# Patient Record
Sex: Female | Born: 1960 | Race: Black or African American | Hispanic: No | Smoking: Never smoker
Health system: Southern US, Community
[De-identification: ages and names within clinical notes are randomized; demographics above are authoritative.]

## PROBLEM LIST (undated history)

## (undated) DIAGNOSIS — G4730 Sleep apnea, unspecified: Secondary | ICD-10-CM

## (undated) DIAGNOSIS — I82409 Acute embolism and thrombosis of unspecified deep veins of unspecified lower extremity: Secondary | ICD-10-CM

## (undated) DIAGNOSIS — E119 Type 2 diabetes mellitus without complications: Secondary | ICD-10-CM

## (undated) DIAGNOSIS — R7689 Other specified abnormal immunological findings in serum: Secondary | ICD-10-CM

## (undated) DIAGNOSIS — D649 Anemia, unspecified: Secondary | ICD-10-CM

## (undated) DIAGNOSIS — I10 Essential (primary) hypertension: Secondary | ICD-10-CM

## (undated) DIAGNOSIS — Z8719 Personal history of other diseases of the digestive system: Secondary | ICD-10-CM

## (undated) DIAGNOSIS — I2699 Other pulmonary embolism without acute cor pulmonale: Secondary | ICD-10-CM

## (undated) DIAGNOSIS — M1990 Unspecified osteoarthritis, unspecified site: Secondary | ICD-10-CM

## (undated) DIAGNOSIS — M47816 Spondylosis without myelopathy or radiculopathy, lumbar region: Secondary | ICD-10-CM

## (undated) DIAGNOSIS — J45909 Unspecified asthma, uncomplicated: Secondary | ICD-10-CM

## (undated) DIAGNOSIS — M545 Low back pain, unspecified: Secondary | ICD-10-CM

## (undated) DIAGNOSIS — T884XXA Failed or difficult intubation, initial encounter: Secondary | ICD-10-CM

## (undated) DIAGNOSIS — K219 Gastro-esophageal reflux disease without esophagitis: Secondary | ICD-10-CM

## (undated) DIAGNOSIS — R768 Other specified abnormal immunological findings in serum: Secondary | ICD-10-CM

## (undated) DIAGNOSIS — T8859XA Other complications of anesthesia, initial encounter: Secondary | ICD-10-CM

## (undated) HISTORY — PX: HEEL SPUR SURGERY: SHX665

## (undated) HISTORY — PX: TUBAL LIGATION: SHX77

## (undated) HISTORY — PX: BREAST BIOPSY: SHX20

## (undated) HISTORY — PX: CHOLECYSTECTOMY: SHX55

## (undated) HISTORY — PX: TONSILLECTOMY: SUR1361

---

## 1998-12-03 HISTORY — PX: REDUCTION MAMMAPLASTY: SUR839

## 1998-12-03 HISTORY — PX: BREAST SURGERY: SHX581

## 2002-08-12 ENCOUNTER — Other Ambulatory Visit: Admission: RE | Admit: 2002-08-12 | Discharge: 2002-08-12 | Payer: Self-pay | Admitting: Internal Medicine

## 2002-10-28 ENCOUNTER — Encounter: Payer: Self-pay | Admitting: Family Medicine

## 2002-10-28 ENCOUNTER — Encounter: Admission: RE | Admit: 2002-10-28 | Discharge: 2002-10-28 | Payer: Self-pay | Admitting: Family Medicine

## 2003-10-18 ENCOUNTER — Other Ambulatory Visit: Admission: RE | Admit: 2003-10-18 | Discharge: 2003-10-18 | Payer: Self-pay | Admitting: Internal Medicine

## 2005-01-04 ENCOUNTER — Ambulatory Visit: Payer: Self-pay | Admitting: Internal Medicine

## 2005-01-04 ENCOUNTER — Other Ambulatory Visit: Admission: RE | Admit: 2005-01-04 | Discharge: 2005-01-04 | Payer: Self-pay | Admitting: Internal Medicine

## 2005-02-26 ENCOUNTER — Ambulatory Visit: Payer: Self-pay | Admitting: Internal Medicine

## 2006-06-14 ENCOUNTER — Emergency Department: Payer: Self-pay | Admitting: Emergency Medicine

## 2007-03-31 ENCOUNTER — Emergency Department: Payer: Self-pay | Admitting: Emergency Medicine

## 2007-10-08 ENCOUNTER — Ambulatory Visit: Payer: Self-pay | Admitting: Family Medicine

## 2007-12-04 HISTORY — PX: ROTATOR CUFF REPAIR: SHX139

## 2008-04-02 ENCOUNTER — Ambulatory Visit: Payer: Self-pay | Admitting: Orthopaedic Surgery

## 2008-04-23 ENCOUNTER — Encounter: Payer: Self-pay | Admitting: Internal Medicine

## 2010-01-31 ENCOUNTER — Emergency Department: Payer: Self-pay | Admitting: Emergency Medicine

## 2011-01-06 ENCOUNTER — Emergency Department: Payer: Self-pay | Admitting: Emergency Medicine

## 2011-07-10 ENCOUNTER — Ambulatory Visit: Payer: Self-pay | Admitting: Family Medicine

## 2012-04-09 ENCOUNTER — Inpatient Hospital Stay: Payer: Self-pay | Admitting: Internal Medicine

## 2012-04-09 LAB — COMPREHENSIVE METABOLIC PANEL
Albumin: 3.4 g/dL (ref 3.4–5.0)
Alkaline Phosphatase: 98 U/L (ref 50–136)
Anion Gap: 10 (ref 7–16)
BUN: 10 mg/dL (ref 7–18)
Bilirubin,Total: 0.5 mg/dL (ref 0.2–1.0)
Calcium, Total: 9 mg/dL (ref 8.5–10.1)
Chloride: 102 mmol/L (ref 98–107)
Co2: 29 mmol/L (ref 21–32)
Creatinine: 0.88 mg/dL (ref 0.60–1.30)
EGFR (African American): >60
EGFR (Non-African Amer.): >60
Glucose: 152 mg/dL — ABNORMAL HIGH (ref 65–99)
Osmolality: 283 (ref 275–301)
Potassium: 3.2 mmol/L — ABNORMAL LOW (ref 3.5–5.1)
SGOT(AST): 40 U/L — ABNORMAL HIGH (ref 15–37)
SGPT (ALT): 38 U/L
Sodium: 141 mmol/L (ref 136–145)
Total Protein: 8.1 g/dL (ref 6.4–8.2)

## 2012-04-09 LAB — CBC WITH DIFFERENTIAL/PLATELET
Basophil #: 0 10*3/uL (ref 0.0–0.1)
Basophil %: 0.2 %
Eosinophil #: 0 10*3/uL (ref 0.0–0.7)
Eosinophil %: 0 %
HCT: 34.9 % — ABNORMAL LOW (ref 35.0–47.0)
HGB: 10.9 g/dL — ABNORMAL LOW (ref 12.0–16.0)
Lymphocyte #: 1.1 10*3/uL (ref 1.0–3.6)
Lymphocyte %: 5.3 %
MCH: 27.4 pg (ref 26.0–34.0)
MCHC: 31.4 g/dL — ABNORMAL LOW (ref 32.0–36.0)
MCV: 87 fL (ref 80–100)
Monocyte #: 1.3 x10 3/mm — ABNORMAL HIGH (ref 0.2–0.9)
Monocyte %: 6.2 %
Neutrophil #: 18.2 10*3/uL — ABNORMAL HIGH (ref 1.4–6.5)
Neutrophil %: 88.3 %
Platelet: 232 10*3/uL (ref 150–440)
RBC: 3.99 10*6/uL (ref 3.80–5.20)
RDW: 16.2 % — ABNORMAL HIGH (ref 11.5–14.5)
WBC: 20.6 10*3/uL — ABNORMAL HIGH (ref 3.6–11.0)

## 2012-04-10 LAB — CBC WITH DIFFERENTIAL/PLATELET
Basophil #: 0.1 10*3/uL (ref 0.0–0.1)
Basophil %: 0.4 %
Eosinophil #: 0 10*3/uL (ref 0.0–0.7)
Eosinophil %: 0 %
HCT: 33.3 % — ABNORMAL LOW (ref 35.0–47.0)
HGB: 10.5 g/dL — ABNORMAL LOW (ref 12.0–16.0)
Lymphocyte #: 1.1 10*3/uL (ref 1.0–3.6)
Lymphocyte %: 4 %
MCH: 27.4 pg (ref 26.0–34.0)
MCHC: 31.5 g/dL — ABNORMAL LOW (ref 32.0–36.0)
MCV: 87 fL (ref 80–100)
Monocyte #: 0.7 x10 3/mm (ref 0.2–0.9)
Monocyte %: 2.4 %
Neutrophil #: 25.7 10*3/uL — ABNORMAL HIGH (ref 1.4–6.5)
Neutrophil %: 93.2 %
Platelet: 249 10*3/uL (ref 150–440)
RBC: 3.83 10*6/uL (ref 3.80–5.20)
RDW: 16.4 % — ABNORMAL HIGH (ref 11.5–14.5)
WBC: 27.5 10*3/uL — ABNORMAL HIGH (ref 3.6–11.0)

## 2012-04-10 LAB — BASIC METABOLIC PANEL
Anion Gap: 9 (ref 7–16)
BUN: 15 mg/dL (ref 7–18)
Calcium, Total: 8.9 mg/dL (ref 8.5–10.1)
Chloride: 105 mmol/L (ref 98–107)
Co2: 26 mmol/L (ref 21–32)
Creatinine: 0.85 mg/dL (ref 0.60–1.30)
EGFR (African American): >60
EGFR (Non-African Amer.): >60
Glucose: 164 mg/dL — ABNORMAL HIGH (ref 65–99)
Osmolality: 284 (ref 275–301)
Potassium: 3.8 mmol/L (ref 3.5–5.1)
Sodium: 140 mmol/L (ref 136–145)

## 2012-04-11 LAB — CBC WITH DIFFERENTIAL/PLATELET
Basophil #: 0 10*3/uL (ref 0.0–0.1)
Basophil %: 0.1 %
Eosinophil #: 0 10*3/uL (ref 0.0–0.7)
Eosinophil %: 0 %
HCT: 33.6 % — ABNORMAL LOW (ref 35.0–47.0)
HGB: 10.4 g/dL — ABNORMAL LOW (ref 12.0–16.0)
Lymphocyte #: 1.6 10*3/uL (ref 1.0–3.6)
Lymphocyte %: 6.7 %
MCH: 26.9 pg (ref 26.0–34.0)
MCHC: 31.1 g/dL — ABNORMAL LOW (ref 32.0–36.0)
MCV: 87 fL (ref 80–100)
Monocyte #: 0.6 x10 3/mm (ref 0.2–0.9)
Monocyte %: 2.7 %
Neutrophil #: 21.3 10*3/uL — ABNORMAL HIGH (ref 1.4–6.5)
Neutrophil %: 90.5 %
Platelet: 261 10*3/uL (ref 150–440)
RBC: 3.88 10*6/uL (ref 3.80–5.20)
RDW: 16.2 % — ABNORMAL HIGH (ref 11.5–14.5)
WBC: 23.5 10*3/uL — ABNORMAL HIGH (ref 3.6–11.0)

## 2012-06-04 ENCOUNTER — Ambulatory Visit: Payer: Self-pay | Admitting: Family Medicine

## 2012-07-03 ENCOUNTER — Ambulatory Visit: Payer: Self-pay | Admitting: Family Medicine

## 2012-07-10 ENCOUNTER — Ambulatory Visit: Payer: Self-pay | Admitting: Family Medicine

## 2012-08-03 ENCOUNTER — Ambulatory Visit: Payer: Self-pay | Admitting: Family Medicine

## 2012-08-21 ENCOUNTER — Emergency Department: Payer: Self-pay | Admitting: Emergency Medicine

## 2012-08-23 LAB — BETA STREP CULTURE(ARMC)

## 2012-09-17 ENCOUNTER — Ambulatory Visit: Payer: Self-pay | Admitting: Family Medicine

## 2012-10-03 ENCOUNTER — Ambulatory Visit: Payer: Self-pay | Admitting: Family Medicine

## 2012-11-19 ENCOUNTER — Ambulatory Visit: Payer: Self-pay | Admitting: Family Medicine

## 2012-12-03 ENCOUNTER — Ambulatory Visit: Payer: Self-pay | Admitting: Family Medicine

## 2013-02-18 ENCOUNTER — Ambulatory Visit: Payer: Self-pay | Admitting: Family Medicine

## 2013-03-03 ENCOUNTER — Ambulatory Visit: Payer: Self-pay | Admitting: Family Medicine

## 2013-04-02 ENCOUNTER — Ambulatory Visit: Payer: Self-pay | Admitting: Family Medicine

## 2013-04-20 ENCOUNTER — Emergency Department: Payer: Self-pay | Admitting: Emergency Medicine

## 2013-04-22 LAB — BETA STREP CULTURE(ARMC)

## 2013-05-04 ENCOUNTER — Ambulatory Visit: Payer: Self-pay | Admitting: Otolaryngology

## 2013-05-04 DIAGNOSIS — Z01810 Encounter for preprocedural cardiovascular examination: Secondary | ICD-10-CM

## 2013-05-04 LAB — BASIC METABOLIC PANEL
Anion Gap: 3 — ABNORMAL LOW (ref 7–16)
BUN: 12 mg/dL (ref 7–18)
Calcium, Total: 8.9 mg/dL (ref 8.5–10.1)
Chloride: 107 mmol/L (ref 98–107)
Co2: 30 mmol/L (ref 21–32)
Creatinine: 0.79 mg/dL (ref 0.60–1.30)
EGFR (African American): >60
EGFR (Non-African Amer.): >60
Glucose: 100 mg/dL — ABNORMAL HIGH (ref 65–99)
Osmolality: 279 (ref 275–301)
Potassium: 3.6 mmol/L (ref 3.5–5.1)
Sodium: 140 mmol/L (ref 136–145)

## 2013-05-04 LAB — HEMOGLOBIN: HGB: 11.8 g/dL — ABNORMAL LOW (ref 12.0–16.0)

## 2013-05-05 LAB — PATHOLOGY REPORT

## 2013-06-10 ENCOUNTER — Ambulatory Visit: Payer: Self-pay | Admitting: Family Medicine

## 2013-07-03 ENCOUNTER — Ambulatory Visit: Payer: Self-pay | Admitting: Family Medicine

## 2013-08-12 ENCOUNTER — Ambulatory Visit: Payer: Self-pay | Admitting: Otolaryngology

## 2013-09-22 ENCOUNTER — Ambulatory Visit: Payer: Self-pay | Admitting: Family Medicine

## 2013-10-19 ENCOUNTER — Ambulatory Visit: Payer: Self-pay | Admitting: Family Medicine

## 2013-11-02 ENCOUNTER — Ambulatory Visit: Payer: Self-pay | Admitting: Family Medicine

## 2013-12-03 ENCOUNTER — Ambulatory Visit: Payer: Self-pay | Admitting: Family Medicine

## 2013-12-07 DIAGNOSIS — G5600 Carpal tunnel syndrome, unspecified upper limb: Secondary | ICD-10-CM | POA: Insufficient documentation

## 2013-12-07 DIAGNOSIS — J45909 Unspecified asthma, uncomplicated: Secondary | ICD-10-CM | POA: Insufficient documentation

## 2014-01-03 ENCOUNTER — Ambulatory Visit: Payer: Self-pay | Admitting: Family Medicine

## 2014-02-12 ENCOUNTER — Ambulatory Visit: Payer: Self-pay | Admitting: Family Medicine

## 2014-03-03 ENCOUNTER — Ambulatory Visit: Payer: Self-pay | Admitting: Family Medicine

## 2014-06-22 ENCOUNTER — Ambulatory Visit: Payer: Self-pay | Admitting: Family Medicine

## 2014-07-03 ENCOUNTER — Ambulatory Visit: Payer: Self-pay | Admitting: Family Medicine

## 2014-08-03 ENCOUNTER — Ambulatory Visit: Payer: Self-pay | Admitting: Family Medicine

## 2014-09-02 ENCOUNTER — Ambulatory Visit: Payer: Self-pay | Admitting: Family Medicine

## 2014-10-07 ENCOUNTER — Ambulatory Visit: Payer: Self-pay | Admitting: Family Medicine

## 2014-11-02 ENCOUNTER — Ambulatory Visit: Payer: Self-pay | Admitting: Family Medicine

## 2014-12-13 ENCOUNTER — Ambulatory Visit: Payer: Self-pay | Admitting: Family Medicine

## 2015-01-03 ENCOUNTER — Ambulatory Visit: Payer: Self-pay | Admitting: Family Medicine

## 2015-01-15 DIAGNOSIS — I82409 Acute embolism and thrombosis of unspecified deep veins of unspecified lower extremity: Secondary | ICD-10-CM

## 2015-01-15 DIAGNOSIS — I2699 Other pulmonary embolism without acute cor pulmonale: Secondary | ICD-10-CM

## 2015-01-15 HISTORY — DX: Acute embolism and thrombosis of unspecified deep veins of unspecified lower extremity: I82.409

## 2015-01-15 HISTORY — DX: Other pulmonary embolism without acute cor pulmonale: I26.99

## 2015-03-25 NOTE — Op Note (Signed)
PATIENT NAME:  Erica Powers, Erica Powers MR#:  [id number] DATE OF BIRTH:  1961-11-08  DATE OF PROCEDURE:  05/04/2013  PREOPERATIVE DIAGNOSIS: History of chronic tonsillitis.   POSTOPERATIVE DIAGNOSIS: History of chronic tonsillitis.  PROCEDURE PERFORMED: Tonsillectomy, greater than age 54.   SURGEON: Bud Facereighton Keighley Deckman, M.D.   ANESTHESIA: General endotracheal anesthesia.   ESTIMATED BLOOD LOSS: 50 mL.   IV FLUIDS: Please see anesthesia record.   COMPLICATIONS: None.   DRAINS/STENT PLACEMENTS: None.   SPECIMENS: Right and left tonsil.   INDICATIONS FOR PROCEDURE: The patient is a 54 year old female with history of chronic tonsillitis and intermittent infections resistant to medical management.   OPERATIVE FINDINGS: Severe fibrosis and erythema of the tonsils bilaterally with moderate amount of fibrotic scarring to the underlying musculature.   DESCRIPTION OF PROCEDURE: After the patient was identified in holding, benefits and risks of the procedure were discussed and consent was reviewed, the patient was taken to the operating room and placed in the supine position. General endotracheal anesthesia was induced. The patient was rotated 45 degrees. A shoulder roll was placed and a McIvor mouth gag was inserted in the patient's oral cavity and suspended from Mayo stand. A red rubber catheter was placed in the patient's right nasal cavity for retraction of uvula and soft palate superiorly. At this time, visualization of the patient's tonsils was made. This demonstrated significantly hypertrophied tonsils with erythema. A curved Allis clamp was attached to the superior pole of the patient's right tonsil and this was retracted medially and inferiorly. There was a significant amount of granulation tissue and inflammation deep to that between the tonsil and the underlying musculature. There is no true subcapsular plane, but a plane was developed between inflammation tissue and the underlying musculature.  Meticulous hemostasis was achieved and the patient's right tonsil was excised and sent off for permanent pathological evaluation.   Attention was directed to the patient's left tonsil. After releasing the McIvor mouth gag for approximately 1 minute,  the patient's left tonsil was retracted medially and inferiorly with a curved Allis clamp. Again, severe inflammation was noted with inflammation tissue between the tonsil and the underlying musculature and Bovie electrocautery was used to meticulously dissect away from the underlying musculature. Bovie suction cautery was used for suctioning and meticulous hemostasis of the bilateral tonsillar beds. At this time, the patient was released from suspension for approximately 1 minute. Visualization of the patient's tonsillar fossa was made. Repeat cauterization was made in small areas of bleeding involving the left lateral tonsil and right inferior. Attention was directed to the patient's adenoid tissue. There was no significant adenoid tissue, but there was a narrow choana. Therefore, Vicryls were placed in between the posterior tonsillar beds and 2 Vicryl stitches were used to open up the patient's airway. At this time, care of the patient was transferred to anesthesia.   ____________________________ Kyung Ruddreighton C. Sesilia Poucher, MD ccv:aw D: 05/04/2013 10:11:25 ET T: 05/04/2013 10:55:55 ET JOB#: [id number]  cc: Kyung Ruddreighton C. Jahfari Ambers, MD, <Dictator> Kyung RuddREIGHTON C Shaquil Aldana MD ELECTRONICALLY SIGNED 05/14/2013 7:48

## 2015-03-25 NOTE — Discharge Summary (Signed)
Dates of Admission and Diagnosis:  Date of Admission 04-May-2013   Date of Discharge 05-May-2013   Admitting Diagnosis s/p tonsillectomy   Final Diagnosis s/p tonsillectomy   Discharge Diagnosis 1 s/p tonsillectomy    Chief Complaint/History of Present Illness 54 y.o. female with history of chronic tonsillitis and severe inflammation of tonsils admitted following tonsillectomy on 05/04/13.   Routine Chem:  02-Jun-14 08:42   Glucose, Serum  100  BUN 12  Creatinine (comp) 0.79  Sodium, Serum 140  Potassium, Serum 3.6  Chloride, Serum 107  CO2, Serum 30  Calcium (Total), Serum 8.9  Anion Gap  3  Osmolality (calc) 279  eGFR (African American) >60  eGFR (Non-African American) >60 (eGFR values <39m/min/1.73 m2 may be an indication of chronic kidney disease (CKD). Calculated eGFR is useful in patients with stable renal function. The eGFR calculation will not be reliable in acutely ill patients when serum creatinine is changing rapidly. It is not useful in  patients on dialysis. The eGFR calculation may not be applicable to patients at the low and high extremes of body sizes, pregnant women, and vegetarians.)  Routine Hem:  02-Jun-14 08:42   Hemoglobin (CBC)  11.8 ([facility]) [facility] following procedure for pain control and fluid hydration.  Tolerated diet well.  Ambulated.  Pain controlled with oral medications.   Condition on Discharge Good   DISCHARGE INSTRUCTIONS HOME MEDS:  Medication Reconciliation: Patient's Home Medications at Discharge:     Medication Instructions  phentermine 30 mg oral capsule  1 cap(s) orally once a day   hydrochlorothiazide 50 mg oral tablet  1 tab(s) orally once a day   oxycodone 5 mg/5 ml oral solution  10 milliliter(s) orally every 4 hours, As needed, pain   promethazine  12.5 milligram(s) PO every 4 hours, As Needed, nausea, vomiting , As needed, nausea,  vomiting   lidocaine topical  10 milliliter(s) orally every 6 hours, As needed, pain.  Swish and spit.    PRESCRIPTIONS: PRINTED AND PLACED ON CHART   Physician's Instructions:  Home Health? No   Treatments None   Home Oxygen? No   Diet Regular   Diet Consistency Mechanical Soft   Activity Limitations No exertional activity  No heavy lifting  for 2 weeks   Return to Work 2 weeks   Time frame for Follow Up Appointment follow up in 3 weeks with [facility]   Electronic Signatures: Odalis Jordan, CShela Leff(MD)  (Signed 03-Jun-14 07:31)  Authored: ADMISSION DATE AND DIAGNOSIS, CHIEF COMPLAINT/HPI, PERTINENT LLaurieMEDS, PATIENT INSTRUCTIONS   Last Updated: 03-Jun-14 07:31 by VPascal Lux(MD)

## 2015-03-27 NOTE — Consult Note (Signed)
Brief Consult Note: Diagnosis: Tonsillitis.   Patient was seen by consultant.   Consult note dictated.   Recommend further assessment or treatment.   Comments: 54 y.o. female presented to ED with 3 day history of sore throat progressing with some difficulty breathing.  Admitted from ED after given Rocephin and Solumedrol/Decadron.  Reports significant improvement with no problems when supine. CT- tonsillar hypertrophy, no fluid collection, airway patent PE: Gen- NAD, airway patent Ears- canals patent Nose- clear anteriorly, boggy mucosa, clear rhinorrhea OC/OP- tonsillar hypertrophy and erythema with exudate bilaterally, airway patent Neck- +LAD  Impression:  Tonsillitis Plan:  Agree with Solumedrol and Rocephin.  Switch to Augmentin and Sterapred DS 6 day on discharge.  Rec follow up with me as outpatient in 10-14 days for evaluation of tonsils to ensure resolution.  Patient may need tonsillectomy if this occurs again.  Electronic Signatures: Kyrus Hyde, Rayfield Citizenreighton Charles (MD)  (Signed [phone]-May-13 17:25)  Authored: Brief Consult Note   Last Updated: 08-May-13 17:25 by Flossie DibbleVaught, Daana Petrasek Charles (MD)

## 2015-03-27 NOTE — Consult Note (Signed)
PATIENT NAME:  Erica Powers, Erica Powers MR#:  [id number] DATE OF BIRTH:  [birth year]  DATE OF CONSULTATION:  04/09/2012  REFERRING PHYSICIAN:  Dr. Rudene Rearwish  CONSULTING PHYSICIAN:  Kyung Ruddreighton C. Sim Choquette, MD   REASON FOR CONSULTATION: Tonsillitis.   HISTORY OF PRESENT ILLNESS: The patient is a 54 year old female who presented to the Emergency Room on 04/09/2012 with a three day history of progressive odynophagia as well as increasing shortness of breath with laying supine. She was evaluated and noted to have extremely erythematous and enlarged tonsils. She underwent a CT scan which did not reveal any signs of abscess. She was admitted to the floor and underwent treatment with Rocephin as well as Solu-Medrol and reports that she has had significant improvement. She no longer has any respiratory problems laying supine. She is able to tolerate p.o. liquids well and her pain has significantly improved.   PAST MEDICAL HISTORY:  1. Hypertension.  2. Iron deficiency anemia.   PAST SURGICAL HISTORY:  1. Cesarean section x2. 2. Foot surgery.  3. Breast reduction.   SOCIAL HISTORY: The patient denies any significant tobacco or alcohol use.   FAMILY HISTORY: Congestive heart failure.   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   ALLERGIES: Codeine.   CURRENT MEDICATIONS: Reviewed and documented in the electronic medical record.     PHYSICAL EXAMINATION:   GENERAL: Well nourished, well developed female laying supine in no acute distress. There is no stridor.   EARS: EACs are clear.   NOSE: Clear anteriorly.   ORAL CAVITY AND OROPHARYNX: 3+ hypertrophied tonsils with mild erythema and exudate. Airway is widely patent.   NECK: Some mild lymphadenopathy bilaterally but no fluid collection or abscess.   RADIOLOGICAL DATA: CT scan is reviewed which revealed enlarged edematous tonsils but no fluid collection.   IMPRESSION: Tonsillitis and odynophagia with resolved respiratory  problems.   PLAN: I discussed my findings with the patient. I've recommended continuing the Rocephin as well as Solu-Medrol until tomorrow and then when she is discharged switch to Augmentin and oral Sterapred six day taper. I would like to see her in my office in 10 to 14 days for repeat evaluation.   ____________________________ Kyung Ruddreighton C. Philopateer Strine, MD ccv:drc D: 04/09/2012 18:23:42 ET T: 04/10/2012 09:54:17 ET JOB#: [id number]  cc: Kyung Ruddreighton C. Emerson Schreifels, MD, <Dictator> Kyung RuddREIGHTON C Rilley Stash MD ELECTRONICALLY SIGNED 04/11/2012 13:43

## 2015-03-27 NOTE — Discharge Summary (Signed)
PATIENT NAME:  Erica Powers, Erica Powers MR#:  [id number] DATE OF BIRTH:  1961/04/03  DATE OF ADMISSION:  04/09/2012 DATE OF DISCHARGE:  04/11/2012  PRIMARY CARE PHYSICIAN: [name], MD  FINAL DIAGNOSES:  1. Acute tonsillitis with dysphagia, difficulty speaking, and shortness of breath.  2. Hypertension.  3. Asthma.  4. Obesity.  5. Hypokalemia.   DISCHARGE MEDICATIONS: 1. Ferrous sulfate daily.  2. Advair Diskus 110/50 one inhalation twice a day.  3. ProAir 2 puffs every 4 to 6 hours.  4. Flonase two sprays daily.  5. Loratadine 10 mg daily.  6. Hydrochlorothiazide 12.5 mg daily.  7. Augmentin 875 mg twice a day until completed.  8. Prednisone taper as written on prescription until completed.  9. Ambien 10 mg p.o. nightly as needed for sleep.   DIET: Low sodium diet.   ACTIVITY:  As tolerated.   DISCHARGE FOLLOWUP: Followup with Dr. [name], ENT, in 1 to 2 weeks. Followup with Dr. [name] in 1 to 2 weeks.   REASON FOR ADMISSION: The patient was admitted 04/09/2012 and discharged 04/11/2012. She came in with severe throat pain, getting worse and associated with shortness of breath.  HISTORY OF PRESENT ILLNESS: This is a Erica Powers who had scratchy throat and fever, took some ibuprofen, increasing swelling in her neck. She was given a penicillin shot at her primary care physician's then had difficulty breathing. Hospitalist services were contacted for admission for severe tonsillitis, dysphagia, difficulty speaking, and shortness of breath. She also had leukocytosis and hypokalemia. She was given IV Rocephin and IV Solu-Medrol. ENT consultation was done by Dr. [name] who recommend a follow-up appointment in his office in 7 to 10 days.   LABORATORY/RADIOLOGIC DATA: Glucose 152, BUN 10, creatinine 0.88, sodium 141, potassium 3.2, chloride 102, CO2 29, and calcium 9.0. Liver function tests normal. White blood cell count 20.6, hemoglobin and hematocrit 10.9 and 34.9, and  platelet count 232.  CT scan of the neck showed edematous hyper-enhancing tonsillar pillars, may be infectious or inflammatory.   White blood cell count upon discharge 23.5.   HOSPITAL COURSE PER PROBLEM LIST:  1. For the acute tonsillitis with dysphagia and difficulty speaking and shortness of breath, this had improved. She was able to swallow. Her breathing got better with steroid treatment and Rocephin. The patient was discharged home on prednisone taper and Augmentin and followup with ENT as an outpatient.  2. For her hypotension, I lowered the dose on the hydrochlorothiazide. Blood pressure upon discharge was 136/70. 3. For asthma she is on her normal Advair and Pro Air. Steroids will help out with breathing anyway, but there was no wheezing during the hospital stay.  4. For her obesity, BMI is 43.3. Weight loss recommended.  5. For hypokalemia, this was replaced and can be worsened with the hydrochlorothiazide.             DISPOSITION: The patient was discharged home in stable condition. The patient did still have swelling there and a whitish exudate on the tonsil, but able to breathe well and swallow okay. The patient is discharged home.   TIME SPENT ON DISCHARGE: 35 minutes.  ____________________________ Herschell Powers J. Erica GlossWieting, MD rjw:slb D: 04/11/2012 16:33:55 ET T: 04/14/2012 12:45:42 ET JOB#: [id number]  cc: Herschell Powers J. Erica GlossWieting, MD, <Dictator> Erica "Sallie" Allena KatzPatel, MD Erica Ruddreighton C. Vaught, MD Erica ScarletICHARD J Erica Gallo MD ELECTRONICALLY SIGNED 04/20/2012 14:41

## 2015-03-27 NOTE — H&P (Signed)
PATIENT NAME:  Erica Powers, Erica Powers MR#:  [id number] DATE OF BIRTH:  05-Feb-1961  DATE OF ADMISSION:  04/09/2012  PRIMARY CARE PHYSICIAN: Tillman Abideichard Letvak, MD  CHIEF COMPLAINT: Severe sore throat that is getting worse and associated with shortness of breath.   HISTORY OF PRESENT ILLNESS: Erica Powers is a 54 year old pleasant African American female who was in her usual state of health until yesterday when she developed scratchy throat associated with fever. She took ibuprofen and then when she woke up later she felt what she described as real bad in terms of increasing pain and swelling in her neck. She decided to go to her primary care physician and she reported she was given a penicillin shot and she went home. However, at 10 o'clock at night, her situation worsened, and at this point she developed difficulty to breathe in that she had to keep her head backwards in order to breathe. That is to keep the head in extension. She reported chills and hot feeling. The patient came to the emergency department and was admitted for further treatment.   REVIEW OF SYSTEMS: CONSTITUTIONAL: Reports fever and chills. No fatigue. EYES: No blurring of vision. No double vision. ENT: No hearing impairment, but she has severe sore throat, dysphagia, and odynophagia. CARDIOVASCULAR: No chest pain, but she developed shortness of breath after the severe sore throat. No syncope. RESPIRATORY: No cough. No sputum production. No chest pain. No hemoptysis. GASTROINTESTINAL: No abdominal pain. No nausea, no vomiting, and no diarrhea. GENITOURINARY: No dysuria. No frequency of urination. MUSCULOSKELETAL: No joint pain or swelling. No muscular pain or swelling. INTEGUMENTARY: No skin rash. No ulcers. NEUROLOGY: No focal weakness. No seizure activity. No headache. PSYCHIATRY: No anxiety or depression. ENDOCRINE: No polyuria or polydipsia. No heat or cold intolerance.   PAST MEDICAL HISTORY:  1. Systemic hypertension. 2. Iron deficiency  anemia.   PAST SURGICAL HISTORY:  1. Cesarean section. 2. Foot spur surgery.   SOCIAL HABITS: Nonsmoker. No history of alcohol or drug abuse.   FAMILY HISTORY: Her mother died from complications of congestive heart failure. Her father died at an old age with no specific medical problems.   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   ADMISSION MEDICATIONS:  1. Hydrochlorothiazide 1 tablet a day.  2. Ibuprofen p.r.n.   ALLERGIES: Codeine causes itching.   PHYSICAL EXAMINATION:   VITAL SIGNS: Blood pressure 140/70, respiratory rate 18, pulse 74, temperature 98.5, and oxygen saturation 98%.   GENERAL APPEARANCE: Middle-aged female lying in bed in no acute distress. She started to feel better after initial treatment in the emergency department.  HEAD/NECK: No pallor. No icterus. No cyanosis.  EARS, NOSE, AND THROAT:  Hearing was normal. Nasal mucosa, lips, and tongue were normal. The posterior pharyngeal area shows soft tissue swelling and extensive exudate in the posterior pharyngeal area. The uvula is at midline.   NECK: Supple. Trachea at midline. No thyromegaly. No cervical lymphadenopathy. No masses.   HEART: Normal S1 and S2. No S3 or S4. No murmur. No gallop. No carotid bruits.   RESPIRATORY: Normal breathing pattern without use of accessory muscles. No rales. No wheezing.   ABDOMEN: Soft without tenderness. No hepatosplenomegaly. No masses. No hernias.   SKIN: No ulcers. No subcutaneous nodules.   MUSCULOSKELETAL: No joint swelling. No clubbing.   NEUROLOGIC: Cranial nerves II through XII are intact. No focal motor deficit.   PSYCHIATRY: The patient is alert and oriented x3. Mood and affect were normal.  LABS/STUDIES: CAT scan of the neck revealed enlarged and edematous palatine tonsils consistent with tonsillitis. No drainable abscess formation.   CBC showed a white count of 20,000, hemoglobin 10, hematocrit 34, and platelet count 232. Liver  function tests were normal, except for slight elevation of AST. Serum glucose 152, BUN 10, creatinine 0.8, sodium 141, and potassium 3.2.   ASSESSMENT AND PLAN:  1. Severe tonsillitis. 2. Dysphagia and odynophagia secondary to soft tissue neck swelling. 3. Increased shortness of breath in association with acute tonsillitis and soft tissue swelling narrowing airway passages.  4. Leukocytosis secondary to the infection.  5. Hypokalemia.  6. Noted elevated blood sugar. The patient may have underlying diabetes. This needs to be monitored since steroid was initiated.  7. Systemic hypertension.   PLAN: The patient will be admitted. Check vital signs more frequently. IV Solu-Medrol to reduce the soft tissue inflammatory response. IV antibiotic; she received Zosyn in the emergency department. I will change that to Rocephin 1 gram daily. ENT consult was obtained. Tylenol p.r.n. Monitor blood sugar while the patient is on steroids. Potassium replacement.   TIME SPENT IN EVALUATING THIS PATIENT: More than 45 minutes.  ____________________________ Carney Corners. Rudene Re, MD amd:slb D: 04/09/2012 06:50:23 ET     T: 04/09/2012 09:37:27 ET        JOB#: [id number] cc: Carney Corners. Rudene Re, MD, <Dictator> Karie Schwalbe, MD Karolee Ohs Dala Dock MD ELECTRONICALLY SIGNED 04/09/2012 22:20

## 2015-03-30 ENCOUNTER — Encounter: Payer: Self-pay | Admitting: *Deleted

## 2015-04-07 NOTE — Discharge Instructions (Signed)

## 2015-04-08 ENCOUNTER — Ambulatory Visit
Admission: RE | Admit: 2015-04-08 | Discharge: 2015-04-08 | Disposition: A | Discharge status: Home / Self Care | Payer: BLUE CROSS/BLUE SHIELD | Source: Ambulatory Visit | Attending: Gastroenterology | Admitting: Gastroenterology

## 2015-04-08 ENCOUNTER — Ambulatory Visit: Payer: BLUE CROSS/BLUE SHIELD | Admitting: Anesthesiology

## 2015-04-08 ENCOUNTER — Other Ambulatory Visit: Payer: Self-pay | Admitting: Gastroenterology

## 2015-04-08 ENCOUNTER — Encounter
Admission: RE | Disposition: A | Discharge status: Home / Self Care | Payer: Self-pay | Source: Ambulatory Visit | Attending: Gastroenterology

## 2015-04-08 DIAGNOSIS — Z9889 Other specified postprocedural states: Secondary | ICD-10-CM | POA: Insufficient documentation

## 2015-04-08 DIAGNOSIS — Z79899 Other long term (current) drug therapy: Secondary | ICD-10-CM | POA: Diagnosis not present

## 2015-04-08 DIAGNOSIS — J45909 Unspecified asthma, uncomplicated: Secondary | ICD-10-CM | POA: Insufficient documentation

## 2015-04-08 DIAGNOSIS — K5730 Diverticulosis of large intestine without perforation or abscess without bleeding: Secondary | ICD-10-CM | POA: Insufficient documentation

## 2015-04-08 DIAGNOSIS — K640 First degree hemorrhoids: Secondary | ICD-10-CM | POA: Insufficient documentation

## 2015-04-08 DIAGNOSIS — K635 Polyp of colon: Secondary | ICD-10-CM | POA: Insufficient documentation

## 2015-04-08 DIAGNOSIS — Z1211 Encounter for screening for malignant neoplasm of colon: Secondary | ICD-10-CM | POA: Diagnosis not present

## 2015-04-08 HISTORY — PX: COLONOSCOPY: SHX5424

## 2015-04-08 HISTORY — DX: Unspecified asthma, uncomplicated: J45.909

## 2015-04-08 SURGERY — COLONOSCOPY
Anesthesia: Monitor Anesthesia Care | Wound class: Contaminated

## 2015-04-08 MED ORDER — ACETAMINOPHEN 160 MG/5ML PO SOLN: 325.0000 mg | ORAL | Status: DC | PRN

## 2015-04-08 MED ORDER — LACTATED RINGERS IV SOLN
INTRAVENOUS | Status: DC
  Administered 2015-04-08: 08:00:00 via INTRAVENOUS

## 2015-04-08 MED ORDER — LACTATED RINGERS IV SOLN: INTRAVENOUS | Status: DC

## 2015-04-08 MED ORDER — LIDOCAINE HCL (CARDIAC) 20 MG/ML IV SOLN
INTRAVENOUS | Status: DC | PRN
  Administered 2015-04-08: 50 mg via INTRAVENOUS

## 2015-04-08 MED ORDER — STERILE WATER FOR IRRIGATION IR SOLN
Status: DC | PRN
  Administered 2015-04-08: 09:00:00

## 2015-04-08 MED ORDER — PROPOFOL 10 MG/ML IV BOLUS
INTRAVENOUS | Status: DC | PRN
  Administered 2015-04-08: 50 mg via INTRAVENOUS
  Administered 2015-04-08: 20 mg via INTRAVENOUS
  Administered 2015-04-08: 50 mg via INTRAVENOUS
  Administered 2015-04-08: 30 mg via INTRAVENOUS
  Administered 2015-04-08: 20 mg via INTRAVENOUS
  Administered 2015-04-08: 50 mg via INTRAVENOUS

## 2015-04-08 MED ORDER — SODIUM CHLORIDE 0.9 % IV SOLN: INTRAVENOUS | Status: DC

## 2015-04-08 MED ORDER — ACETAMINOPHEN 325 MG PO TABS: 325.0000 mg | ORAL_TABLET | ORAL | Status: DC | PRN

## 2015-04-08 SURGICAL SUPPLY — 27 items
CANISTER SUCT 1200ML W/VALVE (MISCELLANEOUS) ×3 IMPLANT
FCP ESCP3.2XJMB 240X2.8X (MISCELLANEOUS) ×1
FORCEPS BIOP RAD 4 LRG CAP 4 (CUTTING FORCEPS) IMPLANT
FORCEPS BIOP RJ4 240 W/NDL (MISCELLANEOUS) ×3
FORCEPS ESCP3.2XJMB 240X2.8X (MISCELLANEOUS) IMPLANT
GOWN CVR UNV OPN BCK APRN NK (MISCELLANEOUS) ×2 IMPLANT
GOWN ISOL THUMB LOOP REG UNIV (MISCELLANEOUS) ×6
HEMOCLIP INSTINCT (CLIP) IMPLANT
INJECTOR VARIJECT VIN23 (MISCELLANEOUS) IMPLANT
KIT CO2 TUBING (TUBING) ×3 IMPLANT
KIT DEFENDO VALVE AND CONN (KITS) IMPLANT
KIT ENDO PROCEDURE OLY (KITS) ×3 IMPLANT
LIGATOR MULTIBAND 6SHOOTER MBL (MISCELLANEOUS) IMPLANT
MARKER SPOT ENDO TATTOO 5ML (MISCELLANEOUS) IMPLANT
PAD GROUND ADULT SPLIT (MISCELLANEOUS) IMPLANT
SNARE SHORT THROW 13M SML OVAL (MISCELLANEOUS) IMPLANT
SNARE SHORT THROW 30M LRG OVAL (MISCELLANEOUS) IMPLANT
SPOT EX ENDOSCOPIC TATTOO (MISCELLANEOUS)
SUCTION POLY TRAP 4CHAMBER (MISCELLANEOUS) IMPLANT
TRAP SUCTION POLY (MISCELLANEOUS) IMPLANT
TUBING CONN 6MMX3.1M (TUBING)
TUBING SUCTION CONN 0.25 STRL (TUBING) IMPLANT
UNDERPAD 30X60 958B10 (PK) (MISCELLANEOUS) IMPLANT
VALVE BIOPSY ENDO (VALVE) IMPLANT
VARIJECT INJECTOR VIN23 (MISCELLANEOUS)
WATER AUXILLARY (MISCELLANEOUS) IMPLANT
WATER STERILE IRR 500ML POUR (IV SOLUTION) ×3 IMPLANT

## 2015-04-08 NOTE — Anesthesia Preprocedure Evaluation (Signed)
Anesthesia Evaluation  Patient identified by MRN, date of birth, ID band Patient awake    Reviewed: Allergy & Precautions, H&P , NPO status , Patient's Chart, lab work & pertinent test results, reviewed documented beta blocker date and time   Airway Mallampati: II  TM Distance: >3 FB Neck ROM: full    Dental no notable dental hx.    Pulmonary asthma ,  breath sounds clear to auscultation  Pulmonary exam normal       Cardiovascular Exercise Tolerance: Good negative cardio ROS  Rhythm:regular Rate:Normal     Neuro/Psych negative neurological ROS  negative psych ROS   GI/Hepatic negative GI ROS, Neg liver ROS,   Endo/Other  Morbid obesity  Renal/GU negative Renal ROS  negative genitourinary   Musculoskeletal   Abdominal   Peds  Hematology negative hematology ROS (+)   Anesthesia Other Findings   Reproductive/Obstetrics negative OB ROS                             Anesthesia Physical Anesthesia Plan  ASA: II  Anesthesia Plan: MAC   Post-op Pain Management:    Induction:   Airway Management Planned:   Additional Equipment:   Intra-op Plan:   Post-operative Plan:   Informed Consent: I have reviewed the patients History and Physical, chart, labs and discussed the procedure including the risks, benefits and alternatives for the proposed anesthesia with the patient or authorized representative who has indicated his/her understanding and acceptance.   Dental Advisory Given  Plan Discussed with: CRNA  Anesthesia Plan Comments:         Anesthesia Quick Evaluation

## 2015-04-08 NOTE — Transfer of Care (Signed)
Immediate Anesthesia Transfer of Care Note  Patient: Erica Powers  Procedure(s) Performed: Procedure(s): COLONOSCOPY (N/A)  Patient Location: PACU  Anesthesia Type: MAC  Level of Consciousness: awake, alert  and patient cooperative  Airway and Oxygen Therapy: Patient Spontanous Breathing and Patient connected to supplemental oxygen  Post-op Assessment: Post-op Vital signs reviewed, Patient's Cardiovascular Status Stable, Respiratory Function Stable, Patent Airway and No signs of Nausea or vomiting  Post-op Vital Signs: Reviewed and stable  Complications: No apparent anesthesia complications

## 2015-04-08 NOTE — Anesthesia Procedure Notes (Signed)
Procedure Name: MAC Performed by: Zykiria Bruening Pre-anesthesia Checklist: Patient identified, Emergency Drugs available, Suction available, Timeout performed and Patient being monitored Patient Re-evaluated:Patient Re-evaluated prior to inductionOxygen Delivery Method: Nasal cannula Placement Confirmation: positive ETCO2     

## 2015-04-08 NOTE — Anesthesia Postprocedure Evaluation (Signed)
  Anesthesia Post-op Note  Patient: Erica Powers  Procedure(s) Performed: Procedure(s): COLONOSCOPY (N/A)  Anesthesia type:MAC  Patient location: PACU  Post pain: Pain level controlled  Post assessment: Post-op Vital signs reviewed, Patient's Cardiovascular Status Stable, Respiratory Function Stable, Patent Airway and No signs of Nausea or vomiting  Post vital signs: Reviewed and stable  Last Vitals:  Filed Vitals:   04/08/15 0901  BP: 108/67  Pulse:   Temp:   Resp: 18    Level of consciousness: awake, alert  and patient cooperative  Complications: No apparent anesthesia complications

## 2015-04-08 NOTE — H&P (Signed)
@  [email or web address]  Primary Care Physician:  Auburn BilberryPATEL, SHREYANG, MD Primary Gastroenterologist:  Dr. Servando SnareWohl  Pre-Procedure History & Physical: HPI:  Erica LeeVeronica L Powers is a 54 y.o. female is here for an colonoscopy.   Past Medical History  Diagnosis Date  . Asthma     no issues over 1 yr    Past Surgical History  Procedure Laterality Date  . Heel spur surgery Bilateral   . Breast surgery  2000    Reduction  . Rotator cuff repair  2009  . Tonsillectomy    . Tubal ligation      Prior to Admission medications   Medication Sig Start Date End Date Taking? Authorizing Provider  hydrochlorothiazide (HYDRODIURIL) 50 MG tablet Take 50 mg by mouth daily. AM   Yes Historical Provider, MD  Multiple Vitamins-Minerals (MULTIVITAMIN PO) Take by mouth daily. AM   Yes Historical Provider, MD  naproxen (NAPROSYN) 500 MG tablet Take 500 mg by mouth daily. AM   Yes Historical Provider, MD    Allergies as of 03/24/2015  . (Not on File)    History reviewed. No pertinent family history.  History   Social History  . Marital Status: Divorced    Spouse Name: N/A  . Number of Children: N/A  . Years of Education: N/A   Occupational History  . Not on file.   Social History Main Topics  . Smoking status: Never Smoker   . Smokeless tobacco: Not on file  . Alcohol Use: 0.6 oz/week    1 Glasses of wine per week  . Drug Use: Not on file  . Sexual Activity: Not on file   Other Topics Concern  . Not on file   Social History Narrative  . No narrative on file    Review of Systems: See HPI, otherwise negative ROS  Physical Exam: BP 113/78 mmHg  Pulse 64  Temp(Src) 97 F (36.1 C) (Temporal)  Resp 16  Ht 5\' 5"  (1.651 m)  Wt 255 lb (115.667 kg)  BMI 42.43 kg/m2  SpO2 100% General:   Alert,  pleasant and cooperative in NAD Head:  Normocephalic and atraumatic. Neck:  Supple; no masses or thyromegaly. Lungs:  Clear throughout to auscultation.    Heart:  Regular rate and rhythm. Abdomen:  Soft,  nontender and nondistended. Normal bowel sounds, without guarding, and without rebound.   Neurologic:  Alert and  oriented x4;  grossly normal neurologically.  Impression/Plan: Erica Powers is here for an colonoscopy to be performed for Screening  Risks, benefits, limitations, and alternatives regarding  colonoscopy have been reviewed with the patient.  Questions have been answered.  All parties agreeable.   [facility]OHL,Kamoni Gentles, MD  04/08/2015, 8:23 AM

## 2015-04-08 NOTE — Op Note (Signed)
[facility] Gastroenterology Patient Name: Erica ArmsVeronica Powers Procedure Date: 04/08/2015 8:10 AM MRN: [id number] Account #: [id number] Date of Birth: 03/27/1961 Admit Type: Outpatient Age: 54 [facility] Gender: Female Note Status: Finalized Procedure:         Colonoscopy Indications:       Screening for colorectal malignant neoplasm Providers:         Midge Miniumarren Konnor Vondrasek, MD Medicines:         Propofol per Anesthesia Complications:     No immediate complications. Procedure:         Pre-Anesthesia Assessment:                    - Prior to the procedure, a History and Physical was                     performed, and patient medications and allergies were                     reviewed. The patient's tolerance of previous anesthesia                     was also reviewed. The risks and benefits of the procedure                     and the sedation options and risks were discussed with the                     patient. All questions were answered, and informed consent                     was obtained. Prior Anticoagulants: The patient has taken                     no previous anticoagulant or antiplatelet agents. ASA                     Grade Assessment: II - A patient with mild systemic                     disease. After reviewing the risks and benefits, the                     patient was deemed in satisfactory condition to undergo                     the procedure.                    After obtaining informed consent, the colonoscope was                     passed under direct vision. Throughout the procedure, the                     patient's blood pressure, pulse, and oxygen saturations                     were monitored continuously. The Olympus CF H180AL                     colonoscope (S#: [id number]) was introduced through the anus                     and advanced to the the cecum, identified by  appendiceal                     orifice and ileocecal valve. The  colonoscopy was performed                     without difficulty. The patient tolerated the procedure                     well. The quality of the bowel preparation was excellent. Findings:      The perianal and digital rectal examinations were normal.      Seven sessile polyps were found in the sigmoid colon. The polyps were 3       to 5 mm in size. These polyps were removed with a cold biopsy forceps.       Resection and retrieval were complete.      A few small-mouthed diverticula were found in the sigmoid colon.      Non-bleeding internal hemorrhoids were found during retroflexion. The       hemorrhoids were Grade I (internal hemorrhoids that do not prolapse). Impression:        - Seven 3 to 5 mm polyps in the sigmoid colon. Resected                     and retrieved.                    - Diverticulosis in the sigmoid colon.                    - Non-bleeding internal hemorrhoids. Recommendation:    - Repeat colonoscopy in 5 years if polyp adenoma and 10                     years if hyperplastic Procedure Code(s): --- Professional ---                    [phone], Colonoscopy, flexible; with biopsy, single or                     multiple Diagnosis Code(s): --- Professional ---                    Z12.11, Encounter for screening for malignant neoplasm of                     colon                    D12.5, Benign neoplasm of sigmoid colon CPT copyright 2014 American Medical Association. All rights reserved. The codes documented in this report are preliminary and upon coder review may  be revised to meet current compliance requirements. Midge Miniumarren Kimoni Pagliarulo, MD 04/08/2015 8:42:59 AM This report has been signed electronically. Number of Addenda: 0 Note Initiated On: 04/08/2015 8:10 AM Scope Withdrawal Time: 0 hours 6 minutes 51 seconds  Total Procedure Duration: 0 hours 9 minutes 13 seconds       [facility]

## 2015-04-12 ENCOUNTER — Encounter: Payer: Self-pay | Admitting: Gastroenterology

## 2015-06-15 DIAGNOSIS — I10 Essential (primary) hypertension: Secondary | ICD-10-CM | POA: Insufficient documentation

## 2015-06-21 ENCOUNTER — Other Ambulatory Visit: Payer: Self-pay | Admitting: Bariatrics

## 2015-06-21 DIAGNOSIS — I10 Essential (primary) hypertension: Secondary | ICD-10-CM

## 2015-06-22 ENCOUNTER — Telehealth: Payer: Self-pay | Admitting: Gastroenterology

## 2015-06-22 NOTE — Telephone Encounter (Signed)
LVM for patient to call back and schedule an appointment for consultation for EGD

## 2015-06-24 ENCOUNTER — Other Ambulatory Visit: Payer: Self-pay

## 2015-06-27 ENCOUNTER — Ambulatory Visit
Admission: RE | Admit: 2015-06-27 | Discharge: 2015-06-27 | Disposition: A | Discharge status: Home / Self Care | Payer: BLUE CROSS/BLUE SHIELD | Source: Ambulatory Visit | Attending: Bariatrics | Admitting: Bariatrics

## 2015-06-27 DIAGNOSIS — I10 Essential (primary) hypertension: Secondary | ICD-10-CM | POA: Insufficient documentation

## 2015-06-27 DIAGNOSIS — Z9049 Acquired absence of other specified parts of digestive tract: Secondary | ICD-10-CM | POA: Diagnosis not present

## 2015-06-27 DIAGNOSIS — K449 Diaphragmatic hernia without obstruction or gangrene: Secondary | ICD-10-CM | POA: Insufficient documentation

## 2015-06-30 ENCOUNTER — Encounter: Payer: Self-pay | Admitting: *Deleted

## 2015-07-06 NOTE — Discharge Instructions (Signed)

## 2015-07-07 ENCOUNTER — Ambulatory Visit: Payer: BLUE CROSS/BLUE SHIELD | Admitting: Anesthesiology

## 2015-07-07 ENCOUNTER — Ambulatory Visit
Admission: RE | Admit: 2015-07-07 | Discharge: 2015-07-07 | Disposition: A | Discharge status: Home / Self Care | Payer: BLUE CROSS/BLUE SHIELD | Source: Ambulatory Visit | Attending: Gastroenterology | Admitting: Gastroenterology

## 2015-07-07 ENCOUNTER — Encounter
Admission: RE | Disposition: A | Discharge status: Home / Self Care | Payer: Self-pay | Source: Ambulatory Visit | Attending: Gastroenterology

## 2015-07-07 ENCOUNTER — Encounter: Payer: Self-pay | Admitting: *Deleted

## 2015-07-07 DIAGNOSIS — I10 Essential (primary) hypertension: Secondary | ICD-10-CM | POA: Diagnosis not present

## 2015-07-07 DIAGNOSIS — Z791 Long term (current) use of non-steroidal anti-inflammatories (NSAID): Secondary | ICD-10-CM | POA: Insufficient documentation

## 2015-07-07 DIAGNOSIS — J45909 Unspecified asthma, uncomplicated: Secondary | ICD-10-CM | POA: Diagnosis not present

## 2015-07-07 DIAGNOSIS — Z9889 Other specified postprocedural states: Secondary | ICD-10-CM | POA: Diagnosis not present

## 2015-07-07 DIAGNOSIS — K316 Fistula of stomach and duodenum: Secondary | ICD-10-CM | POA: Diagnosis not present

## 2015-07-07 DIAGNOSIS — Z6841 Body Mass Index (BMI) 40.0 and over, adult: Secondary | ICD-10-CM | POA: Insufficient documentation

## 2015-07-07 DIAGNOSIS — Z79899 Other long term (current) drug therapy: Secondary | ICD-10-CM | POA: Diagnosis not present

## 2015-07-07 DIAGNOSIS — Z01818 Encounter for other preprocedural examination: Secondary | ICD-10-CM | POA: Diagnosis not present

## 2015-07-07 DIAGNOSIS — Z885 Allergy status to narcotic agent status: Secondary | ICD-10-CM | POA: Diagnosis not present

## 2015-07-07 HISTORY — PX: ESOPHAGOGASTRODUODENOSCOPY (EGD) WITH PROPOFOL: SHX5813

## 2015-07-07 HISTORY — DX: Essential (primary) hypertension: I10

## 2015-07-07 SURGERY — ESOPHAGOGASTRODUODENOSCOPY (EGD) WITH PROPOFOL
Anesthesia: Monitor Anesthesia Care | Wound class: Clean Contaminated

## 2015-07-07 MED ORDER — OXYCODONE HCL 5 MG/5ML PO SOLN: 5.0000 mg | Freq: Once | ORAL | Status: DC | PRN

## 2015-07-07 MED ORDER — OXYCODONE HCL 5 MG PO TABS: 5.0000 mg | ORAL_TABLET | Freq: Once | ORAL | Status: DC | PRN

## 2015-07-07 MED ORDER — GLYCOPYRROLATE 0.2 MG/ML IJ SOLN
INTRAMUSCULAR | Status: DC | PRN
  Administered 2015-07-07: 0.2 mg via INTRAVENOUS

## 2015-07-07 MED ORDER — SODIUM CHLORIDE 0.9 % IV SOLN: INTRAVENOUS | Status: DC

## 2015-07-07 MED ORDER — LACTATED RINGERS IV SOLN
INTRAVENOUS | Status: DC
  Administered 2015-07-07: 09:00:00 via INTRAVENOUS

## 2015-07-07 MED ORDER — PROPOFOL 10 MG/ML IV BOLUS
INTRAVENOUS | Status: DC | PRN
  Administered 2015-07-07: 100 mg via INTRAVENOUS
  Administered 2015-07-07 (×2): 50 mg via INTRAVENOUS

## 2015-07-07 MED ORDER — LIDOCAINE HCL (CARDIAC) 20 MG/ML IV SOLN
INTRAVENOUS | Status: DC | PRN
  Administered 2015-07-07: 40 mg via INTRAVENOUS

## 2015-07-07 SURGICAL SUPPLY — 39 items
BALLN DILATOR 10-12 8 (BALLOONS)
BALLN DILATOR 12-15 8 (BALLOONS)
BALLN DILATOR 15-18 8 (BALLOONS)
BALLN DILATOR CRE 0-12 8 (BALLOONS)
BALLN DILATOR ESOPH 8 10 CRE (MISCELLANEOUS) IMPLANT
BALLOON DILATOR 12-15 8 (BALLOONS) IMPLANT
BALLOON DILATOR 15-18 8 (BALLOONS) IMPLANT
BALLOON DILATOR CRE 0-12 8 (BALLOONS) IMPLANT
BLOCK BITE 60FR ADLT L/F GRN (MISCELLANEOUS) ×3 IMPLANT
CANISTER SUCT 1200ML W/VALVE (MISCELLANEOUS) ×3 IMPLANT
FCP ESCP3.2XJMB 240X2.8X (MISCELLANEOUS)
FORCEPS BIOP RAD 4 LRG CAP 4 (CUTTING FORCEPS) IMPLANT
FORCEPS BIOP RJ4 240 W/NDL (MISCELLANEOUS)
FORCEPS ESCP3.2XJMB 240X2.8X (MISCELLANEOUS) IMPLANT
GOWN CVR UNV OPN BCK APRN NK (MISCELLANEOUS) ×2 IMPLANT
GOWN ISOL THUMB LOOP REG UNIV (MISCELLANEOUS) ×6
HEMOCLIP INSTINCT (CLIP) IMPLANT
INJECTOR VARIJECT VIN23 (MISCELLANEOUS) IMPLANT
KIT CO2 TUBING (TUBING) IMPLANT
KIT DEFENDO VALVE AND CONN (KITS) IMPLANT
KIT ENDO PROCEDURE OLY (KITS) ×3 IMPLANT
LIGATOR MULTIBAND 6SHOOTER MBL (MISCELLANEOUS) IMPLANT
MARKER SPOT ENDO TATTOO 5ML (MISCELLANEOUS) IMPLANT
PAD GROUND ADULT SPLIT (MISCELLANEOUS) IMPLANT
SNARE SHORT THROW 13M SML OVAL (MISCELLANEOUS) IMPLANT
SNARE SHORT THROW 30M LRG OVAL (MISCELLANEOUS) IMPLANT
SPOT EX ENDOSCOPIC TATTOO (MISCELLANEOUS)
SUCTION POLY TRAP 4CHAMBER (MISCELLANEOUS) IMPLANT
SYR INFLATION 60ML (SYRINGE) IMPLANT
TRAP SUCTION POLY (MISCELLANEOUS) IMPLANT
TUBING CONN 6MMX3.1M (TUBING)
TUBING SUCTION CONN 0.25 STRL (TUBING) IMPLANT
UNDERPAD 30X60 958B10 (PK) (MISCELLANEOUS) IMPLANT
VALVE BIOPSY ENDO (VALVE) IMPLANT
VARIJECT INJECTOR VIN23 (MISCELLANEOUS)
WATER AUXILLARY (MISCELLANEOUS) IMPLANT
WATER STERILE IRR 250ML POUR (IV SOLUTION) ×3 IMPLANT
WATER STERILE IRR 500ML POUR (IV SOLUTION) IMPLANT
WIRE CRE 18-20MM 8CM F G (MISCELLANEOUS) IMPLANT

## 2015-07-07 NOTE — Op Note (Signed)
[facility] Gastroenterology Patient Name: Erica Powers Procedure Date: 07/07/2015 8:41 AM MRN: [id number] Account #: [id number] Date of Birth: 05-Mar-1961 Admit Type: Outpatient Age: 54 Room: [facility] OR ROOM 01 Gender: Female Note Status: Finalized Procedure:         Upper GI endoscopy Indications:       Preoperative assessment for bariatric surgery to treat                     morbid obesity Providers:         Midge Minium, MD Referring MD:      Lacie Scotts. Allena Katz (Referring MD) Medicines:         Propofol per Anesthesia Complications:     No immediate complications. Procedure:         Pre-Anesthesia Assessment:                    - Prior to the procedure, a History and Physical was                     performed, and patient medications and allergies were                     reviewed. The patient's tolerance of previous anesthesia                     was also reviewed. The risks and benefits of the procedure                     and the sedation options and risks were discussed with the                     patient. All questions were answered, and informed consent                     was obtained. Prior Anticoagulants: The patient has taken                     no previous anticoagulant or antiplatelet agents. ASA                     Grade Assessment: II - A patient with mild systemic                     disease. After reviewing the risks and benefits, the                     patient was deemed in satisfactory condition to undergo                     the procedure.                    After obtaining informed consent, the endoscope was passed                     under direct vision. Throughout the procedure, the                     patient's blood pressure, pulse, and oxygen saturations                     were monitored continuously. The Olympus GIF H180J  colonscope (Z#:[id number]) was introduced through the mouth,                     and advanced to  the second part of duodenum. The upper GI                     endoscopy was accomplished without difficulty. The patient                     tolerated the procedure well. Findings:      The examined esophagus was normal.      Gastro-gastric fistula      The examined duodenum was normal. Impression:        - Normal esophagus.                    - Gastro-gastric fistula                    - Normal examined duodenum.                    - No specimens collected. Recommendation:    - The findings and recommendations were discussed with the                     surgeon. Procedure Code(s): --- Professional ---                    [phone], Esophagogastroduodenoscopy, flexible, transoral;                     diagnostic, including collection of specimen(s) by                     brushing or washing, when performed (separate procedure) Diagnosis Code(s): --- Professional ---                    J19.147, Encounter for other preprocedural examination                    E66.01, Morbid (severe) obesity due to excess calories CPT copyright 2014 American Medical Association. All rights reserved. The codes documented in this report are preliminary and upon coder review may  be revised to meet current compliance requirements. Midge Minium, MD 07/07/2015 8:59:47 AM This report has been signed electronically. Number of Addenda: 0 Note Initiated On: 07/07/2015 8:41 AM Total Procedure Duration: 0 hours 4 minutes 31 seconds       [facility]

## 2015-07-07 NOTE — H&P (Signed)
  [facility]  [address], [address] [place], [place] [address] Phone: [phone] Fax : [phone]  Primary Care Physician:  Pcp Not In System Primary Gastroenterologist:  Dr. Servando Snare  Pre-Procedure History & Physical: HPI:  Erica Powers is a 54 y.o. female is here for an endoscopy.   Past Medical History  Diagnosis Date  . Asthma     no issues over 1 yr  . Hypertension     Past Surgical History  Procedure Laterality Date  . Heel spur surgery Bilateral   . Breast surgery  2000    Reduction  . Rotator cuff repair  2009  . Tonsillectomy    . Tubal ligation    . Colonoscopy N/A 04/08/2015    Procedure: COLONOSCOPY;  Surgeon: Midge Minium, MD;  Location: [facility];  Service: Gastroenterology;  Laterality: N/A;    Prior to Admission medications   Medication Sig Start Date End Date Taking? Authorizing Provider  hydrochlorothiazide (HYDRODIURIL) 50 MG tablet Take 50 mg by mouth daily. AM    Historical Provider, MD  Multiple Vitamins-Minerals (MULTIVITAMIN PO) Take by mouth daily. AM    Historical Provider, MD  naproxen (NAPROSYN) 500 MG tablet Take 500 mg by mouth daily. AM    Historical Provider, MD    Allergies as of 06/24/2015 - Review Complete 04/08/2015  Allergen Reaction Noted  . Codeine Other (See Comments) 06/24/2015  . Hydrocodone Itching 03/30/2015    History reviewed. No pertinent family history.  History   Social History  . Marital Status: Divorced    Spouse Name: N/A  . Number of Children: N/A  . Years of Education: N/A   Occupational History  . Not on file.   Social History Main Topics  . Smoking status: Never Smoker   . Smokeless tobacco: Not on file  . Alcohol Use: 0.6 oz/week    1 Glasses of wine per week  . Drug Use: Not on file  . Sexual Activity: Not on file   Other Topics Concern  . Not on file   Social History Narrative    Review of Systems: See HPI, otherwise negative ROS  Physical Exam: Ht  (1.651  m)  Wt 255 lb (115.667 kg)  BMI 42.43 kg/m2 General:   Alert,  pleasant and cooperative in NAD Head:  Normocephalic and atraumatic. Neck:  Supple; no masses or thyromegaly. Lungs:  Clear throughout to auscultation.    Heart:  Regular rate and rhythm. Abdomen:  Soft, nontender and nondistended. Normal bowel sounds, without guarding, and without rebound.   Neurologic:  Alert and  oriented x4;  grossly normal neurologically.  Impression/Plan: Erica Powers is here for an endoscopy to be performed for preop for bariatric suergery  Risks, benefits, limitations, and alternatives regarding  endoscopy have been reviewed with the patient.  Questions have been answered.  All parties agreeable.   Darlina Rumpf, MD  07/07/2015, 8:14 AM

## 2015-07-07 NOTE — Anesthesia Postprocedure Evaluation (Signed)
  Anesthesia Post-op Note  Patient: Erica Powers  Procedure(s) Performed: Procedure(s): ESOPHAGOGASTRODUODENOSCOPY (EGD) WITH PROPOFOL (N/A)  Anesthesia type:MAC  Patient location: PACU  Post pain: Pain level controlled  Post assessment: Post-op Vital signs reviewed, Patient's Cardiovascular Status Stable, Respiratory Function Stable, Patent Airway and No signs of Nausea or vomiting  Post vital signs: Reviewed and stable  Last Vitals:  Filed Vitals:   07/07/15 0910  BP: 118/82  Pulse: 67  Temp:   Resp: 15    Level of consciousness: awake, alert  and patient cooperative  Complications: No apparent anesthesia complications

## 2015-07-07 NOTE — Anesthesia Preprocedure Evaluation (Signed)
Anesthesia Evaluation  Patient identified by MRN, date of birth, ID band  Reviewed: NPO status   History of Anesthesia Complications Negative for: history of anesthetic complications  Airway Mallampati: II  TM Distance: >3 FB Neck ROM: full    Dental no notable dental hx.  Gap in front top teeth:   Pulmonary asthma ,    Pulmonary exam normal       Cardiovascular Exercise Tolerance: Good hypertension, Normal cardiovascular exam    Neuro/Psych negative neurological ROS  negative psych ROS   GI/Hepatic negative GI ROS, Neg liver ROS,   Endo/Other  Morbid obesity  Renal/GU negative Renal ROS  negative genitourinary   Musculoskeletal   Abdominal   Peds  Hematology negative hematology ROS (+)   Anesthesia Other Findings   Reproductive/Obstetrics negative OB ROS                             Anesthesia Physical Anesthesia Plan  ASA: II  Anesthesia Plan: MAC   Post-op Pain Management:    Induction:   Airway Management Planned:   Additional Equipment:   Intra-op Plan:   Post-operative Plan:   Informed Consent: I have reviewed the patients History and Physical, chart, labs and discussed the procedure including the risks, benefits and alternatives for the proposed anesthesia with the patient or authorized representative who has indicated his/her understanding and acceptance.     Plan Discussed with: CRNA  Anesthesia Plan Comments:         Anesthesia Quick Evaluation

## 2015-07-07 NOTE — Anesthesia Procedure Notes (Signed)
Procedure Name: MAC Performed by: Jermari Tamargo Pre-anesthesia Checklist: Patient identified, Emergency Drugs available, Suction available, Timeout performed and Patient being monitored Patient Re-evaluated:Patient Re-evaluated prior to inductionOxygen Delivery Method: Nasal cannula Placement Confirmation: positive ETCO2     

## 2015-07-07 NOTE — Transfer of Care (Signed)
Immediate Anesthesia Transfer of Care Note  Patient: Erica Powers  Procedure(s) Performed: Procedure(s): ESOPHAGOGASTRODUODENOSCOPY (EGD) WITH PROPOFOL (N/A)  Patient Location: PACU  Anesthesia Type: MAC  Level of Consciousness: awake, alert  and patient cooperative  Airway and Oxygen Therapy: Patient Spontanous Breathing and Patient connected to supplemental oxygen  Post-op Assessment: Post-op Vital signs reviewed, Patient's Cardiovascular Status Stable, Respiratory Function Stable, Patent Airway and No signs of Nausea or vomiting  Post-op Vital Signs: Reviewed and stable  Complications: No apparent anesthesia complications

## 2015-07-08 ENCOUNTER — Encounter: Payer: Self-pay | Admitting: Gastroenterology

## 2015-07-13 ENCOUNTER — Ambulatory Visit: Payer: BLUE CROSS/BLUE SHIELD | Admitting: Gastroenterology

## 2015-07-13 DIAGNOSIS — M1990 Unspecified osteoarthritis, unspecified site: Secondary | ICD-10-CM | POA: Insufficient documentation

## 2015-07-13 DIAGNOSIS — E119 Type 2 diabetes mellitus without complications: Secondary | ICD-10-CM | POA: Insufficient documentation

## 2015-09-07 ENCOUNTER — Other Ambulatory Visit: Payer: Self-pay | Admitting: Orthopedic Surgery

## 2015-09-07 DIAGNOSIS — M2392 Unspecified internal derangement of left knee: Secondary | ICD-10-CM

## 2015-09-07 DIAGNOSIS — M1712 Unilateral primary osteoarthritis, left knee: Secondary | ICD-10-CM

## 2015-09-16 ENCOUNTER — Ambulatory Visit
Admission: RE | Admit: 2015-09-16 | Discharge: 2015-09-16 | Disposition: A | Discharge status: Home / Self Care | Payer: BLUE CROSS/BLUE SHIELD | Source: Ambulatory Visit | Attending: Orthopedic Surgery | Admitting: Orthopedic Surgery

## 2015-09-16 DIAGNOSIS — M1712 Unilateral primary osteoarthritis, left knee: Secondary | ICD-10-CM | POA: Insufficient documentation

## 2015-09-16 DIAGNOSIS — M2392 Unspecified internal derangement of left knee: Secondary | ICD-10-CM

## 2015-09-27 DIAGNOSIS — M1712 Unilateral primary osteoarthritis, left knee: Secondary | ICD-10-CM | POA: Insufficient documentation

## 2015-09-27 DIAGNOSIS — M1711 Unilateral primary osteoarthritis, right knee: Secondary | ICD-10-CM | POA: Insufficient documentation

## 2015-10-21 ENCOUNTER — Other Ambulatory Visit: Payer: BLUE CROSS/BLUE SHIELD

## 2015-10-24 ENCOUNTER — Other Ambulatory Visit: Payer: BLUE CROSS/BLUE SHIELD

## 2015-10-25 ENCOUNTER — Encounter
Admission: RE | Admit: 2015-10-25 | Discharge: 2015-10-25 | Disposition: A | Discharge status: Home / Self Care | Payer: BLUE CROSS/BLUE SHIELD | Source: Ambulatory Visit | Attending: Bariatrics | Admitting: Bariatrics

## 2015-10-25 DIAGNOSIS — Z01812 Encounter for preprocedural laboratory examination: Secondary | ICD-10-CM | POA: Diagnosis not present

## 2015-10-25 HISTORY — DX: Personal history of other diseases of the digestive system: Z87.19

## 2015-10-25 HISTORY — DX: Gastro-esophageal reflux disease without esophagitis: K21.9

## 2015-10-25 LAB — BASIC METABOLIC PANEL
ANION GAP: 8 (ref 5–15)
BUN: 29 mg/dL — ABNORMAL HIGH (ref 6–20)
CO2: 32 mmol/L (ref 22–32)
Calcium: 9.7 mg/dL (ref 8.9–10.3)
Chloride: 98 mmol/L — ABNORMAL LOW (ref 101–111)
Creatinine, Ser: 0.87 mg/dL (ref 0.44–1.00)
GFR calc Af Amer: >60 mL/min (ref >60)
GFR calc non Af Amer: >60 mL/min (ref >60)
Glucose, Bld: 89 mg/dL (ref 65–99)
POTASSIUM: 3.1 mmol/L — AB (ref 3.5–5.1)
Sodium: 138 mmol/L (ref 135–145)

## 2015-10-25 LAB — CBC
HEMATOCRIT: 38.4 % (ref 35.0–47.0)
Hemoglobin: 12.4 g/dL (ref 12.0–16.0)
MCH: 28.4 pg (ref 26.0–34.0)
MCHC: 32.2 g/dL (ref 32.0–36.0)
MCV: 88.2 fL (ref 80.0–100.0)
Platelets: 257 10*3/uL (ref 150–440)
RBC: 4.36 MIL/uL (ref 3.80–5.20)
RDW: 14.9 % — ABNORMAL HIGH (ref 11.5–14.5)
WBC: 11.2 10*3/uL — ABNORMAL HIGH (ref 3.6–11.0)

## 2015-10-25 LAB — ABO/RH: ABO/RH(D): A POS

## 2015-10-25 LAB — TYPE AND SCREEN
ABO/RH(D): A POS
Antibody Screen: NEGATIVE

## 2015-10-25 NOTE — Patient Instructions (Signed)
  Your procedure is scheduled on: Tuesday 11/01/2015 Report to Day Surgery. 2ND FLOOR MEDICAL MALL ENTRANCE To find out your arrival time please call [phone] between 1PM - 3PM on Monday 10/31/2015.  Remember: Instructions that are not followed completely may result in serious medical risk, up to and including death, or upon the discretion of your surgeon and anesthesiologist your surgery may need to be rescheduled.    __X__ 1. Do not eat food or drink liquids after midnight. No gum chewing or hard candies.     __X__ 2. No Alcohol for 24 hours before or after surgery.   ____ 3. Bring all medications with you on the day of surgery if instructed.    __X__ 4. Notify your doctor if there is any change in your medical condition     (cold, fever, infections).     Do not wear jewelry, make-up, hairpins, clips or nail polish.  Do not wear lotions, powders, or perfumes.   Do not shave 48 hours prior to surgery. Men may shave face and neck.  Do not bring valuables to the hospital.    [facility] is not responsible for any belongings or valuables.               Contacts, dentures or bridgework may not be worn into surgery.  Leave your suitcase in the car. After surgery it may be brought to your room.  For patients admitted to the hospital, discharge time is determined by your                treatment team.   Patients discharged the day of surgery will not be allowed to drive home.   Please read over the following fact sheets that you were given:   MRSA Information and Surgical Site Infection Prevention   ____ Take these medicines the morning of surgery with A SIP OF WATER:    1.   2.   3.   4.  5.  6.  ____ Fleet Enema (as directed)   __X__ Use CHG Soap as directed  ____ Use inhalers on the day of surgery  ____ Stop metformin 2 days prior to surgery    ____ Take 1/2 of usual insulin dose the night before surgery and none on the morning of surgery.   ____ Stop  Coumadin/Plavix/aspirin on   ____ Stop Anti-inflammatories on    ____ Stop supplements until after surgery.    __X__ Bring C-Pap to the hospital.

## 2015-10-26 NOTE — OR Nursing (Signed)
Kt 3.1 called to Dr Alva Garnetyner and re[peat am surgery ordered

## 2015-10-31 MED ORDER — SODIUM CHLORIDE 0.9 % IJ SOLN
INTRAMUSCULAR | Status: AC
  Filled 2015-10-31: qty 3

## 2015-11-01 ENCOUNTER — Encounter: Payer: Self-pay | Admitting: *Deleted

## 2015-11-01 ENCOUNTER — Ambulatory Visit: Payer: BLUE CROSS/BLUE SHIELD | Admitting: Anesthesiology

## 2015-11-01 ENCOUNTER — Encounter
Admission: RE | Disposition: A | Discharge status: Home / Self Care | Payer: Self-pay | Source: Ambulatory Visit | Attending: Bariatrics

## 2015-11-01 ENCOUNTER — Inpatient Hospital Stay
Admission: RE | Admit: 2015-11-01 | Discharge: 2015-11-04 | DRG: 621 | Disposition: A | Discharge status: Home / Self Care | Payer: BLUE CROSS/BLUE SHIELD | Source: Ambulatory Visit | Attending: Bariatrics | Admitting: Bariatrics

## 2015-11-01 DIAGNOSIS — Z6841 Body Mass Index (BMI) 40.0 and over, adult: Secondary | ICD-10-CM

## 2015-11-01 DIAGNOSIS — J45909 Unspecified asthma, uncomplicated: Secondary | ICD-10-CM | POA: Diagnosis present

## 2015-11-01 DIAGNOSIS — K449 Diaphragmatic hernia without obstruction or gangrene: Secondary | ICD-10-CM | POA: Diagnosis present

## 2015-11-01 DIAGNOSIS — Z79899 Other long term (current) drug therapy: Secondary | ICD-10-CM

## 2015-11-01 DIAGNOSIS — K660 Peritoneal adhesions (postprocedural) (postinfection): Secondary | ICD-10-CM | POA: Diagnosis present

## 2015-11-01 DIAGNOSIS — Z885 Allergy status to narcotic agent status: Secondary | ICD-10-CM | POA: Diagnosis not present

## 2015-11-01 DIAGNOSIS — Z9884 Bariatric surgery status: Secondary | ICD-10-CM

## 2015-11-01 DIAGNOSIS — R509 Fever, unspecified: Secondary | ICD-10-CM

## 2015-11-01 DIAGNOSIS — D72829 Elevated white blood cell count, unspecified: Secondary | ICD-10-CM | POA: Diagnosis not present

## 2015-11-01 DIAGNOSIS — R0902 Hypoxemia: Secondary | ICD-10-CM | POA: Diagnosis present

## 2015-11-01 DIAGNOSIS — R5082 Postprocedural fever: Secondary | ICD-10-CM

## 2015-11-01 DIAGNOSIS — J189 Pneumonia, unspecified organism: Secondary | ICD-10-CM

## 2015-11-01 DIAGNOSIS — K289 Gastrojejunal ulcer, unspecified as acute or chronic, without hemorrhage or perforation: Secondary | ICD-10-CM

## 2015-11-01 DIAGNOSIS — K219 Gastro-esophageal reflux disease without esophagitis: Secondary | ICD-10-CM | POA: Diagnosis present

## 2015-11-01 HISTORY — PX: GASTRIC ROUX-EN-Y: SHX5262

## 2015-11-01 HISTORY — DX: Failed or difficult intubation, initial encounter: T88.4XXA

## 2015-11-01 LAB — HEMOGLOBIN AND HEMATOCRIT, BLOOD
HCT: 37.9 % (ref 35.0–47.0)
Hemoglobin: 11.9 g/dL — ABNORMAL LOW (ref 12.0–16.0)

## 2015-11-01 LAB — POCT I-STAT 4, (NA,K, GLUC, HGB,HCT)
GLUCOSE: 91 mg/dL (ref 65–99)
HEMATOCRIT: 42 % (ref 36.0–46.0)
Hemoglobin: 14.3 g/dL (ref 12.0–15.0)
POTASSIUM: 4.1 mmol/L (ref 3.5–5.1)
Sodium: 138 mmol/L (ref 135–145)

## 2015-11-01 SURGERY — LAPAROSCOPIC ROUX-EN-Y GASTRIC
Anesthesia: General | Wound class: Clean Contaminated

## 2015-11-01 MED ORDER — UNJURY CHICKEN SOUP POWDER
2.0000 [oz_av] | Freq: Four times a day (QID) | ORAL | Status: DC
  Administered 2015-11-01 – 2015-11-04 (×9): 2 [oz_av] via ORAL

## 2015-11-01 MED ORDER — ENOXAPARIN SODIUM 30 MG/0.3ML ~~LOC~~ SOLN
30.0000 mg | Freq: Two times a day (BID) | SUBCUTANEOUS | Status: DC
  Administered 2015-11-02 – 2015-11-04 (×4): 30 mg via SUBCUTANEOUS
  Filled 2015-11-01 (×4): qty 0.3

## 2015-11-01 MED ORDER — IPRATROPIUM-ALBUTEROL 0.5-2.5 (3) MG/3ML IN SOLN
3.0000 mL | Freq: Once | RESPIRATORY_TRACT | Status: AC
  Administered 2015-11-01: 3 mL via RESPIRATORY_TRACT

## 2015-11-01 MED ORDER — ACETAMINOPHEN 10 MG/ML IV SOLN
INTRAVENOUS | Status: DC | PRN
  Administered 2015-11-01: 1000 mg via INTRAVENOUS

## 2015-11-01 MED ORDER — NEOSTIGMINE METHYLSULFATE 10 MG/10ML IV SOLN
INTRAVENOUS | Status: DC | PRN
  Administered 2015-11-01: 4 mg via INTRAVENOUS

## 2015-11-01 MED ORDER — HYDROMORPHONE HCL 1 MG/ML IJ SOLN
1.0000 mg | INTRAMUSCULAR | Status: DC | PRN
  Administered 2015-11-01: 1 mg via INTRAVENOUS
  Filled 2015-11-01: qty 1

## 2015-11-01 MED ORDER — CETYLPYRIDINIUM CHLORIDE 0.05 % MT LIQD
7.0000 mL | Freq: Two times a day (BID) | OROMUCOSAL | Status: DC
Start: 2015-11-01 — End: 2015-11-04
  Administered 2015-11-01 – 2015-11-04 (×6): 7 mL via OROMUCOSAL

## 2015-11-01 MED ORDER — OXYCODONE HCL 5 MG/5ML PO SOLN
5.0000 mg | ORAL | Status: DC | PRN
Start: 2015-11-01 — End: 2015-11-04
  Administered 2015-11-01 – 2015-11-04 (×9): 10 mg via ORAL
  Filled 2015-11-01 (×10): qty 10

## 2015-11-01 MED ORDER — MIDAZOLAM HCL 2 MG/2ML IJ SOLN
INTRAMUSCULAR | Status: DC | PRN
Start: 2015-11-01 — End: 2015-11-01
  Administered 2015-11-01: 2 mg via INTRAVENOUS

## 2015-11-01 MED ORDER — PANTOPRAZOLE SODIUM 40 MG IV SOLR
40.0000 mg | Freq: Every day | INTRAVENOUS | Status: DC
  Administered 2015-11-01 – 2015-11-03 (×3): 40 mg via INTRAVENOUS
  Filled 2015-11-01 (×3): qty 40

## 2015-11-01 MED ORDER — ACETAMINOPHEN 160 MG/5ML PO SOLN: 650.0000 mg | ORAL | Status: DC | PRN

## 2015-11-01 MED ORDER — LIDOCAINE HCL (CARDIAC) 20 MG/ML IV SOLN
INTRAVENOUS | Status: DC | PRN
  Administered 2015-11-01: 40 mg via INTRAVENOUS

## 2015-11-01 MED ORDER — HYDROMORPHONE HCL 1 MG/ML IJ SOLN: 0.2500 mg | INTRAMUSCULAR | Status: DC | PRN

## 2015-11-01 MED ORDER — FENTANYL CITRATE (PF) 100 MCG/2ML IJ SOLN
INTRAMUSCULAR | Status: DC | PRN
  Administered 2015-11-01 (×3): 50 ug via INTRAVENOUS
  Administered 2015-11-01: 100 ug via INTRAVENOUS
  Administered 2015-11-01 (×3): 50 ug via INTRAVENOUS

## 2015-11-01 MED ORDER — CEFAZOLIN SODIUM 1-5 GM-% IV SOLN
INTRAVENOUS | Status: AC
  Filled 2015-11-01: qty 50

## 2015-11-01 MED ORDER — ENALAPRILAT 1.25 MG/ML IV SOLN
1.2500 mg | Freq: Four times a day (QID) | INTRAVENOUS | Status: DC | PRN
  Filled 2015-11-01: qty 1

## 2015-11-01 MED ORDER — CEFAZOLIN SODIUM 1-5 GM-% IV SOLN: 1.0000 g | Freq: Once | INTRAVENOUS | Status: DC

## 2015-11-01 MED ORDER — CEFAZOLIN (ANCEF) 1 G IV SOLR: 3.0000 g | INTRAVENOUS | Status: DC

## 2015-11-01 MED ORDER — DIPHENHYDRAMINE HCL 50 MG/ML IJ SOLN
25.0000 mg | Freq: Four times a day (QID) | INTRAMUSCULAR | Status: DC | PRN
  Administered 2015-11-02: 25 mg via INTRAVENOUS
  Filled 2015-11-01: qty 1

## 2015-11-01 MED ORDER — DEXAMETHASONE SODIUM PHOSPHATE 4 MG/ML IJ SOLN
INTRAMUSCULAR | Status: DC | PRN
  Administered 2015-11-01: 5 mg via INTRAVENOUS

## 2015-11-01 MED ORDER — ONDANSETRON HCL 4 MG/2ML IJ SOLN
INTRAMUSCULAR | Status: DC | PRN
  Administered 2015-11-01: 4 mg via INTRAVENOUS

## 2015-11-01 MED ORDER — ONDANSETRON HCL 4 MG/2ML IJ SOLN
4.0000 mg | Freq: Once | INTRAMUSCULAR | Status: AC
  Administered 2015-11-01: 4 mg via INTRAVENOUS

## 2015-11-01 MED ORDER — BUPIVACAINE-EPINEPHRINE (PF) 0.25% -1:200000 IJ SOLN
INTRAMUSCULAR | Status: AC
Start: 2015-11-01 — End: 2015-11-01
  Filled 2015-11-01: qty 60

## 2015-11-01 MED ORDER — SODIUM CHLORIDE 0.9 % IJ SOLN
INTRAMUSCULAR | Status: AC
  Filled 2015-11-01: qty 3

## 2015-11-01 MED ORDER — PREMIER PROTEIN SHAKE
2.0000 [oz_av] | Freq: Four times a day (QID) | ORAL | Status: DC
Start: 2015-11-03 — End: 2015-11-01

## 2015-11-01 MED ORDER — BUPIVACAINE-EPINEPHRINE (PF) 0.25% -1:200000 IJ SOLN
INTRAMUSCULAR | Status: DC | PRN
  Administered 2015-11-01: 60 mL

## 2015-11-01 MED ORDER — DEXTROSE 5 % IV SOLN
3.0000 g | INTRAVENOUS | Status: AC
  Administered 2015-11-01: 3 g via INTRAVENOUS
  Filled 2015-11-01: qty 3000

## 2015-11-01 MED ORDER — ROCURONIUM BROMIDE 100 MG/10ML IV SOLN
INTRAVENOUS | Status: DC | PRN
  Administered 2015-11-01 (×2): 20 mg via INTRAVENOUS
  Administered 2015-11-01: 10 mg via INTRAVENOUS
  Administered 2015-11-01: 50 mg via INTRAVENOUS
  Administered 2015-11-01: 10 mg via INTRAVENOUS

## 2015-11-01 MED ORDER — LACTATED RINGERS IV SOLN
INTRAVENOUS | Status: DC
  Administered 2015-11-01 (×2): via INTRAVENOUS

## 2015-11-01 MED ORDER — IPRATROPIUM-ALBUTEROL 0.5-2.5 (3) MG/3ML IN SOLN
RESPIRATORY_TRACT | Status: AC
  Filled 2015-11-01: qty 3

## 2015-11-01 MED ORDER — PROPOFOL 10 MG/ML IV BOLUS
INTRAVENOUS | Status: DC | PRN
Start: 2015-11-01 — End: 2015-11-01
  Administered 2015-11-01: 160 mg via INTRAVENOUS

## 2015-11-01 MED ORDER — ONDANSETRON HCL 4 MG/2ML IJ SOLN
INTRAMUSCULAR | Status: AC
  Filled 2015-11-01: qty 2

## 2015-11-01 MED ORDER — INFLUENZA VAC SPLIT QUAD 0.5 ML IM SUSY
0.5000 mL | PREFILLED_SYRINGE | INTRAMUSCULAR | Status: AC
  Administered 2015-11-02: 0.5 mL via INTRAMUSCULAR
  Filled 2015-11-01 (×2): qty 0.5

## 2015-11-01 MED ORDER — FENTANYL CITRATE (PF) 100 MCG/2ML IJ SOLN: 25.0000 ug | INTRAMUSCULAR | Status: DC | PRN

## 2015-11-01 MED ORDER — KCL IN DEXTROSE-NACL 20-5-0.45 MEQ/L-%-% IV SOLN
INTRAVENOUS | Status: DC
  Administered 2015-11-01 – 2015-11-03 (×4): via INTRAVENOUS
  Administered 2015-11-03: 1 mL via INTRAVENOUS
  Administered 2015-11-04: 06:00:00 via INTRAVENOUS
  Filled 2015-11-01 (×12): qty 1000

## 2015-11-01 MED ORDER — SCOPOLAMINE 1 MG/3DAYS TD PT72
MEDICATED_PATCH | TRANSDERMAL | Status: AC
  Filled 2015-11-01: qty 1

## 2015-11-01 MED ORDER — ACETAMINOPHEN 10 MG/ML IV SOLN
INTRAVENOUS | Status: AC
  Filled 2015-11-01: qty 100

## 2015-11-01 MED ORDER — GLYCOPYRROLATE 0.2 MG/ML IJ SOLN
INTRAMUSCULAR | Status: DC | PRN
  Administered 2015-11-01: .8 mg via INTRAVENOUS

## 2015-11-01 MED ORDER — ONDANSETRON HCL 4 MG/2ML IJ SOLN
4.0000 mg | INTRAMUSCULAR | Status: DC | PRN
  Administered 2015-11-01 – 2015-11-03 (×5): 4 mg via INTRAVENOUS
  Filled 2015-11-01 (×6): qty 2

## 2015-11-01 MED ORDER — SCOPOLAMINE 1 MG/3DAYS TD PT72
1.0000 | MEDICATED_PATCH | TRANSDERMAL | Status: DC
  Administered 2015-11-01: 1.5 mg via TRANSDERMAL

## 2015-11-01 SURGICAL SUPPLY — 63 items
APPLIER CLIP 5 13 M/L LIGAMAX5 (MISCELLANEOUS)
APPLIER CLIP ROT 13.4 12 LRG (CLIP)
APR CLP LRG 13.4X12 ROT 20 MLT (CLIP)
APR CLP MED LRG 5 ANG JAW (MISCELLANEOUS)
BANDAGE ELASTIC 6 LF NS (GAUZE/BANDAGES/DRESSINGS) ×6 IMPLANT
BLADE SURG SZ11 CARB STEEL (BLADE) ×3 IMPLANT
BNDG CMPR MED 5X6 ELC HKLP NS (GAUZE/BANDAGES/DRESSINGS) ×2
BULB RESERV EVAC DRAIN JP 100C (MISCELLANEOUS) ×2 IMPLANT
CANISTER SUCT 1200ML W/VALVE (MISCELLANEOUS) ×3 IMPLANT
CATH FOL LEG HOLDER (MISCELLANEOUS) ×2 IMPLANT
CATH TRAY 16F METER LATEX (MISCELLANEOUS) ×3 IMPLANT
CHLORAPREP W/TINT 26ML (MISCELLANEOUS) ×6 IMPLANT
CLIP APPLIE 5 13 M/L LIGAMAX5 (MISCELLANEOUS) ×1 IMPLANT
CLIP APPLIE ROT 13.4 12 LRG (CLIP) ×1 IMPLANT
DEFOGGER SCOPE WARMER CLEARIFY (MISCELLANEOUS) ×3 IMPLANT
DRAIN CHANNEL JP 19F (MISCELLANEOUS) ×2 IMPLANT
DRAPE UTILITY 15X26 TOWEL STRL (DRAPES) ×6 IMPLANT
FILTER LAP SMOKE EVAC STRL (MISCELLANEOUS) ×3 IMPLANT
GAUZE SPONGE 4X4 12PLY STRL (GAUZE/BANDAGES/DRESSINGS) ×2 IMPLANT
GLOVE BIO SURGEON STRL SZ7 (GLOVE) ×6 IMPLANT
GLOVE BIOGEL PI IND STRL 8.5 (GLOVE) ×1 IMPLANT
GLOVE BIOGEL PI INDICATOR 8.5 (GLOVE) ×2
GLOVE SURG SYN 8.0 (GLOVE) ×3 IMPLANT
GLOVE SURG SYN 8.0 PF PI (GLOVE) ×1 IMPLANT
GOWN STRL REUS W/ TWL LRG LVL3 (GOWN DISPOSABLE) ×3 IMPLANT
GOWN STRL REUS W/ TWL XL LVL3 (GOWN DISPOSABLE) ×2 IMPLANT
GOWN STRL REUS W/TWL LRG LVL3 (GOWN DISPOSABLE) ×12
GOWN STRL REUS W/TWL XL LVL3 (GOWN DISPOSABLE) ×6
GRADUATE 1200CC STRL 31836 (MISCELLANEOUS) ×2 IMPLANT
GRASPER SUT TROCAR 14GX15 (MISCELLANEOUS) ×3 IMPLANT
IRRIGATION STRYKERFLOW (MISCELLANEOUS) ×1 IMPLANT
IRRIGATOR STRYKERFLOW (MISCELLANEOUS) ×3
IV NS 1000ML (IV SOLUTION) ×3
IV NS 1000ML BAXH (IV SOLUTION) ×1 IMPLANT
KIT RM TURNOVER STRD PROC AR (KITS) ×3 IMPLANT
LABEL OR SOLS (LABEL) ×3 IMPLANT
LIQUID BAND (GAUZE/BANDAGES/DRESSINGS) ×3 IMPLANT
NDL SAFETY 22GX1.5 (NEEDLE) ×3 IMPLANT
NS IRRIG 500ML POUR BTL (IV SOLUTION) ×1 IMPLANT
PACK LAP CHOLECYSTECTOMY (MISCELLANEOUS) ×3 IMPLANT
RELOAD BLUE (STAPLE) ×3 IMPLANT
RELOAD STAPLE 60 2.6 WHT THN (STAPLE) ×3 IMPLANT
RELOAD STAPLE 60 3.8 GOLD REG (STAPLE) ×3 IMPLANT
RELOAD STAPLER GOLD 60MM (STAPLE) ×3 IMPLANT
RELOAD STAPLER WHITE 60MM (STAPLE) ×3 IMPLANT
SHEARS HARMONIC ACE PLUS 45CM (MISCELLANEOUS) ×3 IMPLANT
SLEEVE ENDOPATH XCEL 5M (ENDOMECHANICALS) ×9 IMPLANT
SLEEVE GASTRECTOMY 36FR VISIGI (MISCELLANEOUS) ×3 IMPLANT
STAPLER ECHELON LONG 60 440 (INSTRUMENTS) ×3 IMPLANT
STAPLER RELOAD GOLD 60MM (STAPLE) ×9
STAPLER RELOAD WHITE 60MM (STAPLE) ×9
SUT DEVICE BRAIDED 0X39 (SUTURE) ×2 IMPLANT
SUT DEVICE BRAIDED 2.0X39 (SUTURE) ×15 IMPLANT
SUT DVC VICRYL PGA 2.0X39 (SUTURE) ×8 IMPLANT
SUT ETHILON 3-0 FS-10 30 BLK (SUTURE) ×3
SUT MNCRL AB 4-0 PS2 18 (SUTURE) ×6 IMPLANT
SUT VIC AB 0 SH 27 (SUTURE) ×3 IMPLANT
SUTURE EHLN 3-0 FS-10 30 BLK (SUTURE) IMPLANT
SYR 20CC LL (SYRINGE) ×1 IMPLANT
TROCAR XCEL 12X100 BLDLESS (ENDOMECHANICALS) ×3 IMPLANT
TROCAR XCEL NON-BLD 5MMX100MML (ENDOMECHANICALS) ×3 IMPLANT
TUBING INSUFFLATOR HEATED (MISCELLANEOUS) ×3 IMPLANT
WATER STERILE IRR 1000ML POUR (IV SOLUTION) ×3 IMPLANT

## 2015-11-01 NOTE — Progress Notes (Signed)
Pt came to floor at approximately 1615. Pt very groggy. VSS. Pt woke up requesting pain and nausea medication. Nausea and pain medication were given to pt. Pt started snoring and was hard to arouse. Pt would barely open eyes to look at nurse. BP was taken and in normal limits. Respiratory paged to come set up pts CPAP machine that she normally wears at night time.

## 2015-11-01 NOTE — Progress Notes (Signed)
zofran given for nausea    Sleepy but arousable

## 2015-11-01 NOTE — Progress Notes (Signed)
Sleepy but arousable   States pain 7 out of 10  But sleeps when not stimulated   States nausea better

## 2015-11-01 NOTE — Brief Op Note (Signed)
11/01/2015  2:38 PM  PATIENT:  Erica LeeVeronica L Powers  54 y.o. female  PRE-OPERATIVE DIAGNOSIS:  MORBID OBESITY  POST-OPERATIVE DIAGNOSIS:  MORBID OBESITY with DIFFUSE INTRA-ABDOMINAL ADHESIONS, HIATAL HERNIA  PROCEDURE:  Procedure(s): LAPAROSCOPIC ROUX-EN-Y GASTRIC, HIATAL HERNIA REPAIR, EXTENSIVE LYSIS OF ADHESIONS (N/A)  SURGEON:  Surgeon(s) and Role:    * Everette RankMichael A Tyner, MD - Primary  PHYSICIAN ASSISTANT: Kallan Merrick, PA-C  ANESTHESIA:   general  EBL:  Total I/O In: 1300 [I.V.:1300] Out: 175 [Urine:175]  BLOOD ADMINISTERED:none  DRAINS: (19Fr) Blake drain(s) in the Left Flank   LOCAL MEDICATIONS USED:  MARCAINE     SPECIMEN:  No Specimen  DISPOSITION OF SPECIMEN:  N/A  COUNTS:  YES  TOURNIQUET:  * No tourniquets in log *  DICTATION: .Note written in EPIC  PLAN OF CARE: Admit to inpatient   PATIENT DISPOSITION:  PACU - hemodynamically stable.   Delay start of Pharmacological VTE agent (>24hrs) due to surgical blood loss or risk of bleeding: no

## 2015-11-01 NOTE — Anesthesia Preprocedure Evaluation (Signed)
Anesthesia Evaluation  Patient identified by MRN, date of birth, ID band Patient awake    Reviewed: Allergy & Precautions, H&P , NPO status , Patient's Chart, lab work & pertinent test results  History of Anesthesia Complications Negative for: history of anesthetic complications  Airway Mallampati: II  TM Distance: >3 FB Neck ROM: full    Dental  (+) Poor Dentition, Chipped   Pulmonary neg shortness of breath, asthma ,    Pulmonary exam normal breath sounds clear to auscultation       Cardiovascular Exercise Tolerance: Good hypertension, (-) angina(-) Past MI and (-) DOE Normal cardiovascular exam Rhythm:regular Rate:Normal     Neuro/Psych negative neurological ROS  negative psych ROS   GI/Hepatic Neg liver ROS, hiatal hernia, GERD  Controlled,  Endo/Other  diabetes, Type obesity  Renal/GU negative Renal ROS  negative genitourinary   Musculoskeletal  (+) Arthritis ,   Abdominal   Peds  Hematology negative hematology ROS (+)   Anesthesia Other Findings Past Medical History:   Asthma                                                         Comment:no issues over 1 yr   Hypertension                                                 GERD (gastroesophageal reflux disease)                       History of hiatal hernia                                    Past Surgical History:   HEEL SPUR SURGERY                               Bilateral              BREAST SURGERY                                   2000           Comment:Reduction   ROTATOR CUFF REPAIR                              2009         TONSILLECTOMY                                                 TUBAL LIGATION                                                COLONOSCOPY  N/A 04/08/2015       Comment:Procedure: COLONOSCOPY;  Surgeon: Midge Miniumarren Wohl,               MD;  [facility];  Service:   Gastroenterology;  Laterality: N/A;   ESOPHAGOGASTRODUODENOSCOPY (EGD) WITH PROPOFOL  N/A 07/07/2015       Comment:Procedure: ESOPHAGOGASTRODUODENOSCOPY (EGD)               WITH PROPOFOL;  Surgeon: Midge Miniumarren Wohl, MD;                [facility];  Service:               Endoscopy;  Laterality: N/A;  BMI    Body Mass Index   42.60 kg/m 2      Reproductive/Obstetrics negative OB ROS                             Anesthesia Physical Anesthesia Plan  ASA: III  Anesthesia Plan: General ETT   Post-op Pain Management:    Induction:   Airway Management Planned:   Additional Equipment:   Intra-op Plan:   Post-operative Plan:   Informed Consent: I have reviewed the patients History and Physical, chart, labs and discussed the procedure including the risks, benefits and alternatives for the proposed anesthesia with the patient or authorized representative who has indicated his/her understanding and acceptance.   Dental Advisory Given  Plan Discussed with: Anesthesiologist, CRNA and Surgeon  Anesthesia Plan Comments:         Anesthesia Quick Evaluation

## 2015-11-01 NOTE — H&P (Signed)
History and physical on paper chart. No changes noted. 

## 2015-11-01 NOTE — Progress Notes (Signed)
Applied knee high ted hose

## 2015-11-01 NOTE — Anesthesia Procedure Notes (Signed)
Procedure Name: Intubation Date/Time: 11/01/2015 11:15 AM Performed by: Henrietta HooverPOPE, Ulrick Methot Pre-anesthesia Checklist: Patient identified, Emergency Drugs available, Suction available, Patient being monitored and Timeout performed Patient Re-evaluated:Patient Re-evaluated prior to inductionOxygen Delivery Method: Circle system utilized Preoxygenation: Pre-oxygenation with 100% oxygen Intubation Type: IV induction Ventilation: Mask ventilation without difficulty Laryngoscope Size: Mac, 3 and Glidescope Grade View: Grade I Tube type: Oral Number of attempts: 3 Airway Equipment and Method: Bougie stylet and Stylet Placement Confirmation: ETT inserted through vocal cords under direct vision,  positive ETCO2 and breath sounds checked- equal and bilateral Secured at: 21 cm Tube secured with: Tape Dental Injury: Teeth and Oropharynx as per pre-operative assessment

## 2015-11-01 NOTE — Transfer of Care (Signed)
Immediate Anesthesia Transfer of Care Note  Patient: Erica Powers  Procedure(s) Performed: Procedure(s): LAPAROSCOPIC ROUX-EN-Y GASTRIC, HIATAL HERNIA REPAIR, EXTENSIVE LYSIS OF ADHESIONS (N/A)  Patient Location: PACU  Anesthesia Type:General  Level of Consciousness: awake, alert  and oriented  Airway & Oxygen Therapy: Patient Spontanous Breathing and Patient connected to face mask oxygen  Post-op Assessment: Report given to RN and Post -op Vital signs reviewed and stable  Post vital signs: stable  Last Vitals:  Filed Vitals:   11/01/15 0938 11/01/15 1448  BP: 138/99 160/97  Pulse: 71 102  Temp: 36.7 C 36.7 C  Resp: 18 24    Complications: No apparent anesthesia complications

## 2015-11-01 NOTE — Interval H&P Note (Signed)
History and Physical Interval Note:  11/01/2015 10:39 AM  Erica LeeVeronica L Powers  has presented today for surgery, with the diagnosis of MORBID OBESITY  The various methods of treatment have been discussed with the patient and family. After consideration of risks, benefits and other options for treatment, the patient has consented to  Procedure(s): LAPAROSCOPIC ROUX-EN-Y GASTRIC (N/A) as a surgical intervention .  The patient's history has been reviewed, patient examined, no change in status, stable for surgery.  I have reviewed the patient's chart and labs.  Questions were answered to the patient's satisfaction.     Everette Rankyner, Yacoub Diltz A

## 2015-11-01 NOTE — Op Note (Signed)
PATIENT: Erica Powers November 30, 1961  PROCEDURE PERFORMED: Procedure(s): LAPAROSCOPIC ROUX-EN-Y GASTRIC, HIATAL HERNIA REPAIR, EXTENSIVE LYSIS OF ADHESIONS (N/A) PRE-OP DIAGNOSIS: MORBID OBESITY,hiatal hernia, history of vertical banded gastroplasty with attendant adhesions POST-OP DIAGNOSIS: MORBID OBESITY with hiatal hernia, extensive adhesions in area of upper stomach and upper abdomen. ESTIMATED BLOOD LOSS: 200 mL SURGEON: Effie Shy A  ASSISTANT: Tilden Fossa   PROCEDURE IN DETAIL:The patient was brought to the operating room,  placed in a supine position, general anesthesia obtained with orotracheal intubation. Foley catheter inserted sterilely. TED hose and Thrombo-Gards applied. A foot board applied at the end of the operative bed. The chest and abdomen were sterilely prepped and draped. A 5 mm Optiview trocar  introduced in the left upper quadrant of the abdomen under direct visualization. Pneumoperitoneum obtained with carbon dioxide.Extensive omental adhesions across central abdomen encountered. Blunt dissection was used to expose an area free of adhesions in RUQ where a 5 mm trocar introduced under direct visualization. An additional trocar introduced in the RLQ through which omental adhesions freed by harmonic scalpel from the pelvic brim to the subxyphoid area. Four additional trocars were introduced across the upper abdomen. The omentum was bisected in the region of the mid transverse colon. The ligament of Treitz was identified and the bowel followed distally 50 cm at which point it was secured to the inferior margin of the stomach.Dense adhesions then freed from the entire undersurface of the left lobe of the liver. A Nathanson liver retractor then introduced through a subxiphoid defect and used to elevate the left lobe of the liver revealing a moderate indentation of the hiatus with thinning of the peritoneum consistent with a sliding hiatal hernia as noted preoperative upper GI  series. Given her long-standing history of reflux disease, it was decided to proceed with repair of this. The gastrohepatic ligament was incised followed by division of the peritoneum across the anterior hiatus. Blunt dissection was then used to mobilize the herniated peritoneum and both upper stomach and lower esophagus away from the overlying pericardium. The peritoneum was incised just lateral to the right crus, and blunt dissection was then used to reduce herniated lesser sac fatty tissue. The patient had division of phrenoesophageal ligament associated with the left crus and also attachments of the upper stomach to the undersurface of the left hemidiaphragm. Further circumferential dissection of the esophagus was then performed within the lower mediastinum, sweeping the esophagus and anterior and posterior vagal nerves away from the pericardium and pleural surfaces and also mobilizing away from the aorta. The circumferential dissection was extended into the lower mediastinum over a distance of approximately 7 cm, ultimately resulted in delivery of 2 cm of esophagus lying comfortably in the abdominal cavity. Posterior crural repair was then performed with 2 interrupted 0 Ethibond sutures. Adherent omentum across the upper stomach freed by harmonic scalpel. Scar bands across the upper stomach then freed to expose site of prior stomach stapling. The area of maximal scarring associated with prior band was identified.  The patient then had division of the vascular pedicles immediately above this adjacent to the lesser curvature of the stomach beginning approximately 6 cm inferior to the GE junction. This was extended superiorly over a distance of approximately 1 cm revealing the lesser sac. A series of gold load GIA staplers were then used to create a proximal gastric pouch, first firing placed in a transverse direction followed by a vertical line of staples brought out just lateral to the angle of His. The staples  placed along line of prior pouch. This was done with a 7236 JamaicaFrench ViSiGi device deployed in the upper stomach to be used as an aid in sizing of the gastric pouch. Next, the mobilized jejunum was brought up and secured to the posterior aspect of the gastric pouch.  An enterotomy was then made in the distal posterior aspect of the gastric pouch and opposing portion of the mobilized jejunum. A blue load stapler was used to create a common lumen fired at the 2.5 cm mark. The resulting enterotomy closed with a running 2-0 Polysorb suture. The suture and staple line then reinforced with an additional running Polysorb suture. The latter  performed with a 3436 JamaicaFrench ViSiGi directed through the area of the anastomosis and used as an aid in sizing of the anastomotic lumen. The bowel was divided immediately proximal to this, creating in effect a biliary limb.  There was a partial division of the mesentery. A Roux limb was marked at 125 cm at which point a side-to-side biliary limb to common channel limb anastomosis was created. This was accomplished with enterotomies on the antimesenteric border of these 2 portions of bowel and a white load stapler  used to create a common lumen with a device fired at 3 cm mark. The resulting enterotomy closed with a repeat firing of white load GI stapler. Anti torsion sutures placed distally, the mesenteric window closed with a running 2-0 Surgidac suture. Petersen defect closed in a similar fashion. The bowel was occluded distal to the gastrojejunal anastomosis and insufflation  performed, and a saline bath  performed. No air leak identified in this anastomotic region. The divided limbs of the omentum were secured over the area of the gastrojejunal anastomosis. A 18 Fr Blake drain placed under the LL of the liver.The pneumoperitoneum  relieved, the trocars removed, the wounds  injected with 0.25% Marcaine and closed with 4-0 Monocryl in the dermis followed by Dermabond. Drain sealed by nylon  suture and covered by adherent dressing.Patient  allowed to recover at this point having tolerated the procedure well.

## 2015-11-01 NOTE — Anesthesia Postprocedure Evaluation (Signed)
Anesthesia Post Note  Patient: Erica Powers  Procedure(s) Performed: Procedure(s) (LRB): LAPAROSCOPIC ROUX-EN-Y GASTRIC, HIATAL HERNIA REPAIR, EXTENSIVE LYSIS OF ADHESIONS (N/A)  Patient location during evaluation: PACU Anesthesia Type: General Level of consciousness: awake and alert Pain management: pain level controlled Vital Signs Assessment: post-procedure vital signs reviewed and stable Respiratory status: spontaneous breathing, nonlabored ventilation, respiratory function stable and patient connected to nasal cannula oxygen Cardiovascular status: blood pressure returned to baseline and stable Postop Assessment: no signs of nausea or vomiting Anesthetic complications: no    Last Vitals:  Filed Vitals:   11/01/15 1747 11/01/15 2056  BP: 146/76 122/65  Pulse: 96 88  Temp:  37.2 C  Resp:      Last Pain:  Filed Vitals:   11/01/15 2056  PainSc: 7                  Jomarie LongsJoseph K Nohelani Benning

## 2015-11-01 NOTE — Progress Notes (Signed)
Pt. Requested only one iv while she was awake.

## 2015-11-01 NOTE — Progress Notes (Signed)
INTERVENTION:  RD consulted for nutrition education regarding inpatient bariatric surgery.   RD provided "The Liquid Diet" handout from the Bariatric Surgery Guide from the [facility]. Family at bedside. This handout previously provided to patient prior to surgery is a duplicate copy. Discussed what foods/liquids are consistent with a Clear Liquid Diet and reinforced Key Concepts such as no carbonation, no caffeine, or sugar containing beverages. Provided methods to prevent dehydration and promote protein intake, using clock and sample fluid schedule. RD encouraged follow-up with outpatient dietitian after discharge.  Teach back method used.  Expect good compliance.  NUTRITION DIAGNOSIS:  Food and nutrition knowledge related deficit related to recent bariatric surgery as evidenced by dietitian consult for nutrition education   GOAL:  Patient will be able to sip and tolerate CL within 24-48 hours  MONITOR:  Energy intake Digestive system  ASSESSMENT:  Pt s/p lap roux-en-y gastric, hiatal hernia repair, extensive lysis of ashesions. Pt groggy on visit but arousable; complains of nausea, RN aware. Family at bedside   Body mass index is 42.6 kg/(m^2).  Current diet order is Bariatric Clear with unjury supplement TID, pt has not received meal tray or any liquids yet.   Labs and medications reviewed.   LOW Care Level  Romelle Starcherate Gaje Tennyson MS, [place]RD, LDN [phone] Pager

## 2015-11-01 NOTE — Progress Notes (Signed)
Pt's foley was removed per MD order.

## 2015-11-02 ENCOUNTER — Encounter: Payer: Self-pay | Admitting: Bariatrics

## 2015-11-02 LAB — COMPREHENSIVE METABOLIC PANEL
ALT: 766 U/L — ABNORMAL HIGH (ref 14–54)
AST: 882 U/L — AB (ref 15–41)
Albumin: 3.6 g/dL (ref 3.5–5.0)
Alkaline Phosphatase: 71 U/L (ref 38–126)
Anion gap: 5 (ref 5–15)
BILIRUBIN TOTAL: 0.4 mg/dL (ref 0.3–1.2)
BUN: 15 mg/dL (ref 6–20)
CALCIUM: 9.1 mg/dL (ref 8.9–10.3)
CO2: 28 mmol/L (ref 22–32)
CREATININE: 0.81 mg/dL (ref 0.44–1.00)
Chloride: 104 mmol/L (ref 101–111)
GFR calc Af Amer: >60 mL/min (ref >60)
Glucose, Bld: 184 mg/dL — ABNORMAL HIGH (ref 65–99)
POTASSIUM: 4.7 mmol/L (ref 3.5–5.1)
Sodium: 137 mmol/L (ref 135–145)
TOTAL PROTEIN: 7.1 g/dL (ref 6.5–8.1)

## 2015-11-02 LAB — CBC WITH DIFFERENTIAL/PLATELET
BASOS ABS: 0 10*3/uL (ref 0–0.1)
BASOS PCT: 0 %
EOS ABS: 0 10*3/uL (ref 0–0.7)
Eosinophils Relative: 0 %
HEMATOCRIT: 37.4 % (ref 35.0–47.0)
Hemoglobin: 11.9 g/dL — ABNORMAL LOW (ref 12.0–16.0)
Lymphocytes Relative: 5 %
Lymphs Abs: 0.8 10*3/uL — ABNORMAL LOW (ref 1.0–3.6)
MCH: 28.2 pg (ref 26.0–34.0)
MCHC: 31.8 g/dL — ABNORMAL LOW (ref 32.0–36.0)
MCV: 88.6 fL (ref 80.0–100.0)
MONOS PCT: 6 %
Monocytes Absolute: 0.8 10*3/uL (ref 0.2–0.9)
NEUTROS PCT: 89 %
Neutro Abs: 13 10*3/uL — ABNORMAL HIGH (ref 1.4–6.5)
Platelets: 222 10*3/uL (ref 150–440)
RBC: 4.21 MIL/uL (ref 3.80–5.20)
RDW: 15.6 % — AB (ref 11.5–14.5)
WBC: 14.6 10*3/uL — ABNORMAL HIGH (ref 3.6–11.0)

## 2015-11-02 MED ORDER — ENOXAPARIN SODIUM 30 MG/0.3ML ~~LOC~~ SOLN: 30.0000 mg | SUBCUTANEOUS | Status: DC

## 2015-11-02 MED ORDER — DIPHENHYDRAMINE HCL 50 MG/ML IJ SOLN
25.0000 mg | INTRAMUSCULAR | Status: DC | PRN
  Administered 2015-11-02: 25 mg via INTRAVENOUS

## 2015-11-02 MED ORDER — DIPHENHYDRAMINE HCL 25 MG PO CAPS
50.0000 mg | ORAL_CAPSULE | ORAL | Status: DC | PRN
  Administered 2015-11-02 – 2015-11-04 (×3): 50 mg via ORAL
  Filled 2015-11-02 (×3): qty 2

## 2015-11-02 NOTE — Progress Notes (Signed)
Doing better with ambulation and po intake. Some itching with narcotics but no rash. Will plan home in am.

## 2015-11-02 NOTE — Progress Notes (Signed)
Subjective: Interval History: has complaints controlled incisional pain.  Objective: Vital signs in last 24 hours: Temp:  [97.9 F (36.6 C)-99 F (37.2 C)] 98.1 F (36.7 C) (11/30 0448) Pulse Rate:  [71-102] 80 (11/30 0448) Resp:  [15-37] 17 (11/29 1645) BP: (109-162)/(46-99) 153/76 mmHg (11/30 0448) SpO2:  [97 %-100 %] 97 % (11/30 0448) Weight:  [116.121 kg (256 lb)-121.564 kg (268 lb)] 121.564 kg (268 lb) (11/29 1736)  Intake/Output from previous day: 11/29 0701 - 11/30 0700 In: 2999 [P.O.:45; I.V.:2954] Out: 1140 [Urine:1025; Drains:115] Intake/Output this shift:    BP 153/76 mmHg  Pulse 80  Temp(Src) 98.1 F (36.7 C) (Oral)  Resp 17  Ht  (1.651 m)  Wt 121.564 kg (268 lb)  BMI 44.60 kg/m2  SpO2 97% Lungs: clear to auscultation bilaterally Abdomen: soft, non-tender; bowel sounds normal; no masses,  no organomegaly  Results for orders placed or performed during the hospital encounter of 11/01/15 (from the past 24 hour(s))  I-STAT 4, (NA,K, GLUC, HGB,HCT)     Status: None   Collection Time: 11/01/15 10:01 AM  Result Value Ref Range   Sodium 138 135 - 145 mmol/L   Potassium 4.1 3.5 - 5.1 mmol/L   Glucose, Bld 91 65 - 99 mg/dL   HCT 16.1 09.6 - 04.5 %   Hemoglobin 14.3 12.0 - 15.0 g/dL  Hemoglobin and hematocrit, blood     Status: Abnormal   Collection Time: 11/01/15  4:49 PM  Result Value Ref Range   Hemoglobin 11.9 (L) 12.0 - 16.0 g/dL   HCT 40.9 81.1 - 91.4 %  CBC WITH DIFFERENTIAL     Status: Abnormal   Collection Time: 11/02/15  4:47 AM  Result Value Ref Range   WBC 14.6 (H) 3.6 - 11.0 K/uL   RBC 4.21 3.80 - 5.20 MIL/uL   Hemoglobin 11.9 (L) 12.0 - 16.0 g/dL   HCT 78.2 95.6 - 21.3 %   MCV 88.6 80.0 - 100.0 fL   MCH 28.2 26.0 - 34.0 pg   MCHC 31.8 (L) 32.0 - 36.0 g/dL   RDW 08.6 (H) 57.8 - 46.9 %   Platelets 222 150 - 440 K/uL   Neutrophils Relative % 89 %   Neutro Abs 13.0 (H) 1.4 - 6.5 K/uL   Lymphocytes Relative 5 %   Lymphs Abs 0.8 (L) 1.0 -  3.6 K/uL   Monocytes Relative 6 %   Monocytes Absolute 0.8 0.2 - 0.9 K/uL   Eosinophils Relative 0 %   Eosinophils Absolute 0.0 0 - 0.7 K/uL   Basophils Relative 0 %   Basophils Absolute 0.0 0 - 0.1 K/uL  Comprehensive metabolic panel     Status: Abnormal   Collection Time: 11/02/15  4:47 AM  Result Value Ref Range   Sodium 137 135 - 145 mmol/L   Potassium 4.7 3.5 - 5.1 mmol/L   Chloride 104 101 - 111 mmol/L   CO2 28 22 - 32 mmol/L   Glucose, Bld 184 (H) 65 - 99 mg/dL   BUN 15 6 - 20 mg/dL   Creatinine, Ser 6.29 0.44 - 1.00 mg/dL   Calcium 9.1 8.9 - 52.8 mg/dL   Total Protein 7.1 6.5 - 8.1 g/dL   Albumin 3.6 3.5 - 5.0 g/dL   AST 413 (H) 15 - 41 U/L   ALT 766 (H) 14 - 54 U/L   Alkaline Phosphatase 71 38 - 126 U/L   Total Bilirubin 0.4 0.3 - 1.2 mg/dL   GFR calc non Af  Amer >60 >60 mL/min   GFR calc Af Amer >60 >60 mL/min   Anion gap 5 5 - 15    Studies/Results: No results found.  Scheduled Meds: . antiseptic oral rinse  7 mL Mouth Rinse BID  . enoxaparin (LOVENOX) injection  30 mg Subcutaneous Q12H  . Influenza vac split quadrivalent PF  0.5 mL Intramuscular Tomorrow-1000  . pantoprazole (PROTONIX) IV  40 mg Intravenous QHS  . protein supplement  2 oz Oral QID   Continuous Infusions: . dextrose 5 % and 0.45 % NaCl with KCl 20 mEq/L 125 mL/hr at 11/02/15 0144   PRN Meds:acetaminophen (TYLENOL) oral liquid 160 mg/5 mL, diphenhydrAMINE, enalaprilat, HYDROmorphone (DILAUDID) injection, ondansetron (ZOFRAN) IV, oxyCODONE  Assessment/Plan: Doing well post operatively. Will encourage ambulation and use of incentive spirometry. Home later today or in am.   LOS: 1 day   Everette Rankyner, Shandon Matson A

## 2015-11-02 NOTE — Progress Notes (Signed)
Notified Dr Alva Garnetyner of pt itching;  Dr ordered to change frequency from q6 to q4 hrs prn

## 2015-11-03 ENCOUNTER — Encounter
Admission: RE | Disposition: A | Discharge status: Home / Self Care | Payer: Self-pay | Source: Ambulatory Visit | Attending: Bariatrics

## 2015-11-03 ENCOUNTER — Inpatient Hospital Stay: Payer: BLUE CROSS/BLUE SHIELD

## 2015-11-03 ENCOUNTER — Encounter: Payer: Self-pay | Admitting: Radiology

## 2015-11-03 ENCOUNTER — Encounter: Payer: Self-pay | Admitting: Anesthesiology

## 2015-11-03 DIAGNOSIS — R768 Other specified abnormal immunological findings in serum: Secondary | ICD-10-CM | POA: Insufficient documentation

## 2015-11-03 LAB — CBC WITH DIFFERENTIAL/PLATELET
BASOS ABS: 0 10*3/uL (ref 0–0.1)
BASOS ABS: 0.1 10*3/uL (ref 0–0.1)
BASOS PCT: 1 %
Basophils Absolute: 0 10*3/uL (ref 0–0.1)
Basophils Relative: 0 %
Basophils Relative: 0 %
EOS ABS: 0 10*3/uL (ref 0–0.7)
EOS PCT: 0 %
EOS PCT: 0 %
Eosinophils Absolute: 0 10*3/uL (ref 0–0.7)
Eosinophils Absolute: 0 10*3/uL (ref 0–0.7)
Eosinophils Relative: 0 %
HCT: 34.3 % — ABNORMAL LOW (ref 35.0–47.0)
HCT: 34.5 % — ABNORMAL LOW (ref 35.0–47.0)
HEMATOCRIT: 34 % — AB (ref 35.0–47.0)
HEMOGLOBIN: 10.6 g/dL — AB (ref 12.0–16.0)
Hemoglobin: 11.1 g/dL — ABNORMAL LOW (ref 12.0–16.0)
Hemoglobin: 10.9 g/dL — ABNORMAL LOW (ref 12.0–16.0)
LYMPHS ABS: 1.2 10*3/uL (ref 1.0–3.6)
LYMPHS PCT: 8 %
LYMPHS PCT: 10 %
Lymphocytes Relative: 10 %
Lymphs Abs: 1.1 10*3/uL (ref 1.0–3.6)
Lymphs Abs: 1.3 10*3/uL (ref 1.0–3.6)
MCH: 29 pg (ref 26.0–34.0)
MCH: 28.1 pg (ref 26.0–34.0)
MCH: 28 pg (ref 26.0–34.0)
MCHC: 32.4 g/dL (ref 32.0–36.0)
MCHC: 31.6 g/dL — ABNORMAL LOW (ref 32.0–36.0)
MCHC: 31.2 g/dL — AB (ref 32.0–36.0)
MCV: 89.6 fL (ref 80.0–100.0)
MCV: 88.8 fL (ref 80.0–100.0)
MCV: 89.7 fL (ref 80.0–100.0)
MONO ABS: 1.1 10*3/uL — AB (ref 0.2–0.9)
MONO ABS: 1 10*3/uL — AB (ref 0.2–0.9)
MONOS PCT: 9 %
MONOS PCT: 8 %
Monocytes Absolute: 1.1 10*3/uL — ABNORMAL HIGH (ref 0.2–0.9)
Monocytes Relative: 8 %
NEUTROS ABS: 11.1 10*3/uL — AB (ref 1.4–6.5)
NEUTROS ABS: 10.9 10*3/uL — AB (ref 1.4–6.5)
Neutro Abs: 10.2 10*3/uL — ABNORMAL HIGH (ref 1.4–6.5)
Neutrophils Relative %: 81 %
Neutrophils Relative %: 83 %
Neutrophils Relative %: 82 %
PLATELETS: 174 10*3/uL (ref 150–440)
PLATELETS: 175 10*3/uL (ref 150–440)
Platelets: 164 10*3/uL (ref 150–440)
RBC: 3.83 MIL/uL (ref 3.80–5.20)
RBC: 3.89 MIL/uL (ref 3.80–5.20)
RBC: 3.79 MIL/uL — ABNORMAL LOW (ref 3.80–5.20)
RDW: 15.9 % — AB (ref 11.5–14.5)
RDW: 15.4 % — AB (ref 11.5–14.5)
RDW: 15.6 % — AB (ref 11.5–14.5)
WBC: 12.6 10*3/uL — ABNORMAL HIGH (ref 3.6–11.0)
WBC: 13.3 10*3/uL — AB (ref 3.6–11.0)
WBC: 13.2 10*3/uL — ABNORMAL HIGH (ref 3.6–11.0)

## 2015-11-03 SURGERY — LAPAROSCOPY, DIAGNOSTIC
Anesthesia: Choice

## 2015-11-03 MED ORDER — IOHEXOL 300 MG/ML  SOLN
100.0000 mL | Freq: Once | INTRAMUSCULAR | Status: AC | PRN
  Administered 2015-11-03: 100 mL via INTRAVENOUS

## 2015-11-03 MED ORDER — SODIUM CHLORIDE 0.9 % IV BOLUS (SEPSIS)
1000.0000 mL | Freq: Once | INTRAVENOUS | Status: AC
  Administered 2015-11-03: 1000 mL via INTRAVENOUS

## 2015-11-03 MED ORDER — PIPERACILLIN-TAZOBACTAM 3.375 G IVPB 30 MIN
3.3750 g | Freq: Once | INTRAVENOUS | Status: AC
  Administered 2015-11-03: 3.375 g via INTRAVENOUS
  Filled 2015-11-03: qty 50

## 2015-11-03 SURGICAL SUPPLY — 45 items
APPLIER CLIP ROT 10 11.4 M/L (STAPLE)
APR CLP MED LRG 11.4X10 (STAPLE)
BANDAGE ELASTIC 6 LF NS (GAUZE/BANDAGES/DRESSINGS) ×2 IMPLANT
BLADE SURG SZ11 CARB STEEL (BLADE) ×1 IMPLANT
BNDG CMPR MED 5X6 ELC HKLP NS (GAUZE/BANDAGES/DRESSINGS)
CANISTER SUCT 1200ML W/VALVE (MISCELLANEOUS) ×1 IMPLANT
CHLORAPREP W/TINT 26ML (MISCELLANEOUS) ×2 IMPLANT
CLIP APPLIE ROT 10 11.4 M/L (STAPLE) IMPLANT
DEFOGGER SCOPE WARMER CLEARIFY (MISCELLANEOUS) ×1 IMPLANT
DRAPE UTILITY 15X26 TOWEL STRL (DRAPES) ×2 IMPLANT
ENDOLOOP SUT PDS II  0 18 (SUTURE)
ENDOLOOP SUT PDS II 0 18 (SUTURE) IMPLANT
FILTER LAP SMOKE EVAC STRL (MISCELLANEOUS) ×1 IMPLANT
GLOVE BIO SURGEON STRL SZ7 (GLOVE) ×2 IMPLANT
GLOVE BIOGEL PI IND STRL 8.5 (GLOVE) ×1 IMPLANT
GLOVE BIOGEL PI INDICATOR 8.5 (GLOVE)
GLOVE SURG SYN 8.0 (GLOVE) IMPLANT
GLOVE SURG SYN 8.0 PF PI (GLOVE) ×1 IMPLANT
GOWN STRL REUS W/ TWL LRG LVL3 (GOWN DISPOSABLE) ×5 IMPLANT
GOWN STRL REUS W/TWL LRG LVL3 (GOWN DISPOSABLE)
GRASPER SUT TROCAR 14GX15 (MISCELLANEOUS) ×1 IMPLANT
IRRIGATION STRYKERFLOW (MISCELLANEOUS) ×1 IMPLANT
IRRIGATOR STRYKERFLOW (MISCELLANEOUS)
IV NS 1000ML (IV SOLUTION)
IV NS 1000ML BAXH (IV SOLUTION) ×1 IMPLANT
KIT RM TURNOVER STRD PROC AR (KITS) ×1 IMPLANT
LABEL OR SOLS (LABEL) ×1 IMPLANT
LIQUID BAND (GAUZE/BANDAGES/DRESSINGS) ×2 IMPLANT
NDL SAFETY 22GX1.5 (NEEDLE) ×1 IMPLANT
NS IRRIG 500ML POUR BTL (IV SOLUTION) ×1 IMPLANT
PACK LAP CHOLECYSTECTOMY (MISCELLANEOUS) ×1 IMPLANT
RELOAD BLUE (STAPLE) ×3 IMPLANT
RELOAD GOLD (STAPLE) ×4 IMPLANT
RELOAD GREEN (STAPLE) ×1 IMPLANT
SHEARS HARMONIC ACE PLUS 45CM (MISCELLANEOUS) ×1 IMPLANT
SLEEVE ENDOPATH XCEL 5M (ENDOMECHANICALS) ×3 IMPLANT
SLEEVE GASTRECTOMY 36FR VISIGI (MISCELLANEOUS) ×1 IMPLANT
STAPLER ECHELON LONG 60 440 (INSTRUMENTS) ×1 IMPLANT
SUT DEVICE BRAIDED 0X39 (SUTURE) ×1 IMPLANT
SUT MNCRL AB 4-0 PS2 18 (SUTURE) ×1 IMPLANT
SUT VIC AB 0 CT2 27 (SUTURE) ×1 IMPLANT
SYR 20CC LL (SYRINGE) ×1 IMPLANT
TROCAR BLADELESS 15MM (ENDOMECHANICALS) ×1 IMPLANT
TROCAR XCEL NON-BLD 5MMX100MML (ENDOMECHANICALS) ×1 IMPLANT
TUBING INSUFFLATOR HEATED (MISCELLANEOUS) ×1 IMPLANT

## 2015-11-03 NOTE — Progress Notes (Signed)
Ct scan negative for leak or ischemia. + atelectasis bilateral. ? To early for infiltrate. Will empirically add antibiotics and ask hospitalist to monitor.

## 2015-11-03 NOTE — Progress Notes (Signed)
Subjective: Interval History: has complaints modest incisional pain and occas cough.  Objective: Vital signs in last 24 hours: Temp:  [98.1 F (36.7 C)-100.8 F (38.2 C)] 99.8 F (37.7 C) (12/01 1214) Pulse Rate:  [90-107] 107 (12/01 1214) Resp:  [17-28] 17 (12/01 1214) BP: (96-122)/(54-83) 96/54 mmHg (12/01 1214) SpO2:  [83 %-94 %] 83 % (12/01 1214)  Intake/Output from previous day: 11/30 0701 - 12/01 0700 In: 2895 [I.V.:2815] Out: 330 [Urine:300; Drains:30] Intake/Output this shift: Total I/O In: 52.3 [I.V.:52.3] Out: 500 [Urine:500]  BP 96/54 mmHg  Pulse 107  Temp(Src) 99.8 F (37.7 C) (Oral)  Resp 17  Ht  (1.651 m)  Wt 121.564 kg (268 lb)  BMI 44.60 kg/m2  SpO2 83% Lungs: diminished breath sounds base - bilaterally, no sputum Abdomen: non distended, tender primarily around drain sites. jp drain with serous fluid,  Results for orders placed or performed during the hospital encounter of 11/01/15 (from the past 24 hour(s))  CBC with Differential     Status: Abnormal   Collection Time: 11/03/15  4:04 AM  Result Value Ref Range   WBC 12.6 (H) 3.6 - 11.0 K/uL   RBC 3.83 3.80 - 5.20 MIL/uL   Hemoglobin 11.1 (L) 12.0 - 16.0 g/dL   HCT 40.9 (L) 81.1 - 91.4 %   MCV 89.6 80.0 - 100.0 fL   MCH 29.0 26.0 - 34.0 pg   MCHC 32.4 32.0 - 36.0 g/dL   RDW 78.2 (H) 95.6 - 21.3 %   Platelets 174 150 - 440 K/uL   Neutrophils Relative % 81 %   Neutro Abs 10.2 (H) 1.4 - 6.5 K/uL   Lymphocytes Relative 10 %   Lymphs Abs 1.2 1.0 - 3.6 K/uL   Monocytes Relative 9 %   Monocytes Absolute 1.1 (H) 0.2 - 0.9 K/uL   Eosinophils Relative 0 %   Eosinophils Absolute 0.0 0 - 0.7 K/uL   Basophils Relative 0 %   Basophils Absolute 0.0 0 - 0.1 K/uL  CBC with Differential/Platelet     Status: Abnormal   Collection Time: 11/03/15  9:38 AM  Result Value Ref Range   WBC 13.3 (H) 3.6 - 11.0 K/uL   RBC 3.89 3.80 - 5.20 MIL/uL   Hemoglobin 10.9 (L) 12.0 - 16.0 g/dL   HCT 08.6 (L) 57.8 - 46.9  %   MCV 88.8 80.0 - 100.0 fL   MCH 28.1 26.0 - 34.0 pg   MCHC 31.6 (L) 32.0 - 36.0 g/dL   RDW 62.9 (H) 52.8 - 41.3 %   Platelets 175 150 - 440 K/uL   Neutrophils Relative % 83 %   Neutro Abs 11.1 (H) 1.4 - 6.5 K/uL   Lymphocytes Relative 8 %   Lymphs Abs 1.1 1.0 - 3.6 K/uL   Monocytes Relative 8 %   Monocytes Absolute 1.1 (H) 0.2 - 0.9 K/uL   Eosinophils Relative 0 %   Eosinophils Absolute 0.0 0 - 0.7 K/uL   Basophils Relative 1 %   Basophils Absolute 0.1 0 - 0.1 K/uL    Studies/Results: Dg Chest Port 1 View  11/03/2015  CLINICAL DATA:  Cough, asthma. EXAM: PORTABLE CHEST 1 VIEW COMPARISON:  06/27/2015 P FINDINGS: Examination limited by body habitus. Extremely low lung volumes with vascular crowding and atelectasis. Marked eventration of the right hemidiaphragm. Heart size is accentuated but grossly normal. No obvious pleural effusions. IMPRESSION: Very low lung volumes with vascular crowding and bibasilar atelectasis. Electronically Signed   By: Orlene Plum.D.  On: 11/03/2015 14:27   Dg Kayleen MemosUgi  W/kub  11/03/2015  CLINICAL DATA:  Gastric bypass 2 days ago. EXAM: WATER SOLUBLE UPPER GI SERIES TECHNIQUE: Single-column upper GI series was performed using water soluble contrast. CONTRAST:  Gastrografin. COMPARISON:  None. FLUOROSCOPY TIME:  Radiation Exposure Index (as provided by the fluoroscopic device): 54.9 mGy FINDINGS: This was a limited exam as patient could not tolerate the Gastrografin. Esophagus gastric remnant and proximal small bowel appear normal. No definite leakage identified, however again this is a limited exam as the patient could only drink a small amount of Gastrografin. IMPRESSION: Limited exam as the patient could not tolerate the Gastrografin well. No definite evidence of obstruction or leakage following gastric surgery . Electronically Signed   By: Maisie Fushomas  Register   On: 11/03/2015 12:29    Scheduled Meds: . antiseptic oral rinse  7 mL Mouth Rinse BID  . enoxaparin  (LOVENOX) injection  30 mg Subcutaneous Q12H  . pantoprazole (PROTONIX) IV  40 mg Intravenous QHS  . protein supplement  2 oz Oral QID   Continuous Infusions: . dextrose 5 % and 0.45 % NaCl with KCl 20 mEq/L 125 mL/hr at 11/03/15 0348   PRN Meds:acetaminophen (TYLENOL) oral liquid 160 mg/5 mL, diphenhydrAMINE, enalaprilat, HYDROmorphone (DILAUDID) injection, ondansetron (ZOFRAN) IV, oxyCODONE  Assessment/Plan:low grade elevation of temperature, low grade tachycardia and mild hypoxemia. Was tolerating po intake and several efforts at ambulation earlier, jp unremarkable and contrast study with rapid clearing of contrast through pouch and proximal Roux limb. No obvious free air. No leak. Portable CXR with hypoventilation, on UGI there is ? of  bilat atelectasis  Note leukocytosis but not convinced of intraabdominal event. ? Pulmonary source. Will proceed with ct scan of chest and abdomen and delay idea of laparoscopy.   LOS: 2 days   Everette Rankyner, Michael A

## 2015-11-04 ENCOUNTER — Inpatient Hospital Stay: Payer: BLUE CROSS/BLUE SHIELD

## 2015-11-04 LAB — CBC WITH DIFFERENTIAL/PLATELET
BASOS ABS: 0 10*3/uL (ref 0–0.1)
BASOS PCT: 0 %
EOS ABS: 0 10*3/uL (ref 0–0.7)
EOS PCT: 0 %
HEMATOCRIT: 32.4 % — AB (ref 35.0–47.0)
HEMOGLOBIN: 10.1 g/dL — AB (ref 12.0–16.0)
Lymphocytes Relative: 11 %
Lymphs Abs: 1.2 10*3/uL (ref 1.0–3.6)
MCH: 28 pg (ref 26.0–34.0)
MCHC: 31.2 g/dL — AB (ref 32.0–36.0)
MCV: 89.6 fL (ref 80.0–100.0)
MONO ABS: 1 10*3/uL — AB (ref 0.2–0.9)
MONOS PCT: 8 %
NEUTROS ABS: 9.2 10*3/uL — AB (ref 1.4–6.5)
Neutrophils Relative %: 81 %
Platelets: 165 10*3/uL (ref 150–440)
RBC: 3.61 MIL/uL — ABNORMAL LOW (ref 3.80–5.20)
RDW: 15.5 % — ABNORMAL HIGH (ref 11.5–14.5)
WBC: 11.4 10*3/uL — ABNORMAL HIGH (ref 3.6–11.0)

## 2015-11-04 MED ORDER — ENOXAPARIN SODIUM 30 MG/0.3ML ~~LOC~~ SOLN: 30.0000 mg | SUBCUTANEOUS | Status: DC

## 2015-11-04 MED ORDER — ONDANSETRON 4 MG PO TBDP: 4.0000 mg | ORAL_TABLET | Freq: Three times a day (TID) | ORAL | Status: DC | PRN

## 2015-11-04 MED ORDER — HYDROCODONE-ACETAMINOPHEN 7.5-325 MG/15ML PO SOLN
15.0000 mL | Freq: Four times a day (QID) | ORAL | Status: DC | PRN
Start: 2015-11-04 — End: 2016-08-02

## 2015-11-04 MED ORDER — ENOXAPARIN SODIUM 30 MG/0.3ML ~~LOC~~ SOLN: 30.0000 mg | Freq: Two times a day (BID) | SUBCUTANEOUS | Status: DC

## 2015-11-04 NOTE — Discharge Summary (Signed)
Physician Discharge Summary  Patient ID: Erica Powers MRN: [id number] DOB/AGE: 54/04/1961 54 y.o.  Admit date: 11/01/2015 Discharge date: 11/04/2015  Admission Diagnoses:morbid obesity  Discharge Diagnoses:  Active Problems:   Bariatric surgery status    Procedure(s): LAPAROSCOPIC ROUX-EN-Y GASTRIC, HIATAL HERNIA REPAIR, EXTENSIVE LYSIS OF ADHESIONS (N/A)  Discharged Condition: good  Hospital Course: Tolerated operative procedure well with conversion of vertical banded gastroplasty to gastric bypass. Her initial course remarkable for moderate pain and poor volume with use of IS. On the second POD noted to develop a low grade temp elevation and mild leukocytosis with hypoxia without O2 supplement. An UGI showed no leak and a f/u CT scan confirmed the same and the presence of atelectasis. She improved her use of IS with subsequent resolution of hypoxygenation and temperature issues. No treatment for pneumonia felt to be necessary after evaluation by the hospitalist service. She has minimal pain and is taking po well at time of discharge.  Consults: hospitalist  Significant Diagnostic Studies: radiology: CT scan: no evidence of leak, + atelectasis  Treatments: IV hydration  Discharge Exam: Blood pressure 125/79, pulse 88, temperature 97.6 F (36.4 C), temperature source Oral, resp. rate 17, height 5\' 5"  (1.651 m), weight 121.564 kg (268 lb), SpO2 99 %. General appearance: alert and cooperative Resp: clear to auscultation bilaterally GI: soft, non-tender; bowel sounds normal; no masses,  no organomegaly  Disposition: 01-Home or Self Care  Discharge Instructions    Ambulate hourly while awake    Complete by:  As directed      Ambulate hourly while awake    Complete by:  As directed      Call MD for:  difficulty breathing, headache or visual disturbances    Complete by:  As directed      Call MD for:  difficulty breathing, headache or visual disturbances    Complete by:  As  directed      Call MD for:  persistant dizziness or light-headedness    Complete by:  As directed      Call MD for:  persistant dizziness or light-headedness    Complete by:  As directed      Call MD for:  persistant nausea and vomiting    Complete by:  As directed      Call MD for:  persistant nausea and vomiting    Complete by:  As directed      Call MD for:  redness, tenderness, or signs of infection (pain, swelling, redness, odor or green/yellow discharge around incision site)    Complete by:  As directed      Call MD for:  redness, tenderness, or signs of infection (pain, swelling, redness, odor or green/yellow discharge around incision site)    Complete by:  As directed      Call MD for:  severe uncontrolled pain    Complete by:  As directed      Call MD for:  severe uncontrolled pain    Complete by:  As directed      Call MD for:  temperature >101 F    Complete by:  As directed      Call MD for:  temperature >101 F    Complete by:  As directed      Diet bariatric full liquid    Complete by:  As directed      Diet bariatric full liquid    Complete by:  As directed      Discharge instructions  Complete by:  As directed   D/c to home in am if takes po well F/u in my office as scheduled     Discharge instructions    Complete by:  As directed   F/u in my office as scheduled.     Incentive spirometry    Complete by:  As directed   Perform hourly while awake     Incentive spirometry    Complete by:  As directed   Perform hourly while awake  Continue use through weekend and prn thereafter            Medication List    STOP taking these medications        hydrochlorothiazide 50 MG tablet  Commonly known as:  HYDRODIURIL      TAKE these medications        enoxaparin 30 MG/0.3ML injection  Commonly known as:  LOVENOX  Inject 0.3 mLs (30 mg total) into the skin daily.     enoxaparin 30 MG/0.3ML injection  Commonly known as:  LOVENOX  Inject 0.3 mLs (30 mg  total) into the skin daily.     HYDROcodone-acetaminophen 7.5-325 mg/15 ml solution  Commonly known as:  HYCET  Take 15 mLs by mouth 4 (four) times daily as needed for moderate pain.     ondansetron 4 MG disintegrating tablet  Commonly known as:  ZOFRAN ODT  Take 1 tablet (4 mg total) by mouth every 8 (eight) hours as needed for nausea or vomiting.         Signed: Everette Rank 11/04/2015, 4:52 PM

## 2015-11-04 NOTE — Consult Note (Addendum)
Medical Consultation  Martie LeeVeronica L Kostka RUE:[id number] DOB: 05/29/1961 DOA: 11/01/2015 PCP: Maryruth HancockSALLIE PATEL, MD   Requesting physician: Dr Alva Garnetyner Date of consultation:  11/04/2015 Reason for consultation:  Hypoxia  Impression/Recommendations  54 year old very pleasant female status post bariatric surgery. Hospitalist was consulted for hypoxia.  1. Bilateral atelectasis: I repeated x-ray this morning which is consistent with bilateral atelectasis. This is likely the reason for her hypoxia. Her hypoxia actually has resolved. She is 99% on room air as documented. I would continue incentive spirometer. I discussed with the patient that she is at risk of pneumonia due to her atelectasis if she does not use her incentive spirometer. Patient voices that she understands. At this point there is no evidence of pneumonia and I do not think she needs antibiotics. Her low-grade fever was likely secondary to atelectasis.  2. Essential hypertension: Continue HCTZ    Chief Complaint: Cough  HPI:   This is a very pleasant 54 year old female status post bariatric surgery. Hospitalist service was consulted for hypoxia and cough. She received 1 dose of Zosyn for possible pneumonia. Repeat chest x-ray today shows bilateral atelectasis. Patient has no other complaints such as shortness of breath or chest pain.  Review of Systems  Constitutional: Negative for fever, chills weight loss HENT: Negative for ear pain, nosebleeds, congestion, facial swelling, rhinorrhea, neck pain, neck stiffness and ear discharge.   Respiratory: Positive for cough, no shortness of breath, no wheezing  Cardiovascular: Negative for chest pain, palpitations and leg swelling.  Gastrointestinal: Negative for heartburn, positive post operative abdominal pain, no vomiting, diarrhea or consitpation Genitourinary: Negative for dysuria, urgency, frequency, hematuria Musculoskeletal: Negative for back pain or joint pain Neurological: Negative for  dizziness, seizures, syncope, focal weakness,  numbness and headaches.  Hematological: Does not bruise/bleed easily.  Psychiatric/Behavioral: Negative for hallucinations, confusion, dysphoric mood   Past Medical History  Diagnosis Date  . Asthma     no issues over 1 yr  . Hypertension   . GERD (gastroesophageal reflux disease)   . History of hiatal hernia   . Difficult intubation    Past Surgical History  Procedure Laterality Date  . Heel spur surgery Bilateral   . Breast surgery  2000    Reduction  . Rotator cuff repair  2009  . Tonsillectomy    . Tubal ligation    . Colonoscopy N/A 04/08/2015    Procedure: COLONOSCOPY;  Surgeon: Midge Miniumarren Wohl, MD;  [facility];  Service: Gastroenterology;  Laterality: N/A;  . Esophagogastroduodenoscopy (egd) with propofol N/A 07/07/2015    Procedure: ESOPHAGOGASTRODUODENOSCOPY (EGD) WITH PROPOFOL;  Surgeon: Midge Miniumarren Wohl, MD;  [facility];  Service: Endoscopy;  Laterality: N/A;  . 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 She has never used smokeless tobacco. She reports that she drinks about 0.6 oz of alcohol per week. She reports that she does not use illicit drugs.  Allergies  Allergen Reactions  . Codeine Itching  . Hydrocodone Itching   History reviewed. No pertinent family history.  Prior to Admission medications   Medication Sig Start Date End Date Taking? Authorizing Provider  hydrochlorothiazide (HYDRODIURIL) 50 MG tablet Take 50 mg by mouth daily. AM   Yes Historical Provider, MD  enoxaparin (LOVENOX) 30 MG/0.3ML injection Inject 0.3 mLs (30 mg  total) into the skin daily. 11/02/15   Everette Rank, MD    Physical Exam: Blood pressure 122/82, pulse 85, temperature 97.7 F  (36.5 C), temperature source Oral, resp. rate 18, height  (1.651 m), weight 121.564 kg (268 lb), SpO2 99 %. @ Filed Weights   11/01/15 0938 11/01/15 1736  Weight: 116.121 kg (256 lb) 121.564 kg (268 lb)    Intake/Output Summary (Last 24 hours) at 11/04/15 1248 Last data filed at 11/04/15 1100  Gross per 24 hour  Intake   2779 ml  Output   1300 ml  Net   1479 ml     Constitutional: Appears well-developed and well-nourished. No distress. HENT: Normocephalic. Marland Kitchen Oropharynx is clear and moist.  Eyes: Conjunctivae and EOM are normal. PERRLA, no scleral icterus.  Neck: Normal ROM. Neck supple. No JVD. No tracheal deviation. CVS: RRR, S1/S2 +, no murmurs, no gallops, no carotid bruit.  Pulmonary: Effort and breath sounds normal, no stridor, rhonchi, wheezes, rales. Lungs are clear to auscultation bilaterally with faint crackles at the bases Abdominal: Generalized tenderness with good bowel sounds no rebound or guarding  Musculoskeletal: Normal range of motion. No edema and no tenderness.  Neuro: Alert. CN 2-12 grossly intact. No focal deficits. Skin: Skin is warm and dry. No rash noted. Psychiatric: Normal mood and affect.    Labs  Basic Metabolic Panel:  Recent Labs Lab 11/02/15 0447  NA 137  K 4.7  CL 104  CO2 28  GLUCOSE 184*  BUN 15  CREATININE 0.81  CALCIUM 9.1   Liver Function Tests:  Recent Labs Lab 11/02/15 0447  AST 882*  ALT 766*  ALKPHOS 71  BILITOT 0.4  PROT 7.1  ALBUMIN 3.6   No results for input(s): LIPASE, AMYLASE in the last 168 hours.  CBC:  Recent Labs Lab 11/03/15 1730  WBC 13.2*  NEUTROABS 10.9*  HGB 10.6*  HCT 34.0*  MCV 89.7  PLT 164   Cardiac Enzymes: No results for input(s): CKTOTAL, CKMB, CKMBINDEX, TROPONINI in the last 168 hours. BNP: Invalid input(s): POCBNP CBG: No results for input(s): GLUCAP in the last 168 hours.  Radiological Exams: Dg Chest 1 View  11/04/2015  CLINICAL DATA:  Pneumonia EXAM: CHEST  1 VIEW COMPARISON:  Yesterday FINDINGS: Unchanged hypoventilation with bibasilar hazy density correlating with atelectasis and small effusions on CT from yesterday. Normal heart size and mediastinal contours. No pneumothorax. IMPRESSION: Unchanged hypoventilation with bibasilar atelectasis. Electronically Signed   By: Marnee Spring M.D.   On: 11/04/2015 09:51   Ct Chest W Contrast  11/03/2015  CLINICAL DATA:  Fever. EXAM: CT CHEST, ABDOMEN WITH CONTRAST TECHNIQUE: Multidetector CT imaging of the chest, abdomen and pelvis was performed following the standard protocol during bolus administration of intravenous contrast. CONTRAST:  OMNIPAQUE IOHEXOL 300 MG/ML  SOLN COMPARISON:  None. FINDINGS: CT CHEST FINDINGS Neck base and axilla:  No mass or adenopathy.  Normal thyroid. Mediastinum/Nodes: Heart normal in size and configuration. Great vessels normal in caliber. Minimal plaque along the aortic arch. No mediastinal or hilar masses or pathologically enlarged lymph nodes. Lungs and pleura: Lung volumes are relatively low. There is atelectasis in the lower lobes, involving at least 50% of the left lower lobe and less of the right lower lobe. Mild atelectasis is noted at the base of the left upper lobe lingula and right middle lobe and in the right upper lobe adjacent to the oblique fissure. There is no evidence of pneumonia. No pulmonary edema. No lung  mass or suspicious nodule. There are minimal pleural effusions, left greater than right. No pneumothorax. CT ABDOMEN FINDINGS Gastrointestinal: Patient status post gastric surgery with formation of a gastrojejunostomy. There is stranding in the mesentery adjacent to the stomach and upper small bowel and transverse colon consistent with postoperative edema. A surgical drain extends along the lesser sac of the stomach and along the inferior and posterior margin of the lateral segment of the left liver lobe. There is no bowel dilation to suggest obstruction. There  is small amount of postsurgical air tracks along the abdominal wall. There is no fluid collection to suggest an abscess. Hepatobiliary: There is some decreased attenuation in the lateral segment of the left liver lobe, which may be due to edema from surgery. There is no convincing laceration. No liver mass. Liver normal in size and morphology. Gallbladder surgically absent. No bile duct dilation. Pancreas: Unremarkable Spleen: Normal. Adrenals/Urinary Tract: No renal mass or stone. No hydronephrosis. Symmetric renal enhancement and excretion. No adrenal masses. Vascular/Lymphatic: No pathologically enlarged lymph nodes. No ascites. MUSCULOSKELETAL FINDINGS Degenerative changes noted throughout the visualized spine. No osteoblastic or osteolytic lesions. IMPRESSION: 1. Status post gastric surgery with formation of a gastrojejunostomy. There are postsurgical changes with some edema noted in the lateral segment of the left liver lobe as well as within the upper abdomen mesentery. Postoperative air is noted along the abdominal wall. 2. There is no evidence of an operative complication. No evidence of an abscess. No evidence of bowel obstruction or ischemia. 3. There is significant atelectasis mostly of the lower lobes, left greater than right, with trace, left greater than right, pleural effusions. Electronically Signed   By: Amie Portland M.D.   On: 11/03/2015 16:50   Ct Abdomen W Contrast  11/03/2015  CLINICAL DATA:  Fever. EXAM: CT CHEST, ABDOMEN WITH CONTRAST TECHNIQUE: Multidetector CT imaging of the chest, abdomen and pelvis was performed following the standard protocol during bolus administration of intravenous contrast. CONTRAST:  OMNIPAQUE IOHEXOL 300 MG/ML  SOLN COMPARISON:  None. FINDINGS: CT CHEST FINDINGS Neck base and axilla:  No mass or adenopathy.  Normal thyroid. Mediastinum/Nodes: Heart normal in size and configuration. Great vessels normal in caliber. Minimal plaque along the aortic arch. No  mediastinal or hilar masses or pathologically enlarged lymph nodes. Lungs and pleura: Lung volumes are relatively low. There is atelectasis in the lower lobes, involving at least 50% of the left lower lobe and less of the right lower lobe. Mild atelectasis is noted at the base of the left upper lobe lingula and right middle lobe and in the right upper lobe adjacent to the oblique fissure. There is no evidence of pneumonia. No pulmonary edema. No lung mass or suspicious nodule. There are minimal pleural effusions, left greater than right. No pneumothorax. CT ABDOMEN FINDINGS Gastrointestinal: Patient status post gastric surgery with formation of a gastrojejunostomy. There is stranding in the mesentery adjacent to the stomach and upper small bowel and transverse colon consistent with postoperative edema. A surgical drain extends along the lesser sac of the stomach and along the inferior and posterior margin of the lateral segment of the left liver lobe. There is no bowel dilation to suggest obstruction. There is small amount of postsurgical air tracks along the abdominal wall. There is no fluid collection to suggest an abscess. Hepatobiliary: There is some decreased attenuation in the lateral segment of the left liver lobe, which may be due to edema from surgery. There is no convincing laceration. No liver mass.  Liver normal in size and morphology. Gallbladder surgically absent. No bile duct dilation. Pancreas: Unremarkable Spleen: Normal. Adrenals/Urinary Tract: No renal mass or stone. No hydronephrosis. Symmetric renal enhancement and excretion. No adrenal masses. Vascular/Lymphatic: No pathologically enlarged lymph nodes. No ascites. MUSCULOSKELETAL FINDINGS Degenerative changes noted throughout the visualized spine. No osteoblastic or osteolytic lesions. IMPRESSION: 1. Status post gastric surgery with formation of a gastrojejunostomy. There are postsurgical changes with some edema noted in the lateral segment of  the left liver lobe as well as within the upper abdomen mesentery. Postoperative air is noted along the abdominal wall. 2. There is no evidence of an operative complication. No evidence of an abscess. No evidence of bowel obstruction or ischemia. 3. There is significant atelectasis mostly of the lower lobes, left greater than right, with trace, left greater than right, pleural effusions. Electronically Signed   By: Amie Portland M.D.   On: 11/03/2015 16:50   Dg Chest Port 1 View  11/03/2015  CLINICAL DATA:  Cough, asthma. EXAM: PORTABLE CHEST 1 VIEW COMPARISON:  06/27/2015 P FINDINGS: Examination limited by body habitus. Extremely low lung volumes with vascular crowding and atelectasis. Marked eventration of the right hemidiaphragm. Heart size is accentuated but grossly normal. No obvious pleural effusions. IMPRESSION: Very low lung volumes with vascular crowding and bibasilar atelectasis. Electronically Signed   By: Rudie Meyer M.D.   On: 11/03/2015 14:27   Dg Kayleen Memos  W/kub  11/03/2015  CLINICAL DATA:  Gastric bypass 2 days ago. EXAM: WATER SOLUBLE UPPER GI SERIES TECHNIQUE: Single-column upper GI series was performed using water soluble contrast. CONTRAST:  Gastrografin. COMPARISON:  None. FLUOROSCOPY TIME:  Radiation Exposure Index (as provided by the fluoroscopic device): 54.9 mGy FINDINGS: This was a limited exam as patient could not tolerate the Gastrografin. Esophagus gastric remnant and proximal small bowel appear normal. No definite leakage identified, however again this is a limited exam as the patient could only drink a small amount of Gastrografin. IMPRESSION: Limited exam as the patient could not tolerate the Gastrografin well. No definite evidence of obstruction or leakage following gastric surgery . Electronically Signed   By: Maisie Fus  Register   On: 11/03/2015 12:29       Patient is okay to go home today.   Note: This dictation was prepared with Dragon dictation along with smaller phrase  technology. Any transcriptional errors that result from this process are unintentional.  Time spent: 45  Jorge Amparo, MD

## 2015-11-04 NOTE — Progress Notes (Addendum)
Pt is alert and oriented x 4, c/o abdominal pain improved with oxycodone, benadryl given with oxycodone for itching, on room air, ambulating in room, IV fluids infusing throughout shift, repeat chest xray showed atelectasis, tolerating clear liquid diet, denies n/v, no bm throughout shift but passing gas, pt is to f/u with Dr. Earley Abideymer as scheduled in 2 weeks, patient already has pain meds, zofran, and lovenox at home. Afebrile throughout shift, vss. Reviewed d/c instructions with patient, patient reports understanding of discharge instructions and has no further questions at this time.

## 2015-11-04 NOTE — Progress Notes (Signed)
Pt ambulated twice around the nursing station two times. Did very well. Pt is also using incentive spirometer 10 times per hour. Will continue to encourage pt.   Karsten RoLauren E Hobbs

## 2015-11-08 ENCOUNTER — Emergency Department
Admission: EM | Admit: 2015-11-08 | Discharge: 2015-11-08 | Disposition: A | Discharge status: Home / Self Care | Payer: BLUE CROSS/BLUE SHIELD | Attending: Emergency Medicine | Admitting: Emergency Medicine

## 2015-11-08 ENCOUNTER — Encounter: Payer: Self-pay | Admitting: Medical Oncology

## 2015-11-08 ENCOUNTER — Emergency Department: Payer: BLUE CROSS/BLUE SHIELD

## 2015-11-08 DIAGNOSIS — M62831 Muscle spasm of calf: Secondary | ICD-10-CM | POA: Diagnosis not present

## 2015-11-08 DIAGNOSIS — M79661 Pain in right lower leg: Secondary | ICD-10-CM | POA: Diagnosis present

## 2015-11-08 DIAGNOSIS — Z7901 Long term (current) use of anticoagulants: Secondary | ICD-10-CM | POA: Insufficient documentation

## 2015-11-08 DIAGNOSIS — I10 Essential (primary) hypertension: Secondary | ICD-10-CM | POA: Diagnosis not present

## 2015-11-08 DIAGNOSIS — M79604 Pain in right leg: Secondary | ICD-10-CM

## 2015-11-08 DIAGNOSIS — R600 Localized edema: Secondary | ICD-10-CM | POA: Insufficient documentation

## 2015-11-08 DIAGNOSIS — M7989 Other specified soft tissue disorders: Secondary | ICD-10-CM

## 2015-11-08 DIAGNOSIS — E119 Type 2 diabetes mellitus without complications: Secondary | ICD-10-CM | POA: Insufficient documentation

## 2015-11-08 LAB — BASIC METABOLIC PANEL
Anion gap: 7 (ref 5–15)
BUN: 6 mg/dL (ref 6–20)
CHLORIDE: 105 mmol/L (ref 101–111)
CO2: 28 mmol/L (ref 22–32)
CREATININE: 0.74 mg/dL (ref 0.44–1.00)
Calcium: 9.1 mg/dL (ref 8.9–10.3)
GFR calc non Af Amer: >60 mL/min (ref >60)
GLUCOSE: 95 mg/dL (ref 65–99)
Potassium: 3.4 mmol/L — ABNORMAL LOW (ref 3.5–5.1)
Sodium: 140 mmol/L (ref 135–145)

## 2015-11-08 LAB — CBC
HEMATOCRIT: 34.3 % — AB (ref 35.0–47.0)
HEMOGLOBIN: 11 g/dL — AB (ref 12.0–16.0)
MCH: 28.4 pg (ref 26.0–34.0)
MCHC: 32 g/dL (ref 32.0–36.0)
MCV: 88.7 fL (ref 80.0–100.0)
Platelets: 268 10*3/uL (ref 150–440)
RBC: 3.87 MIL/uL (ref 3.80–5.20)
RDW: 15.6 % — ABNORMAL HIGH (ref 11.5–14.5)
WBC: 9 10*3/uL (ref 3.6–11.0)

## 2015-11-08 NOTE — Discharge Instructions (Signed)
You were evaluated for right lower leg pain, and your exam and evaluation are reassuring. I suspect her symptoms are coming from muscle spasm of the muscles on the right side of your calf and shin. We discussed use heating pad, Epsom salt bath soaks, and massage to help release the muscle spasm.   Return to the emergency department for any new or worsening condition including any skin rash, redness, rash, worsening pain, weakness, numbness, or any other symptoms concerning to you.   Muscle Cramps and Spasms Muscle cramps and spasms are when muscles tighten by themselves. They usually get better within minutes. Muscle cramps are painful. They are usually stronger and last longer than muscle spasms. Muscle spasms may or may not be painful. They can last a few seconds or much longer. HOME CARE  Drink enough fluid to keep your pee (urine) clear or pale yellow.  Massage, stretch, and relax the muscle.  Use a warm towel, heating pad, or warm shower water on tight muscles.  Place ice on the muscle if it is tender or in pain.  Put ice in a plastic bag.  Place a towel between your skin and the bag.  Leave the ice on for 15-20 minutes, 03-04 times a day.  Only take medicine as told by your doctor. GET HELP RIGHT AWAY IF:  Your cramps or spasms get worse, happen more often, or do not get better with time. MAKE SURE YOU:  Understand these instructions.  Will watch your condition.  Will get help right away if you are not doing well or get worse.   This information is not intended to replace advice given to you by your health care provider. Make sure you discuss any questions you have with your health care provider.   Document Released: 11/01/2008 Document Revised: 03/16/2013 Document Reviewed: 11/05/2012 Elsevier Interactive Patient Education [employer]2016 [employer]

## 2015-11-08 NOTE — ED Notes (Signed)
Pt reports that she was discharged from the hospital Friday from gastric bypass surgery, since then pt has been having pain to back of rt calf. Pt reports that she is doing lovenox injections at home. Reports minimal sob.

## 2015-11-08 NOTE — ED Notes (Signed)
Pt resting in bed with only c/o pain in the right calf area. Site without warmth to touch. Friend at bedside.

## 2015-11-08 NOTE — ED Provider Notes (Signed)
[facility] Emergency Department Provider Note   ____________________________________________  Time seen:  I have reviewed the triage vital signs and the triage nursing note.  HISTORY  Chief Complaint Leg Pain   Historian Patient  HPI Erica Powers is a 54 y.o. female is here for evaluation of right lower extremity pain at the right calf and right lower shin. This is been there approximately one week since she had gastric bypass surgery. No new injury or overuse. No weakness or numbness. No redness or skin rash. There is some mild lower extremity swelling of both legs but right leg worse. No fevers. She is on Lovenox shots. Symptoms are mild to moderate. No trouble breathing or shortness of breath or chest pain. No aggravating or alleviating factors.   Past Medical History  Diagnosis Date  . Asthma     no issues over 1 yr  . Hypertension   . GERD (gastroesophageal reflux disease)   . History of hiatal hernia   . Difficult intubation     Patient Active Problem List   Diagnosis Date Noted  . Bariatric surgery status 11/01/2015  . Diabetes mellitus, type 2 (HCC) 07/13/2015  . Arthritis, degenerative 07/13/2015  . Preop examination   . Morbid obesity [facility])     Past Surgical History  Procedure Laterality Date  . Heel spur surgery Bilateral   . Breast surgery  2000    Reduction  . Rotator cuff repair  2009  . Tonsillectomy    . Tubal ligation    . Colonoscopy N/A 04/08/2015    Procedure: COLONOSCOPY;  Surgeon: Midge Minium, MD;  Location: [facility];  Service: Gastroenterology;  Laterality: N/A;  . Esophagogastroduodenoscopy (egd) with propofol N/A 07/07/2015    Procedure: ESOPHAGOGASTRODUODENOSCOPY (EGD) WITH PROPOFOL;  Surgeon: Midge Minium, MD;  Location: [facility];  Service: Endoscopy;  Laterality: N/A;  . Gastric roux-en-y N/A 11/01/2015    Procedure: LAPAROSCOPIC ROUX-EN-Y GASTRIC, HIATAL HERNIA REPAIR, EXTENSIVE LYSIS OF  ADHESIONS;  Surgeon: Everette Rank, MD;  Location: [facility] ORS;  Service: General;  Laterality: N/A;    Current Outpatient Rx  Name  Route  Sig  Dispense  Refill  . enoxaparin (LOVENOX) 30 MG/0.3ML injection   Subcutaneous   Inject 0.3 mLs (30 mg total) into the skin daily.   10 Syringe   0   . enoxaparin (LOVENOX) 30 MG/0.3ML injection   Subcutaneous   Inject 0.3 mLs (30 mg total) into the skin daily.   0 Syringe      . HYDROcodone-acetaminophen (HYCET) 7.5-325 mg/15 ml solution   Oral   Take 15 mLs by mouth 4 (four) times daily as needed for moderate pain.   120 mL   0   . ondansetron (ZOFRAN ODT) 4 MG disintegrating tablet   Oral   Take 1 tablet (4 mg total) by mouth every 8 (eight) hours as needed for nausea or vomiting.   20 tablet   0     Allergies Codeine and Hydrocodone  No family history on file.  Social History Social History  Substance Use Topics  . Smoking status: Never Smoker   . Smokeless tobacco: Never Used  . Alcohol Use: 0.6 oz/week    1 Glasses of wine per week    Review of Systems  Constitutional: Negative for fever. Eyes: Negative for visual changes. ENT: Negative for sore throat. Cardiovascular: Negative for chest pain. Respiratory: Negative for shortness of breath. Gastrointestinal: Negative for abdominal pain, vomiting and diarrhea. Genitourinary:  Negative for dysuria. Musculoskeletal: Negative for back pain. Skin: Negative for rash. Neurological: Negative for headache. 10 point Review of Systems otherwise negative ____________________________________________   PHYSICAL EXAM:  VITAL SIGNS: ED Triage Vitals  Enc Vitals Group     BP 11/08/15 1406 123/77 mmHg     Pulse Rate 11/08/15 1406 73     Resp 11/08/15 1406 20     Temp 11/08/15 1406 97.9 F (36.6 C)     Temp Source 11/08/15 1406 Oral     SpO2 11/08/15 1406 99 %     Weight 11/08/15 1406 260 lb (117.935 kg)     Height 11/08/15 1406 5\' 5"  (1.651 m)     Head Cir --       Peak Flow --      Pain Score 11/08/15 1412 7     Pain Loc --      Pain Edu? --      Excl. in GC? --      Constitutional: Alert and oriented. Well appearing and in no distress. Eyes: Conjunctivae are normal. PERRL. Normal extraocular movements. ENT   Head: Normocephalic and atraumatic.   Nose: No congestion/rhinnorhea.   Mouth/Throat: Mucous membranes are moist.   Neck: No stridor. Cardiovascular/Chest: Normal rate, regular rhythm.  No murmurs, rubs, or gallops. Respiratory: Normal respiratory effort without tachypnea nor retractions. Breath sounds are clear and equal bilaterally. No wheezes/rales/rhonchi. Gastrointestinal: Soft. No distention, no guarding, no rebound. Nontender  Genitourinary/rectal:Deferred Musculoskeletal: Nontender with normal range of motion in all extremities. No joint effusions.  Trace edema bilateral lower extremities. Muscular spasm/tightness and tenderness to palpation on the right lateral shin and calf. Neurologic:  Normal speech and language. No gross or focal neurologic deficits are appreciated. Skin:  Skin is warm, dry and intact. No rash noted. Psychiatric: Mood and affect are normal. Speech and behavior are normal. Patient exhibits appropriate insight and judgment.  ____________________________________________   EKG I, Governor Rooksebecca Shaneece Stockburger, MD, the attending physician have personally viewed and interpreted all ECGs.  None ____________________________________________  LABS (pertinent positives/negatives)  Hemoglobin 11, otherwise CBC without significant abnormality Basic metabolic panel without significant abnormality  ____________________________________________  RADIOLOGY All Xrays were viewed by me. Imaging interpreted by Radiologist.  Ultrasound right lower extremity:IMPRESSION: No evidence of right lower extremity deep venous thrombosis. Left common femoral vein also  patent. __________________________________________  PROCEDURES  Procedure(s) performed: None  Critical Care performed: None  ____________________________________________   ED COURSE / ASSESSMENT AND PLAN  CONSULTATIONS: None  Pertinent labs & imaging results that were available during my care of the patient were reviewed by me and considered in my medical decision making (see chart for details).   Patient's symptoms somewhat concerning for the possibility of lower extremity DVT. Ultrasound ruled out DVT.  Clinically she does have symptoms consistent with muscle spasm. We've discussed conservative treatment for this. I recommended follow-up with her primary care physician.  Patient / Family / Caregiver informed of clinical course, medical decision-making process, and agree with plan.   I discussed return precautions, follow-up instructions, and discharged instructions with patient and/or family.  ___________________________________________   FINAL CLINICAL IMPRESSION(S) / ED DIAGNOSES   Final diagnoses:  Muscle spasm of calf       Governor Rooksebecca Rafaelita Foister, MD 11/08/15 1649

## 2015-11-15 ENCOUNTER — Emergency Department: Payer: BLUE CROSS/BLUE SHIELD

## 2015-11-15 ENCOUNTER — Encounter: Payer: Self-pay | Admitting: Emergency Medicine

## 2015-11-15 ENCOUNTER — Inpatient Hospital Stay
Admission: EM | Admit: 2015-11-15 | Discharge: 2015-11-17 | DRG: 175 | Disposition: A | Discharge status: Home / Self Care | Payer: BLUE CROSS/BLUE SHIELD | Attending: Internal Medicine | Admitting: Internal Medicine

## 2015-11-15 DIAGNOSIS — J45909 Unspecified asthma, uncomplicated: Secondary | ICD-10-CM | POA: Diagnosis present

## 2015-11-15 DIAGNOSIS — K219 Gastro-esophageal reflux disease without esophagitis: Secondary | ICD-10-CM | POA: Diagnosis present

## 2015-11-15 DIAGNOSIS — Z9884 Bariatric surgery status: Secondary | ICD-10-CM

## 2015-11-15 DIAGNOSIS — Z79899 Other long term (current) drug therapy: Secondary | ICD-10-CM | POA: Diagnosis not present

## 2015-11-15 DIAGNOSIS — Z9889 Other specified postprocedural states: Secondary | ICD-10-CM | POA: Diagnosis not present

## 2015-11-15 DIAGNOSIS — I10 Essential (primary) hypertension: Secondary | ICD-10-CM | POA: Diagnosis present

## 2015-11-15 DIAGNOSIS — E119 Type 2 diabetes mellitus without complications: Secondary | ICD-10-CM | POA: Diagnosis present

## 2015-11-15 DIAGNOSIS — Z886 Allergy status to analgesic agent status: Secondary | ICD-10-CM | POA: Diagnosis not present

## 2015-11-15 DIAGNOSIS — I82402 Acute embolism and thrombosis of unspecified deep veins of left lower extremity: Secondary | ICD-10-CM | POA: Diagnosis present

## 2015-11-15 DIAGNOSIS — Z885 Allergy status to narcotic agent status: Secondary | ICD-10-CM | POA: Diagnosis not present

## 2015-11-15 DIAGNOSIS — R0902 Hypoxemia: Secondary | ICD-10-CM | POA: Diagnosis present

## 2015-11-15 DIAGNOSIS — Z9851 Tubal ligation status: Secondary | ICD-10-CM | POA: Diagnosis not present

## 2015-11-15 DIAGNOSIS — R748 Abnormal levels of other serum enzymes: Secondary | ICD-10-CM | POA: Diagnosis present

## 2015-11-15 DIAGNOSIS — Z833 Family history of diabetes mellitus: Secondary | ICD-10-CM

## 2015-11-15 DIAGNOSIS — Z86711 Personal history of pulmonary embolism: Secondary | ICD-10-CM

## 2015-11-15 DIAGNOSIS — Z8249 Family history of ischemic heart disease and other diseases of the circulatory system: Secondary | ICD-10-CM

## 2015-11-15 DIAGNOSIS — I2699 Other pulmonary embolism without acute cor pulmonale: Secondary | ICD-10-CM | POA: Diagnosis present

## 2015-11-15 DIAGNOSIS — R0600 Dyspnea, unspecified: Secondary | ICD-10-CM | POA: Diagnosis not present

## 2015-11-15 DIAGNOSIS — I2609 Other pulmonary embolism with acute cor pulmonale: Principal | ICD-10-CM | POA: Diagnosis present

## 2015-11-15 DIAGNOSIS — Z6841 Body Mass Index (BMI) 40.0 and over, adult: Secondary | ICD-10-CM | POA: Diagnosis not present

## 2015-11-15 LAB — CBC
HCT: 37.8 % (ref 35.0–47.0)
HEMOGLOBIN: 12.1 g/dL (ref 12.0–16.0)
MCH: 28.2 pg (ref 26.0–34.0)
MCHC: 32 g/dL (ref 32.0–36.0)
MCV: 88.3 fL (ref 80.0–100.0)
Platelets: 288 10*3/uL (ref 150–440)
RBC: 4.28 MIL/uL (ref 3.80–5.20)
RDW: 16 % — ABNORMAL HIGH (ref 11.5–14.5)
WBC: 13.7 10*3/uL — ABNORMAL HIGH (ref 3.6–11.0)

## 2015-11-15 LAB — BASIC METABOLIC PANEL
ANION GAP: 15 (ref 5–15)
BUN: 14 mg/dL (ref 6–20)
CO2: 27 mmol/L (ref 22–32)
Calcium: 9.3 mg/dL (ref 8.9–10.3)
Chloride: 100 mmol/L — ABNORMAL LOW (ref 101–111)
Creatinine, Ser: 0.74 mg/dL (ref 0.44–1.00)
Glucose, Bld: 116 mg/dL — ABNORMAL HIGH (ref 65–99)
POTASSIUM: 3.4 mmol/L — AB (ref 3.5–5.1)
SODIUM: 142 mmol/L (ref 135–145)

## 2015-11-15 LAB — MRSA PCR SCREENING: MRSA by PCR: NEGATIVE

## 2015-11-15 LAB — PROTIME-INR
INR: 1.17
PROTHROMBIN TIME: 15.1 s — AB (ref 11.4–15.0)

## 2015-11-15 LAB — TROPONIN I: TROPONIN I: 0.14 ng/mL — AB (ref <0.031)

## 2015-11-15 LAB — APTT: aPTT: 29 seconds (ref 24–36)

## 2015-11-15 LAB — GLUCOSE, CAPILLARY: GLUCOSE-CAPILLARY: 91 mg/dL (ref 65–99)

## 2015-11-15 MED ORDER — ONDANSETRON HCL 4 MG/2ML IJ SOLN: 4.0000 mg | Freq: Four times a day (QID) | INTRAMUSCULAR | Status: DC | PRN

## 2015-11-15 MED ORDER — ALBUTEROL SULFATE (2.5 MG/3ML) 0.083% IN NEBU: 2.5000 mg | INHALATION_SOLUTION | RESPIRATORY_TRACT | Status: DC | PRN

## 2015-11-15 MED ORDER — HYDROCODONE-ACETAMINOPHEN 7.5-325 MG/15ML PO SOLN: 15.0000 mL | Freq: Four times a day (QID) | ORAL | Status: DC | PRN

## 2015-11-15 MED ORDER — ACETAMINOPHEN 650 MG RE SUPP: 650.0000 mg | Freq: Four times a day (QID) | RECTAL | Status: DC | PRN

## 2015-11-15 MED ORDER — SODIUM CHLORIDE 0.9 % IV BOLUS (SEPSIS)
1000.0000 mL | Freq: Once | INTRAVENOUS | Status: AC
  Administered 2015-11-15: 1000 mL via INTRAVENOUS

## 2015-11-15 MED ORDER — SODIUM CHLORIDE 0.9 % IJ SOLN
3.0000 mL | Freq: Two times a day (BID) | INTRAMUSCULAR | Status: DC
  Administered 2015-11-15 – 2015-11-17 (×2): 3 mL via INTRAVENOUS

## 2015-11-15 MED ORDER — SODIUM CHLORIDE 0.9 % IV SOLN: 250.0000 mL | INTRAVENOUS | Status: DC | PRN

## 2015-11-15 MED ORDER — INSULIN ASPART 100 UNIT/ML ~~LOC~~ SOLN: 0.0000 [IU] | Freq: Three times a day (TID) | SUBCUTANEOUS | Status: DC

## 2015-11-15 MED ORDER — ONDANSETRON HCL 4 MG PO TABS: 4.0000 mg | ORAL_TABLET | Freq: Four times a day (QID) | ORAL | Status: DC | PRN

## 2015-11-15 MED ORDER — SODIUM CHLORIDE 0.9 % IJ SOLN: 3.0000 mL | INTRAMUSCULAR | Status: DC | PRN

## 2015-11-15 MED ORDER — FLEET ENEMA 7-19 GM/118ML RE ENEM: 1.0000 | ENEMA | Freq: Once | RECTAL | Status: DC | PRN

## 2015-11-15 MED ORDER — ACETAMINOPHEN 325 MG PO TABS
650.0000 mg | ORAL_TABLET | Freq: Four times a day (QID) | ORAL | Status: DC | PRN
  Administered 2015-11-15: 650 mg via ORAL
  Filled 2015-11-15 (×2): qty 2

## 2015-11-15 MED ORDER — ALBUTEROL SULFATE (2.5 MG/3ML) 0.083% IN NEBU
2.5000 mg | INHALATION_SOLUTION | Freq: Once | RESPIRATORY_TRACT | Status: AC
  Administered 2015-11-15: 2.5 mg via RESPIRATORY_TRACT
  Filled 2015-11-15: qty 3

## 2015-11-15 MED ORDER — INSULIN ASPART 100 UNIT/ML ~~LOC~~ SOLN: 0.0000 [IU] | Freq: Every day | SUBCUTANEOUS | Status: DC

## 2015-11-15 MED ORDER — HYDROCHLOROTHIAZIDE 25 MG PO TABS
50.0000 mg | ORAL_TABLET | Freq: Every day | ORAL | Status: DC
  Administered 2015-11-15 – 2015-11-17 (×3): 50 mg via ORAL
  Filled 2015-11-15 (×3): qty 2

## 2015-11-15 MED ORDER — IOHEXOL 350 MG/ML SOLN
80.0000 mL | Freq: Once | INTRAVENOUS | Status: AC | PRN
  Administered 2015-11-15: 80 mL via INTRAVENOUS

## 2015-11-15 MED ORDER — POLYETHYLENE GLYCOL 3350 17 G PO PACK: 17.0000 g | PACK | Freq: Every day | ORAL | Status: DC | PRN

## 2015-11-15 MED ORDER — SODIUM CHLORIDE 0.9 % IJ SOLN
3.0000 mL | Freq: Two times a day (BID) | INTRAMUSCULAR | Status: DC
  Administered 2015-11-15 – 2015-11-16 (×2): 3 mL via INTRAVENOUS

## 2015-11-15 MED ORDER — HEPARIN (PORCINE) IN NACL 100-0.45 UNIT/ML-% IJ SOLN
1500.0000 [IU]/h | INTRAMUSCULAR | Status: AC
  Administered 2015-11-15: 1100 [IU]/h via INTRAVENOUS
  Administered 2015-11-16: 1350 [IU]/h via INTRAVENOUS
  Administered 2015-11-17: 1500 [IU]/h via INTRAVENOUS
  Filled 2015-11-15 (×6): qty 250

## 2015-11-15 MED ORDER — HEPARIN BOLUS VIA INFUSION
4000.0000 [IU] | Freq: Once | INTRAVENOUS | Status: AC
  Administered 2015-11-15: 4000 [IU] via INTRAVENOUS
  Filled 2015-11-15: qty 4000

## 2015-11-15 MED ORDER — BISACODYL 5 MG PO TBEC: 5.0000 mg | DELAYED_RELEASE_TABLET | Freq: Every day | ORAL | Status: DC | PRN

## 2015-11-15 NOTE — ED Notes (Signed)
Patient transported to CT 

## 2015-11-15 NOTE — ED Notes (Signed)
Pt post gastric bypass on 11/29 admitted for 3 days and sent home. Pt has been doing fine until  Yesterday when she developed a cough and shortness of breath. Pt sent over from urgent care for further eval of possible blood clot.

## 2015-11-15 NOTE — ED Provider Notes (Signed)
[facility] Emergency Department Provider Note  ____________________________________________  Time seen: Approximately 510 PM  I have reviewed the triage vital signs and the nursing notes.   HISTORY  Chief Complaint Shortness of Breath    HPI Erica Powers is a 54 y.o. female with a recent gastric bypass surgery in late November and was presenting today with chest pain and shortness of breath of sudden onset one day ago. It is been associated with a cough as well as a runny nose. She also says that she is associated left-sided chest pain that is worse with coughing. She says she has a history of asthma but has not had a flare and "a very long time." She was seen previously in the emergency department for right leg pain and had a negative Doppler ultrasound.Patient was seen earlier today at the [facility] health center and sent in to the emergency department for further evaluation.   Past Medical History  Diagnosis Date  . Asthma     no issues over 1 yr  . Hypertension   . GERD (gastroesophageal reflux disease)   . History of hiatal hernia   . Difficult intubation     Patient Active Problem List   Diagnosis Date Noted  . Bariatric surgery status 11/01/2015  . Diabetes mellitus, type 2 (HCC) 07/13/2015  . Arthritis, degenerative 07/13/2015  . Preop examination   . Morbid obesity [facility])     Past Surgical History  Procedure Laterality Date  . Heel spur surgery Bilateral   . Breast surgery  2000    Reduction  . Rotator cuff repair  2009  . Tonsillectomy    . Tubal ligation    . Colonoscopy N/A 04/08/2015    Procedure: COLONOSCOPY;  Surgeon: Midge Minium, MD;  Location: [facility];  Service: Gastroenterology;  Laterality: N/A;  . Esophagogastroduodenoscopy (egd) with propofol N/A 07/07/2015    Procedure: ESOPHAGOGASTRODUODENOSCOPY (EGD) WITH PROPOFOL;  Surgeon: Midge Minium, MD;  Location: [facility];  Service: Endoscopy;  Laterality:  N/A;  . Gastric roux-en-y N/A 11/01/2015    Procedure: LAPAROSCOPIC ROUX-EN-Y GASTRIC, HIATAL HERNIA REPAIR, EXTENSIVE LYSIS OF ADHESIONS;  Surgeon: Everette Rank, MD;  Location: [facility] ORS;  Service: General;  Laterality: N/A;    Current Outpatient Rx  Name  Route  Sig  Dispense  Refill  . enoxaparin (LOVENOX) 30 MG/0.3ML injection   Subcutaneous   Inject 0.3 mLs (30 mg total) into the skin daily.   10 Syringe   0   . enoxaparin (LOVENOX) 30 MG/0.3ML injection   Subcutaneous   Inject 0.3 mLs (30 mg total) into the skin daily.   0 Syringe      . HYDROcodone-acetaminophen (HYCET) 7.5-325 mg/15 ml solution   Oral   Take 15 mLs by mouth 4 (four) times daily as needed for moderate pain.   120 mL   0   . ondansetron (ZOFRAN ODT) 4 MG disintegrating tablet   Oral   Take 1 tablet (4 mg total) by mouth every 8 (eight) hours as needed for nausea or vomiting.   20 tablet   0     Allergies Codeine and Hydrocodone  No family history on file.  Social History Social History  Substance Use Topics  . Smoking status: Never Smoker   . Smokeless tobacco: Never Used  . Alcohol Use: 0.6 oz/week    1 Glasses of wine per week    Review of Systems Constitutional: No fever/chills Eyes: No visual changes. ENT:  No sore throat. Cardiovascular: As above Respiratory: As above Gastrointestinal: No abdominal pain.  No nausea, no vomiting.  No diarrhea.  No constipation. Genitourinary: Negative for dysuria. Musculoskeletal: Negative for back pain. Skin: Negative for rash. Neurological: Negative for headaches, focal weakness or numbness.  10-point ROS otherwise negative.  ____________________________________________   PHYSICAL EXAM:  VITAL SIGNS: ED Triage Vitals  Enc Vitals Group     BP 11/15/15 1644 143/70 mmHg     Pulse Rate 11/15/15 1644 102     Resp 11/15/15 1644 22     Temp 11/15/15 1644 99.2 F (37.3 C)     Temp Source 11/15/15 1644 Oral     SpO2 11/15/15 1644 93 %      Weight 11/15/15 1644 145 lb (65.772 kg)     Height 11/15/15 1644  (1.651 m)     Head Cir --      Peak Flow --      Pain Score 11/15/15 1645 4     Pain Loc --      Pain Edu? --      Excl. in GC? --     Constitutional: Alert and oriented. Well appearing and in no acute distress. Eyes: Conjunctivae are normal. PERRL. EOMI. Head: Atraumatic. Nose: No congestion/rhinnorhea. Mouth/Throat: Mucous membranes are moist.  Oropharynx non-erythematous. Neck: No stridor.   Cardiovascular: Tachycardic, regular rhythm. Grossly normal heart sounds.  Good peripheral circulation. Respiratory: Pink.  No retractions. Decreased air movement throughout but clear to auscultation without any wheezing rales or rhonchi. Gastrointestinal: Soft and nontender. No distention. No abdominal bruits. No CVA tenderness. Musculoskeletal: Mild swelling to the bilateral lower extremities with pitting edema.  No joint effusions. Neurologic:  Normal speech and language. No gross focal neurologic deficits are appreciated. No gait instability. Skin:  Skin is warm, dry and intact. No rash noted. Psychiatric: Mood and affect are normal. Speech and behavior are normal.  ____________________________________________   LABS (all labs ordered are listed, but only abnormal results are displayed)  Labs Reviewed  BASIC METABOLIC PANEL - Abnormal; Notable for the following:    Potassium 3.4 (*)    Chloride 100 (*)    Glucose, Bld 116 (*)    All other components within normal limits  TROPONIN I - Abnormal; Notable for the following:    Troponin I 0.14 (*)    All other components within normal limits  CBC - Abnormal; Notable for the following:    WBC 13.7 (*)    RDW 16.0 (*)    All other components within normal limits   ____________________________________________  EKG  ED ECG REPORT I, [name], the attending physician, personally viewed and interpreted this ECG.   Date: 11/15/2015  EKG Time: 1648   Rate: 102  Rhythm: sinus tachycardia  Axis: Normal axis  Intervals:none  ST&T Change: No ST segment elevation or depression. No abnormal T-wave inversion.  ____________________________________________  RADIOLOGY  Chest x-ray with left pleural effusion with left basilar airspace disease which could be atelectasis or pneumonia. CT PE is positive for bilateral pulmonary emboli. Evidence of right heart strain is present. At least some massive intermediate risk PE present. ____________________________________________   PROCEDURES  CRITICAL CARE Performed by: [name]   Total critical care time: 35 minutes  Critical care time was exclusive of separately billable procedures and treating other patients.  Critical care was necessary to treat or prevent imminent or life-threatening deterioration.  Critical care was time spent personally by me on  the following activities: development of treatment plan with patient and/or surrogate as well as nursing, discussions with consultants, evaluation of patient's response to treatment, examination of patient, obtaining history from patient or surrogate, ordering and performing treatments and interventions, ordering and review of laboratory studies, ordering and review of radiographic studies, pulse oximetry and re-evaluation of patient's condition.   ____________________________________________   INITIAL IMPRESSION / ASSESSMENT AND PLAN / ED COURSE  Pertinent labs & imaging results that were available during my care of the patient were reviewed by me and considered in my medical decision making (see chart for details).  ----------------------------------------- 6:46 PM on 11/15/2015 -----------------------------------------  Counseled patient and her sisters regarding the lab findings.  ----------------------------------------- 6:57 PM on 11/15/2015 -----------------------------------------  Had a lengthy discussion with the  pulmonologist, Dr. Vaughan BastaSummer, at [facility] regarding transfer for interventional radiology procedures. After reviewing the labs as well as the patient's imaging and vital signs Dr. Arsenio LoaderSommer does not think the patient needs emergency [facility] to [facility]  at this time. He recommends admission at [facility]. He says he will discuss the case with pulmonology here, Dr.Ramachandran.  He agrees with starting IV heparin.  Discussed this plan with the patient as well as the family understand the plan and are willing to comply. Signed out to Dr. Elpidio AnisSudini of the medicine service. The patient denies any rectal bleeding at this time. ____________________________________________   FINAL CLINICAL IMPRESSION(S) / ED DIAGNOSES  Submassive pulmonary emboli.    Myrna Blazeravid Matthew Schaevitz, MD 11/15/15 1900

## 2015-11-15 NOTE — Progress Notes (Signed)
ANTICOAGULATION CONSULT NOTE - Initial Consult  Pharmacy Consult for Heparin Indication: pulmonary embolus  Allergies  Allergen Reactions  . Codeine Itching  . Hydrocodone Itching    Patient Measurements: Height: 5\' 5"  (165.1 cm) Weight: 145 lb (65.772 kg) IBW/kg (Calculated) : 57 Heparin Dosing Weight: 65.8 kg  Vital Signs: Temp: 99.2 F (37.3 C) (12/13 1644) Temp Source: Oral (12/13 1644) BP: 147/81 mmHg (12/13 1854) Pulse Rate: 102 (12/13 1854)  Labs:  Recent Labs  11/15/15 1648  HGB 12.1  HCT 37.8  PLT 288  CREATININE 0.74  TROPONINI 0.14*    Estimated Creatinine Clearance: 72.3 mL/min (by C-G formula based on Cr of 0.74).   Medical History: Past Medical History  Diagnosis Date  . Asthma     no issues over 1 yr  . Hypertension   . GERD (gastroesophageal reflux disease)   . History of hiatal hernia   . Difficult intubation   . Diabetes [facility](HCC)      Assessment: 54 yo female here with bilateral PE with right heart strain. Hgb 12.1, Plt 288 APTT and INR ordered - no oral anticoagulant noted PTA  Goal of Therapy:  Heparin level 0.3-0.7 units/ml Monitor platelets by anticoagulation protocol: Yes   Plan:  Will order heparin 4000 units IV x1 then 1100 units/hr heparin drip Will order anti-Xa level in 6h at 0230 on 12/14 Will order CBC for AM - will need to continue to monitor s/sx of bleeding  Pharmacy will continue to follow.   Crist FatWang, Timithy Arons L 11/15/2015,7:19 PM

## 2015-11-15 NOTE — Progress Notes (Signed)
eLink Physician-Brief Progress Note Patient Name: Erica LeeVeronica L Powers DOB: 06/12/1961 MRN: [id number]   Date of Service  11/15/2015  HPI/Events of Note  54 yo female with PMH of morbid obesity s/p recent gastric bypass. Presents with SOB. Chest CTA reveals submassive PE. BP = 147/81. Sat high 90's on 2.0 L/min [place] O2. Meets criteria for PCCM consult d/t positive troponin (0.14). However, doesn't meet criteria for systemic tPA or EKOS at this time. Recommend: 1. Heparin IV infusion. 2. Cardiac Echo. 3. Admit to Hospitalist Service - step down bed. If O2 requirement increased, he becomes hemodynamically unstable or has R heart failure on echo will re-evaluate for [facility].   eICU Interventions  Continue Rx as above.     Intervention Category Evaluation Type: New Patient Evaluation  Lenell AntuSommer,Alaine Loughney Eugene 11/15/2015, 10:21 PM

## 2015-11-15 NOTE — H&P (Signed)
[facility] [place] [facility]   PATIENT NAME: Erica ArmsVeronica Powers    MR#:  [id number]  DATE OF BIRTH:  12/07/1960  DATE OF ADMISSION:  11/15/2015  PRIMARY CARE PHYSICIAN: Maryruth HancockSALLIE PATEL, MD   REQUESTING/REFERRING PHYSICIAN: Dr. Raynelle CharySchavitz  CHIEF COMPLAINT:   Chief Complaint  Patient presents with  . Shortness of Breath    HISTORY OF PRESENT ILLNESS:  Erica ArmsVeronica Powers  is a 54 y.o. female with a known history of hypertension, diabetes, morbid obesity with recent bariatric surgery 2 weeks back presents to the emergency room complaining of 2 days of shortness of breath and dry cough. Patient has also had bilateral lower chest pleuritic chest pain. She has also had nausea since surgery which is controlled with Zofran. No orthopnea or edema. Patient was on Lovenox during her 3 day stay for the bariatric surgery. She had some shortness of breath and hypoxic respiratory failure during the hospital stay which was thought to be due to atelectasis and resolved by the time of discharge. Patient had some pain in her right lower extremity for which she had lower extremity Dopplers on 11/08/2015 which showed no DVT. No history of DVT or PE.  Patient's CTA of the chest showed bilateral PE with RV strain. Case was discussed Dr. Levada SchillingSummers of the pulmonary critical care [facility] by the ER physician. Advised against TPA. 8. [facility] and consult pulmonary.   PAST MEDICAL HISTORY:   Past Medical History  Diagnosis Date  . Asthma     no issues over 1 yr  . Hypertension   . GERD (gastroesophageal reflux disease)   . History of hiatal hernia   . Difficult intubation   . Diabetes (HCC)     PAST SURGICAL HISTORY:   Past Surgical History  Procedure Laterality Date  . Heel spur surgery Bilateral   . Breast surgery  2000    Reduction  . Rotator cuff repair  2009  . Tonsillectomy    . Tubal ligation    . Colonoscopy N/A 04/08/2015    Procedure:  COLONOSCOPY;  Surgeon: Midge Miniumarren Wohl, MD;  [facility];  Service: Gastroenterology;  Laterality: N/A;  . Esophagogastroduodenoscopy (egd) with propofol N/A 07/07/2015    Procedure: ESOPHAGOGASTRODUODENOSCOPY (EGD) WITH PROPOFOL;  Surgeon: Midge Miniumarren Wohl, MD;  [facility];  Service: Endoscopy;  Laterality: N/A;  . Gastric roux-en-y N/A 11/01/2015    Procedure: LAPAROSCOPIC ROUX-EN-Y GASTRIC, HIATAL HERNIA REPAIR, EXTENSIVE LYSIS OF ADHESIONS;  Surgeon: Everette RankMichael A Tyner, MD;  Location: [facility];  Service: General;  Laterality: N/A;    SOCIAL HISTORY:   Social History  Substance Use Topics  . Smoking status: Never Smoker   . Smokeless tobacco: Never Used  . Alcohol Use: 0.6 oz/week    1 Glasses of wine per week    FAMILY HISTORY:   Family History  Problem Relation Age of Onset  . Diabetes Mother   . Hypertension Mother     DRUG ALLERGIES:   Allergies  Allergen Reactions  . Codeine Itching  . Hydrocodone Itching    REVIEW OF SYSTEMS:   Review of Systems  Constitutional: Positive for malaise/fatigue. Negative for fever, chills and weight loss.  HENT: Negative for hearing loss and nosebleeds.   Eyes: Negative for blurred vision, double vision and pain.  Respiratory: Positive for cough and shortness of breath. Negative for hemoptysis, sputum production and wheezing.   Cardiovascular: Positive for chest pain (pleuritic). Negative for palpitations, orthopnea  and leg swelling.  Gastrointestinal: Positive for nausea and constipation. Negative for vomiting, abdominal pain and diarrhea.  Genitourinary: Negative for dysuria and hematuria.  Musculoskeletal: Negative for myalgias, back pain and falls.  Skin: Negative for rash.  Neurological: Positive for weakness. Negative for dizziness, tremors, sensory change, speech change, focal weakness, seizures and headaches.  Endo/Heme/Allergies: Does not bruise/bleed easily.  Psychiatric/Behavioral: Negative for  depression and memory loss. The patient is not nervous/anxious.     MEDICATIONS AT HOME:   Prior to Admission medications   Medication Sig Start Date End Date Taking? Authorizing Provider  enoxaparin (LOVENOX) 30 MG/0.3ML injection Inject 0.3 mLs (30 mg total) into the skin daily. 11/02/15   Everette Rank, MD  enoxaparin (LOVENOX) 30 MG/0.3ML injection Inject 0.3 mLs (30 mg total) into the skin daily. 11/04/15   Everette Rank, MD  HYDROcodone-acetaminophen (HYCET) 7.5-325 mg/15 ml solution Take 15 mLs by mouth 4 (four) times daily as needed for moderate pain. 11/04/15 11/03/16  Everette Rank, MD  ondansetron (ZOFRAN ODT) 4 MG disintegrating tablet Take 1 tablet (4 mg total) by mouth every 8 (eight) hours as needed for nausea or vomiting. 11/04/15   Everette Rank, MD      VITAL SIGNS:  Blood pressure 116/86, pulse 95, temperature 99.2 F (37.3 C), temperature source Oral, resp. rate 24, height  (1.651 m), weight 65.772 kg (145 lb), SpO2 92 %.  PHYSICAL EXAMINATION:  Physical Exam  GENERAL:  54 y.o.-year-old patient lying in the bed with no acute distress. Morbidly obese EYES: Pupils equal, round, reactive to light and accommodation. No scleral icterus. Extraocular muscles intact.  HEENT: Head atraumatic, normocephalic. Oropharynx and nasopharynx clear. No oropharyngeal erythema, moist oral mucosa  NECK:  Supple, no jugular venous distention. No thyroid enlargement, no tenderness.  LUNGS: Normal breath sounds bilaterally, no wheezing, rales, rhonchi. No use of accessory muscles of respiration. Increased work of breathing CARDIOVASCULAR: S1, S2 normal. No murmurs, rubs, or gallops.  ABDOMEN: Soft, nontender, nondistended. Bowel sounds present. No organomegaly or mass. Scars from recent surgery healed well. EXTREMITIES: No pedal edema, cyanosis, or clubbing. + 2 pedal & radial pulses b/l.   NEUROLOGIC: Cranial nerves II through XII are intact. No focal Motor or sensory deficits  appreciated b/l PSYCHIATRIC: The patient is alert and oriented x 3. Good affect.  SKIN: No obvious rash, lesion, or ulcer.   LABORATORY PANEL:   CBC  Recent Labs Lab 11/15/15 1648  WBC 13.7*  HGB 12.1  HCT 37.8  PLT 288   ------------------------------------------------------------------------------------------------------------------  Chemistries   Recent Labs Lab 11/15/15 1648  NA 142  K 3.4*  CL 100*  CO2 27  GLUCOSE 116*  BUN 14  CREATININE 0.74  CALCIUM 9.3   ------------------------------------------------------------------------------------------------------------------  Cardiac Enzymes  Recent Labs Lab 11/15/15 1648  TROPONINI 0.14*   ------------------------------------------------------------------------------------------------------------------  RADIOLOGY:  Dg Chest 2 View  11/15/2015  CLINICAL DATA:  Cough and shortness of breath beginning yesterday. Status post gastric bypass 3 days ago. Initial encounter. EXAM: CHEST  2 VIEW COMPARISON:  Single view of the chest 11/04/2015 and 11/03/2015. CT chest, abdomen and pelvis 11/03/2015. FINDINGS: There is airspace disease in the left lower lobe. The right lung is clear. Mild elevation of the right hemidiaphragm relative to the left is noted. There is likely a small left pleural effusion. No pneumothorax. Heart size is normal. IMPRESSION: Left pleural effusion with associated left basilar airspace disease which could be due to atelectasis or pneumonia. Electronically Signed  By: Drusilla Kanner M.D.   On: 11/15/2015 17:32   Ct Angio Chest Pe W/cm &/or Wo Cm  11/15/2015  CLINICAL DATA:  Post gastric bypass surgery 11/01/2015, onset of cough, shortness of breath, tachycardia and hypoxia, question pulmonary embolism EXAM: CT ANGIOGRAPHY CHEST WITH CONTRAST TECHNIQUE: Multidetector CT imaging of the chest was performed using the standard protocol during bolus administration of intravenous contrast. Multiplanar CT  image reconstructions and MIPs were obtained to evaluate the vascular anatomy. CONTRAST:  80mL OMNIPAQUE IOHEXOL 350 MG/ML SOLN IV COMPARISON:  CT chest 11/03/2015 FINDINGS: Aorta normal caliber without aneurysm or dissection. Pulmonary arteries well opacified. Large filling defects identified within pulmonary arteries bilaterally compatible with pulmonary embolism. These include emboli within proximal LEFT upper lobe, lingula, and BILATERAL lower lobe pulmonary arteries. Additional small embolus into RIGHT upper lobe pulmonary artery. Mild dilatation of the RIGHT ventricle with elevated RV/LV ratio = 1.21 compatible with at least submassive pulmonary embolism. Postsurgical changes of gastric bypass. Gallbladder surgically absent. Remaining visualized upper abdomen unremarkable. No thoracic adenopathy. Significant atelectasis LEFT lower lobe with small LEFT pleural effusion. Linear subsegmental atelectasis RIGHT lower lobe. Remaining lungs clear. No pneumothorax or acute osseous findings. Review of the MIP images confirms the above findings. IMPRESSION: BILATERAL pulmonary emboli. Positive for acute PE with CT evidence of right heart strain (RV/LV Ratio = 1.21) consistent with at least submassive (intermediate risk) PE. The presence of right heart strain has been associated with an increased risk of morbidity and mortality. Critical Value/emergent results were called by telephone at the time of interpretation on 11/15/2015 at 1835 hr to Dr. Gladstone Pih , who verbally acknowledged these results. Electronically Signed   By: Ulyses Southward M.D.   On: 11/15/2015 18:38     IMPRESSION AND PLAN:   * Acute bilateral pulmonary emboli with right ventricular strain - provoked by recent surgery. Normal blood pressure and heart rate. Patient does have acute hypoxic respiratory failure. Dr. Levada Schilling AND pulmonary critical care at [facility] has advised to admit patient at [facility]. No TPA or IR  procedures. Consult pulmonary. Check lower extremity Dopplers and echocardiogram. Start heparin drip.  * Elevated troponin likely from RV strain. Check echocardiogram. Repeat troponin.  * Diabetes mellitus Sliding scale insulin and diabetic diet.  * Hypertension Continue home medications  * Recent bariatric surgery. Patient is healing well.   All the records are reviewed and case discussed with ED provider. Management plans discussed with the patient, family and they are in agreement.  CODE STATUS: FULL  TOTAL TIME TAKING CARE OF THIS PATIENT: 45 minutes.    Milagros Loll R M.D on 11/15/2015 at 7:34 PM  Between 7am to 6pm - Pager - [phone]  After 6pm go to [email or web address] - password [email or web address]  [facility]  Office  [phone]  CC: Primary care physician; Maryruth Hancock, MD     Note: This dictation was prepared with Dragon dictation along with smaller phrase technology. Any transcriptional errors that result from this process are unintentional.

## 2015-11-16 ENCOUNTER — Inpatient Hospital Stay: Payer: BLUE CROSS/BLUE SHIELD

## 2015-11-16 ENCOUNTER — Inpatient Hospital Stay (HOSPITAL_COMMUNITY)
Admit: 2015-11-16 | Discharge: 2015-11-16 | Disposition: A | Discharge status: Home / Self Care | Payer: BLUE CROSS/BLUE SHIELD | Attending: Internal Medicine | Admitting: Internal Medicine

## 2015-11-16 DIAGNOSIS — I82402 Acute embolism and thrombosis of unspecified deep veins of left lower extremity: Secondary | ICD-10-CM

## 2015-11-16 DIAGNOSIS — I2699 Other pulmonary embolism without acute cor pulmonale: Secondary | ICD-10-CM

## 2015-11-16 DIAGNOSIS — R0600 Dyspnea, unspecified: Secondary | ICD-10-CM

## 2015-11-16 LAB — CBC
HEMATOCRIT: 34.4 % — AB (ref 35.0–47.0)
HEMOGLOBIN: 10.9 g/dL — AB (ref 12.0–16.0)
MCH: 28.1 pg (ref 26.0–34.0)
MCHC: 31.8 g/dL — ABNORMAL LOW (ref 32.0–36.0)
MCV: 88.3 fL (ref 80.0–100.0)
Platelets: 234 10*3/uL (ref 150–440)
RBC: 3.89 MIL/uL (ref 3.80–5.20)
RDW: 16 % — AB (ref 11.5–14.5)
WBC: 10 10*3/uL (ref 3.6–11.0)

## 2015-11-16 LAB — BASIC METABOLIC PANEL
Anion gap: 6 (ref 5–15)
BUN: 9 mg/dL (ref 6–20)
CHLORIDE: 109 mmol/L (ref 101–111)
CO2: 27 mmol/L (ref 22–32)
CREATININE: 0.67 mg/dL (ref 0.44–1.00)
Calcium: 8.6 mg/dL — ABNORMAL LOW (ref 8.9–10.3)
GFR calc non Af Amer: >60 mL/min (ref >60)
GLUCOSE: 113 mg/dL — AB (ref 65–99)
Potassium: 3.1 mmol/L — ABNORMAL LOW (ref 3.5–5.1)
Sodium: 142 mmol/L (ref 135–145)

## 2015-11-16 LAB — HEPARIN LEVEL (UNFRACTIONATED)
HEPARIN UNFRACTIONATED: 0.2 [IU]/mL — AB (ref 0.30–0.70)
Heparin Unfractionated: 0.13 IU/mL — ABNORMAL LOW (ref 0.30–0.70)
Heparin Unfractionated: 0.48 IU/mL (ref 0.30–0.70)

## 2015-11-16 LAB — GLUCOSE, CAPILLARY
Glucose-Capillary: 91 mg/dL (ref 65–99)
Glucose-Capillary: 107 mg/dL — ABNORMAL HIGH (ref 65–99)
Glucose-Capillary: 81 mg/dL (ref 65–99)
Glucose-Capillary: 102 mg/dL — ABNORMAL HIGH (ref 65–99)

## 2015-11-16 LAB — MAGNESIUM: MAGNESIUM: 2 mg/dL (ref 1.7–2.4)

## 2015-11-16 MED ORDER — HEPARIN BOLUS VIA INFUSION
1200.0000 [IU] | Freq: Once | INTRAVENOUS | Status: AC
  Administered 2015-11-16: 1200 [IU] via INTRAVENOUS
  Filled 2015-11-16: qty 1200

## 2015-11-16 MED ORDER — POTASSIUM CHLORIDE 10 MEQ/100ML IV SOLN
10.0000 meq | INTRAVENOUS | Status: DC
  Administered 2015-11-16: 10 meq via INTRAVENOUS
  Filled 2015-11-16 (×4): qty 100

## 2015-11-16 MED ORDER — HEPARIN BOLUS VIA INFUSION
2000.0000 [IU] | Freq: Once | INTRAVENOUS | Status: AC
  Administered 2015-11-16: 2000 [IU] via INTRAVENOUS
  Filled 2015-11-16: qty 2000

## 2015-11-16 MED ORDER — RIVAROXABAN 15 MG PO TABS
15.0000 mg | ORAL_TABLET | Freq: Two times a day (BID) | ORAL | Status: DC
  Administered 2015-11-17: 15 mg via ORAL
  Filled 2015-11-16: qty 1

## 2015-11-16 MED ORDER — GUAIFENESIN 100 MG/5ML PO SOLN
10.0000 mL | ORAL | Status: DC | PRN
  Administered 2015-11-17: 200 mg via ORAL
  Filled 2015-11-16 (×2): qty 10

## 2015-11-16 MED ORDER — RIVAROXABAN 20 MG PO TABS: 20.0000 mg | ORAL_TABLET | Freq: Every day | ORAL | Status: DC

## 2015-11-16 MED ORDER — POTASSIUM CHLORIDE CRYS ER 20 MEQ PO TBCR: 40.0000 meq | EXTENDED_RELEASE_TABLET | Freq: Once | ORAL | Status: DC

## 2015-11-16 MED ORDER — BENZONATATE 100 MG PO CAPS: 100.0000 mg | ORAL_CAPSULE | ORAL | Status: DC | PRN

## 2015-11-16 MED ORDER — CETYLPYRIDINIUM CHLORIDE 0.05 % MT LIQD
7.0000 mL | Freq: Two times a day (BID) | OROMUCOSAL | Status: DC
  Administered 2015-11-16 (×2): 7 mL via OROMUCOSAL

## 2015-11-16 MED ORDER — POTASSIUM CHLORIDE 20 MEQ PO PACK
40.0000 meq | PACK | Freq: Once | ORAL | Status: AC
  Administered 2015-11-16: 40 meq via ORAL
  Filled 2015-11-16: qty 2

## 2015-11-16 NOTE — Care Management (Signed)
Patient with recent discharge s/p bariatric surgery presents with calf pain and shortness of breath.  Admitted with acute bilateral submassive pulmonary emboli with right ventricular strain.  Was on Lovenox injections at home.  TPA was not advised.  Patient on heparin drip

## 2015-11-16 NOTE — Progress Notes (Signed)
ANTICOAGULATION CONSULT NOTE - Initial Consult  Pharmacy Consult for Heparin Indication: pulmonary embolus  Allergies  Allergen Reactions  . Codeine Itching  . Hydrocodone Itching    Patient Measurements: Height: 5\' 5"  (165.1 cm) Weight: 246 lb 4.1 oz (111.7 kg) IBW/kg (Calculated) : 57 Heparin Dosing Weight: 65.8 kg  Vital Signs: Temp: 99.2 F (37.3 C) (12/13 1644) Temp Source: Oral (12/13 1644) BP: 107/71 mmHg (12/14 0400) Pulse Rate: 76 (12/14 0400)  Labs:  Recent Labs  11/15/15 1648 11/16/15 0403  HGB 12.1 10.9*  HCT 37.8 34.4*  PLT 288 234  APTT 29  --   LABPROT 15.1*  --   INR 1.17  --   HEPARINUNFRC  --  0.13*  CREATININE 0.74 0.67  TROPONINI 0.14*  --     Estimated Creatinine Clearance: 100.1 mL/min (by C-G formula based on Cr of 0.67).   Medical History: Past Medical History  Diagnosis Date  . Asthma     no issues over 1 yr  . Hypertension   . GERD (gastroesophageal reflux disease)   . History of hiatal hernia   . Difficult intubation   . Diabetes [facility](HCC)      Assessment: 54 yo female here with bilateral PE with right heart strain. Hgb 12.1, Plt 288 APTT and INR ordered - no oral anticoagulant noted PTA  Goal of Therapy:  Heparin level 0.3-0.7 units/ml Monitor platelets by anticoagulation protocol: Yes   Plan:  Will order heparin 4000 units IV x1 then 1100 units/hr heparin drip Will order anti-Xa level in 6h at 0230 on 12/14 Will order CBC for AM - will need to continue to monitor s/sx of bleeding  12/14 0400 heparin level 0.13.  2000 unit bolus and increase rate to 1350 units/hr. Recheck in 6 hours.  Pharmacy will continue to follow.   Aphrodite Harpenau S 11/16/2015,4:43 AM

## 2015-11-16 NOTE — Progress Notes (Signed)
ANTICOAGULATION CONSULT NOTE - Initial Consult  Pharmacy Consult for Heparin Indication: pulmonary embolus  Allergies  Allergen Reactions  . Codeine Itching  . Hydrocodone Itching    Patient Measurements: Height: 5\' 5"  (165.1 cm) Weight: 247 lb 2.2 oz (112.1 kg) IBW/kg (Calculated) : 57 Heparin Dosing Weight: 65.8 kg  Vital Signs: Temp: 98.6 F (37 C) (12/14 2029) Temp Source: Oral (12/14 2029) BP: 122/69 mmHg (12/14 2029) Pulse Rate: 95 (12/14 2029)  Labs:  Recent Labs  11/15/15 1648 11/16/15 0403 11/16/15 1106 11/16/15 2112  HGB 12.1 10.9*  --   --   HCT 37.8 34.4*  --   --   PLT 288 234  --   --   APTT 29  --   --   --   LABPROT 15.1*  --   --   --   INR 1.17  --   --   --   HEPARINUNFRC  --  0.13* 0.20* 0.48  CREATININE 0.74 0.67  --   --   TROPONINI 0.14*  --   --   --     Estimated Creatinine Clearance: 100.3 mL/min (by C-G formula based on Cr of 0.67).   Medical History: Past Medical History  Diagnosis Date  . Asthma     no issues over 1 yr  . Hypertension   . GERD (gastroesophageal reflux disease)   . History of hiatal hernia   . Difficult intubation   . Diabetes [facility](HCC)      Assessment: 54 yo female here with bilateral PE with right heart strain.  HL= 0.2  Goal of Therapy:  Heparin level 0.3-0.7 units/ml Monitor platelets by anticoagulation protocol: Yes   Plan:  Heparin level is below goal so will bolus heparin 1200 units iv once then increase infusion to 1500 units/hr. Will recheck HL 6 hours after rate change.  Pharmacy will continue to follow.   12/14:  HL @ 21:00 = 0.48. Will continue this pt on heparin 1500 units/hr and draw confirmation HL on 12/15 @ 3:00.   Tandre Conly D 11/16/2015,9:57 PM

## 2015-11-16 NOTE — Progress Notes (Signed)
[facility]   PATIENT NAME: Erica Powers    MR#:  [id number]  DATE OF BIRTH:  07/26/1961  SUBJECTIVE:  Came in with bilateral pleuritic cp. Found to have PE Feels better today  REVIEW OF SYSTEMS:   Review of Systems  Constitutional: Negative for fever, chills and weight loss.  HENT: Negative for ear discharge, ear pain and nosebleeds.   Eyes: Negative for blurred vision, pain and discharge.  Respiratory: Positive for shortness of breath. Negative for sputum production, wheezing and stridor.   Cardiovascular: Positive for chest pain. Negative for palpitations, orthopnea and PND.  Gastrointestinal: Negative for nausea, vomiting, abdominal pain and diarrhea.  Genitourinary: Negative for urgency and frequency.  Musculoskeletal: Negative for back pain and joint pain.  Neurological: Negative for sensory change, speech change, focal weakness and weakness.  Psychiatric/Behavioral: Negative for depression and hallucinations. The patient is not nervous/anxious.   All other systems reviewed and are negative.  Tolerating Diet:yes Tolerating PT: pending  DRUG ALLERGIES:   Allergies  Allergen Reactions  . Codeine Itching  . Hydrocodone Itching    VITALS:  Blood pressure 122/46, pulse 83, temperature 98.8 F (37.1 C), temperature source Oral, resp. rate 18, height  (1.651 m), weight 247 lb 2.2 oz (112.1 kg), SpO2 96 %.  PHYSICAL EXAMINATION:   Physical Exam  GENERAL:  54 y.o.-year-old patient lying in the bed with no acute distress. Obese EYES: Pupils equal, round, reactive to light and accommodation. No scleral icterus. Extraocular muscles intact.  HEENT: Head atraumatic, normocephalic. Oropharynx and nasopharynx clear.  NECK:  Supple, no jugular venous distention. No thyroid enlargement, no tenderness.  LUNGS: Normal breath sounds bilaterally, no wheezing, rales, rhonchi. No use of accessory muscles of respiration.   CARDIOVASCULAR: S1, S2 normal. No murmurs, rubs, or gallops.  ABDOMEN: Soft, nontender, nondistended. Bowel sounds present. No organomegaly or mass.  EXTREMITIES: No cyanosis, clubbing or edema b/l.    NEUROLOGIC: Cranial nerves II through XII are intact. No focal Motor or sensory deficits b/l.   PSYCHIATRIC: The patient is alert and oriented x 3.  SKIN: No obvious rash, lesion, or ulcer.    LABORATORY PANEL:   CBC  Recent Labs Lab 11/16/15 0403  WBC 10.0  HGB 10.9*  HCT 34.4*  PLT 234    Chemistries   Recent Labs Lab 11/16/15 0403  NA 142  K 3.1*  CL 109  CO2 27  GLUCOSE 113*  BUN 9  CREATININE 0.67  CALCIUM 8.6*  MG 2.0    Cardiac Enzymes  Recent Labs Lab 11/15/15 1648  TROPONINI 0.14*    RADIOLOGY:  Dg Chest 2 View  11/15/2015  CLINICAL DATA:  Cough and shortness of breath beginning yesterday. Status post gastric bypass 3 days ago. Initial encounter. EXAM: CHEST  2 VIEW COMPARISON:  Single view of the chest 11/04/2015 and 11/03/2015. CT chest, abdomen and pelvis 11/03/2015. FINDINGS: There is airspace disease in the left lower lobe. The right lung is clear. Mild elevation of the right hemidiaphragm relative to the left is noted. There is likely a small left pleural effusion. No pneumothorax. Heart size is normal. IMPRESSION: Left pleural effusion with associated left basilar airspace disease which could be due to atelectasis or pneumonia. Electronically Signed   By: Drusilla Kanner M.D.   On: 11/15/2015 17:32   Ct Angio Chest Pe W/cm &/or Wo Cm  11/15/2015  CLINICAL [facility] gastric bypass surgery 11/01/2015, onset of cough, shortness of breath,  tachycardia and hypoxia, question pulmonary embolism EXAM: CT ANGIOGRAPHY CHEST WITH CONTRAST TECHNIQUE: Multidetector CT imaging of the chest was performed using the standard protocol during bolus administration of intravenous contrast. Multiplanar CT image reconstructions and MIPs were obtained to evaluate the  vascular anatomy. CONTRAST:  80mL OMNIPAQUE IOHEXOL 350 MG/ML SOLN IV COMPARISON:  CT chest 11/03/2015 FINDINGS: Aorta normal caliber without aneurysm or dissection. Pulmonary arteries well opacified. Large filling defects identified within pulmonary arteries bilaterally compatible with pulmonary embolism. These include emboli within proximal LEFT upper lobe, lingula, and BILATERAL lower lobe pulmonary arteries. Additional small embolus into RIGHT upper lobe pulmonary artery. Mild dilatation of the RIGHT ventricle with elevated RV/LV ratio = 1.21 compatible with at least submassive pulmonary embolism. Postsurgical changes of gastric bypass. Gallbladder surgically absent. Remaining visualized upper abdomen unremarkable. No thoracic adenopathy. Significant atelectasis LEFT lower lobe with small LEFT pleural effusion. Linear subsegmental atelectasis RIGHT lower lobe. Remaining lungs clear. No pneumothorax or acute osseous findings. Review of the MIP images confirms the above findings. IMPRESSION: BILATERAL pulmonary emboli. Positive for acute PE with CT evidence of right heart strain (RV/LV Ratio = 1.21) consistent with at least submassive (intermediate risk) PE. The presence of right heart strain has been associated with an increased risk of morbidity and mortality. Critical Value/emergent results were called by telephone at the time of interpretation on 11/15/2015 at 1835 hr to Dr. Gladstone PihAVID SCHAEVITZ , who verbally acknowledged these results. Electronically Signed   By: Ulyses SouthwardMark  Boles M.D.   On: 11/15/2015 18:38   [place] Venous Img Lower Bilateral  11/16/2015  CLINICAL DATA:  History of pulmonary emboli.  Recent surgery. EXAM: BILATERAL LOWER EXTREMITY VENOUS DOPPLER ULTRASOUND TECHNIQUE: Gray-scale sonography with graded compression, as well as color Doppler and duplex ultrasound were performed to evaluate the lower extremity deep venous systems from the level of the common femoral vein and including the common femoral,  femoral, profunda femoral, popliteal and calf veins including the posterior tibial, peroneal and gastrocnemius veins when visible. The superficial great saphenous vein was also interrogated. Spectral Doppler was utilized to evaluate flow at rest and with distal augmentation maneuvers in the common femoral, femoral and popliteal veins. COMPARISON:  CT 11/15/2015. FINDINGS: RIGHT LOWER EXTREMITY Common Femoral Vein: No evidence of thrombus. Normal compressibility, respiratory phasicity and response to augmentation. Saphenofemoral Junction: No evidence of thrombus. Normal compressibility and flow on color Doppler imaging. Profunda Femoral Vein: No evidence of thrombus. Normal compressibility and flow on color Doppler imaging. Femoral Vein: No evidence of thrombus. Normal compressibility, respiratory phasicity and response to augmentation. Popliteal Vein: No evidence of thrombus. Normal compressibility, respiratory phasicity and response to augmentation. Calf Veins: No evidence of thrombus. Normal compressibility and flow on color Doppler imaging. Superficial Great Saphenous Vein: No evidence of thrombus. Normal compressibility and flow on color Doppler imaging. Other Findings:  None. LEFT LOWER EXTREMITY Common Femoral Vein: No evidence of thrombus. Normal compressibility, respiratory phasicity and response to augmentation. Saphenofemoral Junction: No evidence of thrombus. Normal compressibility and flow on color Doppler imaging. Profunda Femoral Vein: No evidence of thrombus. Normal compressibility and flow on color Doppler imaging. Femoral Vein: No evidence of thrombus. Normal compressibility, respiratory phasicity and response to augmentation. Popliteal Vein: Occlusive thrombus in the popliteal vein. Calf Veins: Occlusive thrombus in the calf veins. Superficial Great Saphenous Vein: No evidence of thrombus. Normal compressibility and flow on color Doppler imaging. Other Findings:  None. IMPRESSION: Occlusive DVT  noted the left popliteal vein and calf veins. These results will be  called to the ordering clinician or representative by the Radiologist Assistant, and communication documented in the PACS or zVision Dashboard. Electronically Signed   By: Maisie Fus  Register   On: 11/16/2015 10:45   ASSESSMENT AND PLAN:  Sophronia Varney is a 54 y.o. female with a known history of hypertension, diabetes, morbid obesity with recent bariatric surgery 2 weeks back presents to the emergency room complaining of 2 days of shortness of breath and dry cough. Patient has also had bilateral lower chest pleuritic chest pain.  * Acute bilateral pulmonary emboli with right ventricular strain -likely provoked by recent surgery although pt took lovenox at home post-op for 10 days Normal blood pressure and heart rate. Patient does have acute hypoxic respiratory failure. -Doppler LE showed left popliteal vein DVT. -on IV heparin drip-->will switct to po xarelto treatment dose from tomorrow  * Elevated troponin likely from RV strain. Check echocardiogram.  -trop neg  * Diabetes mellitus Sliding scale insulin and diabetic diet.  * Hypertension Continue home medications  * Recent bariatric surgery. Patient is healing well.  Transfer to medical floor  Case discussed with Care Management/Social Worker. Management plans discussed with the patient, family and they are in agreement.  CODE STATUS: full  DVT Prophylaxis: heparin gtt  TOTAL TIME TAKING CARE OF THIS PATIENT: 35 minutes.  >50% time spent on counselling and coordination of care  POSSIBLE D/C IN 1-2* DAYS, DEPENDING ON CLINICAL CONDITION.   Shemaiah Round M.D on 11/16/2015 at 4:28 PM  Between 7am to 6pm - Pager - [phone]  After 6pm go to [email or web address] - password [email or web address] [facility]  [facility]  Office  [phone]  CC: Primary care physician; Maryruth Hancock, MD

## 2015-11-16 NOTE — Consult Note (Signed)
[facility] [place] Pulmonary Medicine Consultation     Date: 11/16/2015,   MRN# [id number] Erica Powers 05/24/1961 Code Status:     Code Status Orders        Start     Ordered   11/15/15 1919  Full code   Continuous     11/15/15 1919     Hosp day:@LENGTHOFSTAYDAYS @ Referring MD: @     PCP:      AdmissionWeight: 145 lb (65.772 kg)                 CurrentWeight: 246 lb 4.1 oz (111.7 kg) Erica Powers is a 54 y.o. old female seen in consultation for PE at the request of Dr. Allena Katz   NW:GNFAO SOB HPI:54 y.o. female with a known history of hypertension, diabetes, morbid obesity with recent bariatric surgery 2 weeks back presents to the emergency room complaining of 2 days of shortness of breath and dry cough. Patient has also had bilateral lower chest pleuritic chest pain.   Patient was on Lovenox during her 3 day stay for the bariatric surgery. She had some shortness of breath and hypoxic respiratory failure during the hospital stay which was thought to be due to atelectasis and resolved by the time of discharge.  Patient had some pain in her right lower extremity for which she had lower extremity Dopplers on 11/08/2015 which showed no DVT. No history of DVT or PE.  Patient's CTA of the chest showed bilateral PE with RV strain.  Patient alert and awake, no SOB, BP stable. HR stable. No indication for TPA at this time especially in the setting of recent surgery         MEDICATIONS    Home Medication:  No current outpatient prescriptions on file.  Current Medication:   Current facility-administered medications:  .  0.9 %  sodium chloride infusion, 250 mL, Intravenous, PRN, Milagros Loll, MD .  acetaminophen (TYLENOL) tablet 650 mg, 650 mg, Oral, Q6H PRN, 650 mg at 11/15/15 2135 **OR** acetaminophen (TYLENOL) suppository 650 mg, 650 mg, Rectal, Q6H PRN, Srikar Sudini, MD .  albuterol (PROVENTIL) (2.5 MG/3ML) 0.083% nebulizer solution 2.5 mg, 2.5 mg,  Nebulization, Q2H PRN, Srikar Sudini, MD .  bisacodyl (DULCOLAX) EC tablet 5 mg, 5 mg, Oral, Daily PRN, Srikar Sudini, MD .  heparin ADULT infusion 100 units/mL (25000 units/250 mL), 1,350 Units/hr, Intravenous, Continuous, Myrna Blazer, MD, Last Rate: 13.5 mL/hr at 11/16/15 0700, 1,350 Units/hr at 11/16/15 0700 .  hydrochlorothiazide (HYDRODIURIL) tablet 50 mg, 50 mg, Oral, Daily, Srikar Sudini, MD, 50 mg at 11/16/15 1049 .  HYDROcodone-acetaminophen (HYCET) 7.5-325 mg/15 ml solution 15 mL, 15 mL, Oral, QID PRN, Srikar Sudini, MD .  insulin aspart (novoLOG) injection 0-5 Units, 0-5 Units, Subcutaneous, QHS, Srikar Sudini, MD, 0 Units at 11/15/15 2200 .  insulin aspart (novoLOG) injection 0-9 Units, 0-9 Units, Subcutaneous, TID WC, Srikar Sudini, MD, 0 Units at 11/16/15 0800 .  ondansetron (ZOFRAN) tablet 4 mg, 4 mg, Oral, Q6H PRN **OR** ondansetron (ZOFRAN) injection 4 mg, 4 mg, Intravenous, Q6H PRN, Srikar Sudini, MD .  polyethylene glycol (MIRALAX / GLYCOLAX) packet 17 g, 17 g, Oral, Daily PRN, Srikar Sudini, MD .  sodium chloride 0.9 % injection 3 mL, 3 mL, Intravenous, Q12H, Srikar Sudini, MD, 3 mL at 11/15/15 2200 .  sodium chloride 0.9 % injection 3 mL, 3 mL, Intravenous, Q12H, Srikar Sudini, MD, 3 mL at 11/16/15 1000 .  sodium chloride 0.9 % injection 3 mL, 3 mL,  Intravenous, PRN, Milagros Loll, MD .  sodium phosphate (FLEET) 7-19 GM/118ML enema 1 enema, 1 enema, Rectal, Once PRN, Milagros Loll, MD     ALLERGIES   Codeine and Hydrocodone     REVIEW OF SYSTEMS   Review of Systems  Constitutional: Positive for malaise/fatigue. Negative for fever, chills and weight loss.  Eyes: Negative for blurred vision.  Respiratory: Positive for cough and shortness of breath. Negative for hemoptysis, sputum production and wheezing.   Cardiovascular: Negative for chest pain and palpitations.  Gastrointestinal: Negative for nausea, vomiting and abdominal pain.  Skin: Negative for  rash.  Neurological: Negative for dizziness and headaches.  Psychiatric/Behavioral: Negative for depression. The patient is not nervous/anxious.      VS: BP 118/74 mmHg  Pulse 86  Temp(Src) 97.8 F (36.6 C) (Oral)  Resp 26  Ht  (1.651 m)  Wt 246 lb 4.1 oz (111.7 kg)  BMI 40.98 kg/m2  SpO2 97%     PHYSICAL EXAM   Physical Exam  Constitutional: She is oriented to person, place, and time. She appears well-developed and well-nourished. No distress.  HENT:  Head: Normocephalic and atraumatic.  Eyes: Conjunctivae are normal. Pupils are equal, round, and reactive to light. No scleral icterus.  Neck: Normal range of motion. Neck supple.  Cardiovascular: Normal rate and regular rhythm.   No murmur heard. Pulmonary/Chest: Effort normal and breath sounds normal. No respiratory distress.  Abdominal: Soft. Bowel sounds are normal.  Musculoskeletal: Normal range of motion. She exhibits no edema.  Neurological: She is alert and oriented to person, place, and time.  Skin: Skin is warm. She is not diaphoretic.        LABS    Recent Labs     11/15/15  1648  11/16/15  0403  HGB  12.1  10.9*  HCT  37.8  34.4*  MCV  88.3  88.3  WBC  13.7*  10.0  BUN  14  9  CREATININE  0.74  0.67  GLUCOSE  116*  113*  CALCIUM  9.3  8.6*  INR  1.17   --   ,    No results for input(s): PH in the last 72 hours.  Invalid input(s): PCO2, PO2, BASEEXCESS, BASEDEFICITE, TFT    CULTURE RESULTS   Recent Results (from the past 240 hour(s))  MRSA PCR Screening     Status: None   Collection Time: 11/15/15  9:40 PM  Result Value Ref Range Status   MRSA by PCR NEGATIVE NEGATIVE Final    Comment:        The GeneXpert MRSA Assay (FDA approved for NASAL specimens only), is one component of a comprehensive MRSA colonization surveillance program. It is not intended to diagnose MRSA infection nor to guide or monitor treatment for MRSA infections.           IMAGING    Dg Chest 2  View  11/15/2015  CLINICAL DATA:  Cough and shortness of breath beginning yesterday. Status post gastric bypass 3 days ago. Initial encounter. EXAM: CHEST  2 VIEW COMPARISON:  Single view of the chest 11/04/2015 and 11/03/2015. CT chest, abdomen and pelvis 11/03/2015. FINDINGS: There is airspace disease in the left lower lobe. The right lung is clear. Mild elevation of the right hemidiaphragm relative to the left is noted. There is likely a small left pleural effusion. No pneumothorax. Heart size is normal. IMPRESSION: Left pleural effusion with associated left basilar airspace disease which could be due to atelectasis or pneumonia. Electronically  Signed   By: Drusilla Kannerhomas  Dalessio M.D.   On: 11/15/2015 17:32   Ct Angio Chest Pe W/cm &/or Wo Cm  11/15/2015  CLINICAL DATA:  Post gastric bypass surgery 11/01/2015, onset of cough, shortness of breath, tachycardia and hypoxia, question pulmonary embolism EXAM: CT ANGIOGRAPHY CHEST WITH CONTRAST TECHNIQUE: Multidetector CT imaging of the chest was performed using the standard protocol during bolus administration of intravenous contrast. Multiplanar CT image reconstructions and MIPs were obtained to evaluate the vascular anatomy. CONTRAST:  80mL OMNIPAQUE IOHEXOL 350 MG/ML SOLN IV COMPARISON:  CT chest 11/03/2015 FINDINGS: Aorta normal caliber without aneurysm or dissection. Pulmonary arteries well opacified. Large filling defects identified within pulmonary arteries bilaterally compatible with pulmonary embolism. These include emboli within proximal LEFT upper lobe, lingula, and BILATERAL lower lobe pulmonary arteries. Additional small embolus into RIGHT upper lobe pulmonary artery. Mild dilatation of the RIGHT ventricle with elevated RV/LV ratio = 1.21 compatible with at least submassive pulmonary embolism. Postsurgical changes of gastric bypass. Gallbladder surgically absent. Remaining visualized upper abdomen unremarkable. No thoracic adenopathy. Significant  atelectasis LEFT lower lobe with small LEFT pleural effusion. Linear subsegmental atelectasis RIGHT lower lobe. Remaining lungs clear. No pneumothorax or acute osseous findings. Review of the MIP images confirms the above findings. IMPRESSION: BILATERAL pulmonary emboli. Positive for acute PE with CT evidence of right heart strain (RV/LV Ratio = 1.21) consistent with at least submassive (intermediate risk) PE. The presence of right heart strain has been associated with an increased risk of morbidity and mortality. Critical Value/emergent results were called by telephone at the time of interpretation on 11/15/2015 at 1835 hr to Dr. Gladstone PihAVID SCHAEVITZ , who verbally acknowledged these results. Electronically Signed   By: Ulyses SouthwardMark  Boles M.D.   On: 11/15/2015 18:38   [place] Venous Img Lower Bilateral  11/16/2015  CLINICAL DATA:  History of pulmonary emboli.  Recent surgery. EXAM: BILATERAL LOWER EXTREMITY VENOUS DOPPLER ULTRASOUND TECHNIQUE: Gray-scale sonography with graded compression, as well as color Doppler and duplex ultrasound were performed to evaluate the lower extremity deep venous systems from the level of the common femoral vein and including the common femoral, femoral, profunda femoral, popliteal and calf veins including the posterior tibial, peroneal and gastrocnemius veins when visible. The superficial great saphenous vein was also interrogated. Spectral Doppler was utilized to evaluate flow at rest and with distal augmentation maneuvers in the common femoral, femoral and popliteal veins. COMPARISON:  CT 11/15/2015. FINDINGS: RIGHT LOWER EXTREMITY Common Femoral Vein: No evidence of thrombus. Normal compressibility, respiratory phasicity and response to augmentation. Saphenofemoral Junction: No evidence of thrombus. Normal compressibility and flow on color Doppler imaging. Profunda Femoral Vein: No evidence of thrombus. Normal compressibility and flow on color Doppler imaging. Femoral Vein: No evidence of  thrombus. Normal compressibility, respiratory phasicity and response to augmentation. Popliteal Vein: No evidence of thrombus. Normal compressibility, respiratory phasicity and response to augmentation. Calf Veins: No evidence of thrombus. Normal compressibility and flow on color Doppler imaging. Superficial Great Saphenous Vein: No evidence of thrombus. Normal compressibility and flow on color Doppler imaging. Other Findings:  None. LEFT LOWER EXTREMITY Common Femoral Vein: No evidence of thrombus. Normal compressibility, respiratory phasicity and response to augmentation. Saphenofemoral Junction: No evidence of thrombus. Normal compressibility and flow on color Doppler imaging. Profunda Femoral Vein: No evidence of thrombus. Normal compressibility and flow on color Doppler imaging. Femoral Vein: No evidence of thrombus. Normal compressibility, respiratory phasicity and response to augmentation. Popliteal Vein: Occlusive thrombus in the popliteal vein. Calf Veins: Occlusive thrombus  in the calf veins. Superficial Great Saphenous Vein: No evidence of thrombus. Normal compressibility and flow on color Doppler imaging. Other Findings:  None. IMPRESSION: Occlusive DVT noted the left popliteal vein and calf veins. These results will be called to the ordering clinician or representative by the Radiologist Assistant, and communication documented in the PACS or zVision Dashboard. Electronically Signed   By: Maisie Fus  Register   On: 11/16/2015 10:45        ASSESSMENT/PLAN   54 yo obese AAF with acute PE with Left leg DVT(provoked event) per prelim [place] report  1.anticoagulation per primary team 2.oxygen as needed 3.no indication for TPA at this time 4.follow up surgery recs  Ct chest 11/15/15  images reviewed 11/16/2015  DOES NOT MEET ICU/STEP DOWN CRITERIA-RECOMMEND TRANSFER TO GEN MED FLOOR   I have personally obtained a history, examined the patient, evaluated laboratory and independently reviewed imaging  results, formulated the assessment and plan and placed orders.  The Patient requires high complexity decision making for assessment and support, frequent evaluation and titration of therapies, application of advanced monitoring technologies and extensive interpretation of multiple databases.  Patient/Family are satisfied with Plan of action and management. All questions answered   Lucie Leather, M.D.  [facility]  Medical Director [facility] Medical Director [facility] Cardio-Pulmonary Department

## 2015-11-16 NOTE — Progress Notes (Signed)
ANTICOAGULATION CONSULT NOTE - Initial Consult  Pharmacy Consult for Xarelto (Rivaroxaban)  Indication: pulmonary embolus and DVT  Allergies  Allergen Reactions  . Codeine Itching  . Hydrocodone Itching   Patient Measurements: Height: 5\' 5"  (165.1 cm) Weight: 247 lb 2.2 oz (112.1 kg) IBW/kg (Calculated) : 57  Vital Signs: Temp: 97.6 F (36.4 C) (12/14 1400) Temp Source: Oral (12/14 1400) BP: 114/67 mmHg (12/14 1400) Pulse Rate: 94 (12/14 1400)  Recent Labs  11/15/15 1648 11/16/15 0403 11/16/15 1106  HGB 12.1 10.9*  --   HCT 37.8 34.4*  --   PLT 288 234  --   APTT 29  --   --   LABPROT 15.1*  --   --   INR 1.17  --   --   HEPARINUNFRC  --  0.13* 0.20*  CREATININE 0.74 0.67  --   TROPONINI 0.14*  --   --     Estimated Creatinine Clearance: 100.3 mL/min (by C-G formula based on Cr of 0.67).   Medical History: Past Medical History  Diagnosis Date  . Asthma     no issues over 1 yr  . Hypertension   . GERD (gastroesophageal reflux disease)   . History of hiatal hernia   . Difficult intubation   . Diabetes [facility](HCC)    Assessment: 54 yo patient with DVT and PE. Pharmacy consulted for dosing of Rivaroxaban.  Patient currently on heparin drip and will be initiated on rivaroxaban on 12/15 per Dr. Eliane DecreeS. Patel.   Plan:  Will start patient on Rivaroxaban 15mg  BID for 21 days, followed by rivaroxaban 20mg  daily. Medication should be administered with food at the same time each day. Heparin drip should be discontinued right before first rivaroxaban dose is administered on 12/15.    Cher NakaiSheema Nandana Krolikowski, PharmD Pharmacy Resident  11/16/2015,3:26 PM

## 2015-11-16 NOTE — Progress Notes (Signed)
ANTICOAGULATION CONSULT NOTE - Initial Consult  Pharmacy Consult for Heparin Indication: pulmonary embolus  Allergies  Allergen Reactions  . Codeine Itching  . Hydrocodone Itching    Patient Measurements: Height: 5\' 5"  (165.1 cm) Weight: 247 lb 2.2 oz (112.1 kg) IBW/kg (Calculated) : 57 Heparin Dosing Weight: 65.8 kg  Vital Signs: Temp: 97.8 F (36.6 C) (12/14 0800) Temp Source: Oral (12/14 0800) BP: 118/74 mmHg (12/14 1000) Pulse Rate: 86 (12/14 1000)  Labs:  Recent Labs  11/15/15 1648 11/16/15 0403 11/16/15 1106  HGB 12.1 10.9*  --   HCT 37.8 34.4*  --   PLT 288 234  --   APTT 29  --   --   LABPROT 15.1*  --   --   INR 1.17  --   --   HEPARINUNFRC  --  0.13* 0.20*  CREATININE 0.74 0.67  --   TROPONINI 0.14*  --   --     Estimated Creatinine Clearance: 100.3 mL/min (by C-G formula based on Cr of 0.67).   Medical History: Past Medical History  Diagnosis Date  . Asthma     no issues over 1 yr  . Hypertension   . GERD (gastroesophageal reflux disease)   . History of hiatal hernia   . Difficult intubation   . Diabetes [facility](HCC)      Assessment: 54 yo female here with bilateral PE with right heart strain.  HL= 0.2  Goal of Therapy:  Heparin level 0.3-0.7 units/ml Monitor platelets by anticoagulation protocol: Yes   Plan:  Heparin level is below goal so will bolus heparin 1200 units iv once then increase infusion to 1500 units/hr. Will recheck HL 6 hours after rate change.  Pharmacy will continue to follow.   Luisa Harthristy, Seanne Chirico D 11/16/2015,1:30 PM

## 2015-11-16 NOTE — Progress Notes (Signed)
*  PRELIMINARY RESULTS* Echocardiogram 2D Echocardiogram has been performed.  Erica Powers 11/16/2015, 8:50 AM

## 2015-11-17 LAB — CBC
HCT: 35.8 % (ref 35.0–47.0)
Hemoglobin: 11.3 g/dL — ABNORMAL LOW (ref 12.0–16.0)
MCH: 28 pg (ref 26.0–34.0)
MCHC: 31.7 g/dL — AB (ref 32.0–36.0)
MCV: 88.4 fL (ref 80.0–100.0)
PLATELETS: 279 10*3/uL (ref 150–440)
RBC: 4.05 MIL/uL (ref 3.80–5.20)
RDW: 16.1 % — AB (ref 11.5–14.5)
WBC: 11.1 10*3/uL — AB (ref 3.6–11.0)

## 2015-11-17 LAB — GLUCOSE, CAPILLARY
GLUCOSE-CAPILLARY: 102 mg/dL — AB (ref 65–99)
Glucose-Capillary: 107 mg/dL — ABNORMAL HIGH (ref 65–99)

## 2015-11-17 LAB — HEPARIN LEVEL (UNFRACTIONATED): Heparin Unfractionated: 0.65 IU/mL (ref 0.30–0.70)

## 2015-11-17 MED ORDER — RIVAROXABAN 15 MG PO TABS: 15.0000 mg | ORAL_TABLET | Freq: Two times a day (BID) | ORAL | Status: DC

## 2015-11-17 MED ORDER — RIVAROXABAN 20 MG PO TABS: 20.0000 mg | ORAL_TABLET | Freq: Every day | ORAL | Status: DC

## 2015-11-17 MED ORDER — GUAIFENESIN 100 MG/5ML PO SOLN: 10.0000 mL | ORAL | Status: DC | PRN

## 2015-11-17 NOTE — Clinical Documentation Improvement (Signed)
Internal Medicine  Please update your documentation within the medical record to reflect your response to this query.  Thank you      Please provide clinical indicators, historical, or baseline comparative data to support your documented diagnosis of "acute hypoxic respiratory failure" as noted per H&P and progress notes.  Such as: lab values, treatment plan, etc or If the diagnosis is no longer applicable, amend your documentation as appropriate.  Supporting Information: 11/1315 H&P..."LUNGS: Normal breath sounds bilaterally, no wheezing, rales, rhonchi. No use of accessory muscles of respiration. Increased work of breathing." 11/15/15 eLink note.Marland Kitchen.Marland Kitchen."Presents with SOB. Chest CTA reveals submassive PE. BP = 147/81. Sat high 90's on 2.0 L/min [place] O2."... 11/16/15 Progr note.Marland Kitchen.Marland Kitchen."Respiratory: Positive for cough and shortness of breath. Negative for hemoptysis, sputum production and wheezing"..."Pulmonary/Chest: Effort normal and breath sounds normal. No respiratory distress.".Marland Kitchen.Marland Kitchen.  Please exercise your independent, professional judgment when responding. A specific answer is not anticipated or expected.  Thank You, Toribio Harbourphelia R Denya Buckingham, RN, BSN, CCDS Certified Clinical Documentation Specialist [place]: Health Information Management [phone]

## 2015-11-17 NOTE — Discharge Summary (Signed)
[facility] - [place] [facility]   PATIENT NAME: Erica Powers    MR#:  [id number]  DATE OF BIRTH:  Nov 20, 1961  DATE OF ADMISSION:  11/15/2015 ADMITTING PHYSICIAN: Milagros Loll, MD  DATE OF DISCHARGE: 11/17/15  PRIMARY CARE PHYSICIAN: Maryruth Hancock, MD    ADMISSION DIAGNOSIS:  Pulmonary emboli (HCC) [I26.99] Other acute pulmonary embolism with acute cor pulmonale (HCC) [I26.09]  DISCHARGE DIAGNOSIS:  Acute pulmonary embolism with right heart strain Left popliteal vein DVT Status post gastric bypass surgery recent  SECONDARY DIAGNOSIS:   Past Medical History  Diagnosis Date  . Asthma     no issues over 1 yr  . Hypertension   . GERD (gastroesophageal reflux disease)   . History of hiatal hernia   . Difficult intubation   . Diabetes [facility])     HOSPITAL COURSE:   Erica Powers is a 54 y.o. female with a known history of hypertension, diabetes, morbid obesity with recent bariatric surgery 2 weeks back presents to the emergency room complaining of 2 days of shortness of breath and dry cough. Patient has also had bilateral lower chest pleuritic chest pain.  * Acute bilateral pulmonary emboli with right ventricular strain -likely provoked by recent surgery although pt took lovenox at home post-op for 10 days Normal blood pressure and heart rate.  -Doppler LE showed left popliteal vein DVT. -Patient was started on IV heparin drip-->switcted to po xarelto treatment dose from today. She is explained the way to take Xarelto. She'll follow up with primary care physician [facility]. [facility]ena [facility] Room air sats are 94%. She initially was transiently hypoxic but recovered very quickly Does not need home oxygen  * Elevated troponin likely from RV strain. Echocardiogram EF 60%. Overall echo normal.  -trop neg  * Diabetes mellitus Sliding scale insulin and diabetic diet.  * Hypertension Continue home medications  * Recent bariatric surgery.  Patient is healing well. Overall patient seems to be improving. She's hemodynamically stable. She will be discharged to home. CONSULTS OBTAINED:  Treatment Team:  Shane Crutch, MD  DRUG ALLERGIES:   Allergies  Allergen Reactions  . Codeine Itching  . Hydrocodone Itching    DISCHARGE MEDICATIONS:   Current Discharge Medication List    START taking these medications   Details  guaiFENesin (ROBITUSSIN) 100 MG/5ML SOLN Take 10 mLs (200 mg total) by mouth every 4 (four) hours as needed for cough or to loosen phlegm. Qty: 1200 mL, Refills: 0    !! Rivaroxaban (XARELTO) 15 MG TABS tablet Take 1 tablet (15 mg total) by mouth 2 (two) times daily. Qty: 42 tablet, Refills: 0    !! rivaroxaban (XARELTO) 20 MG TABS tablet Take 1 tablet (20 mg total) by mouth daily. Qty: 30 tablet, Refills: 3     !! - Potential duplicate medications found. Please discuss with provider.    CONTINUE these medications which have NOT CHANGED   Details  hydrochlorothiazide (HYDRODIURIL) 50 MG tablet Take 50 mg by mouth daily.    HYDROcodone-acetaminophen (HYCET) 7.5-325 mg/15 ml solution Take 15 mLs by mouth 4 (four) times daily as needed for moderate pain. Qty: 120 mL, Refills: 0    ondansetron (ZOFRAN ODT) 4 MG disintegrating tablet Take 1 tablet (4 mg total) by mouth every 8 (eight) hours as needed for nausea or vomiting. Qty: 20 tablet, Refills: 0      STOP taking these medications     enoxaparin (LOVENOX) 30 MG/0.3ML injection  enoxaparin (LOVENOX) 30 MG/0.3ML injection         If you experience worsening of your admission symptoms, develop shortness of breath, life threatening emergency, suicidal or homicidal thoughts you must seek medical attention immediately by calling 911 or calling your MD immediately  if symptoms less severe.  You Must read complete instructions/literature along with all the possible adverse reactions/side effects for all the Medicines you take and that have  been prescribed to you. Take any new Medicines after you have completely understood and accept all the possible adverse reactions/side effects.   Please note  You were cared for by a hospitalist during your hospital stay. If you have any questions about your discharge medications or the care you received while you were in the hospital after you are discharged, you can call the unit and asked to speak with the hospitalist on call if the hospitalist that took care of you is not available. Once you are discharged, your primary care physician will handle any further medical issues. Please note that NO REFILLS for any discharge medications will be authorized once you are discharged, as it is imperative that you return to your primary care physician (or establish a relationship with a primary care physician if you do not have one) for your aftercare needs so that they can reassess your need for medications and monitor your lab values. Today   SUBJECTIVE   I am doing well. Has some dry cough.  VITAL SIGNS:  Blood pressure 121/65, pulse 93, temperature 98.9 F (37.2 C), temperature source Oral, resp. rate 17, height  (1.651 m), weight 247 lb 2.2 oz (112.1 kg), SpO2 100 %.  I/O:   Intake/Output Summary (Last 24 hours) at 11/17/15 1418 Last data filed at 11/17/15 1030  Gross per 24 hour  Intake    551 ml  Output      0 ml  Net    551 ml    PHYSICAL EXAMINATION:  GENERAL:  54 y.o.-year-old patient lying in the bed with no acute distress. Morbid obesity EYES: Pupils equal, round, reactive to light and accommodation. No scleral icterus. Extraocular muscles intact.  HEENT: Head atraumatic, normocephalic. Oropharynx and nasopharynx clear.  NECK:  Supple, no jugular venous distention. No thyroid enlargement, no tenderness.  LUNGS: Normal breath sounds bilaterally, no wheezing, rales,rhonchi or crepitation. No use of accessory muscles of respiration.  CARDIOVASCULAR: S1, S2 normal. No murmurs,  rubs, or gallops.  ABDOMEN: Soft, non-tender, non-distended. Bowel sounds present. No organomegaly or mass.  EXTREMITIES: No pedal edema, cyanosis, or clubbing.  NEUROLOGIC: Cranial nerves II through XII are intact. Muscle strength 5/5 in all extremities. Sensation intact. Gait not checked.  PSYCHIATRIC: The patient is alert and oriented x 3.  SKIN: No obvious rash, lesion, or ulcer.   DATA REVIEW:   CBC   Recent Labs Lab 11/17/15 0352  WBC 11.1*  HGB 11.3*  HCT 35.8  PLT 279    Chemistries   Recent Labs Lab 11/16/15 0403  NA 142  K 3.1*  CL 109  CO2 27  GLUCOSE 113*  BUN 9  CREATININE 0.67  CALCIUM 8.6*  MG 2.0    Microbiology Results   Recent Results (from the past 240 hour(s))  MRSA PCR Screening     Status: None   Collection Time: 11/15/15  9:40 PM  Result Value Ref Range Status   MRSA by PCR NEGATIVE NEGATIVE Final    Comment:        The GeneXpert MRSA  Assay (FDA approved for NASAL specimens only), is one component of a comprehensive MRSA colonization surveillance program. It is not intended to diagnose MRSA infection nor to guide or monitor treatment for MRSA infections.     RADIOLOGY:  Dg Chest 2 View  11/15/2015  CLINICAL DATA:  Cough and shortness of breath beginning yesterday. Status post gastric bypass 3 days ago. Initial encounter. EXAM: CHEST  2 VIEW COMPARISON:  Single view of the chest 11/04/2015 and 11/03/2015. CT chest, abdomen and pelvis 11/03/2015. FINDINGS: There is airspace disease in the left lower lobe. The right lung is clear. Mild elevation of the right hemidiaphragm relative to the left is noted. There is likely a small left pleural effusion. No pneumothorax. Heart size is normal. IMPRESSION: Left pleural effusion with associated left basilar airspace disease which could be due to atelectasis or pneumonia. Electronically Signed   By: Drusilla Kanner M.D.   On: 11/15/2015 17:32   Ct Angio Chest Pe W/cm &/or Wo Cm  11/15/2015   CLINICAL DATA:  Post gastric bypass surgery 11/01/2015, onset of cough, shortness of breath, tachycardia and hypoxia, question pulmonary embolism EXAM: CT ANGIOGRAPHY CHEST WITH CONTRAST TECHNIQUE: Multidetector CT imaging of the chest was performed using the standard protocol during bolus administration of intravenous contrast. Multiplanar CT image reconstructions and MIPs were obtained to evaluate the vascular anatomy. CONTRAST:  80mL OMNIPAQUE IOHEXOL 350 MG/ML SOLN IV COMPARISON:  CT chest 11/03/2015 FINDINGS: Aorta normal caliber without aneurysm or dissection. Pulmonary arteries well opacified. Large filling defects identified within pulmonary arteries bilaterally compatible with pulmonary embolism. These include emboli within proximal LEFT upper lobe, lingula, and BILATERAL lower lobe pulmonary arteries. Additional small embolus into RIGHT upper lobe pulmonary artery. Mild dilatation of the RIGHT ventricle with elevated RV/LV ratio = 1.21 compatible with at least submassive pulmonary embolism. Postsurgical changes of gastric bypass. Gallbladder surgically absent. Remaining visualized upper abdomen unremarkable. No thoracic adenopathy. Significant atelectasis LEFT lower lobe with small LEFT pleural effusion. Linear subsegmental atelectasis RIGHT lower lobe. Remaining lungs clear. No pneumothorax or acute osseous findings. Review of the MIP images confirms the above findings. IMPRESSION: BILATERAL pulmonary emboli. Positive for acute PE with CT evidence of right heart strain (RV/LV Ratio = 1.21) consistent with at least submassive (intermediate risk) PE. The presence of right heart strain has been associated with an increased risk of morbidity and mortality. Critical Value/emergent results were called by telephone at the time of interpretation on 11/15/2015 at 1835 hr to Dr. Gladstone Pih , who verbally acknowledged these results. Electronically Signed   By: Ulyses Southward M.D.   On: 11/15/2015 18:38   US  Venous Img Lower Bilateral  11/16/2015  CLINICAL DATA:  History of pulmonary emboli.  Recent surgery. EXAM: BILATERAL LOWER EXTREMITY VENOUS DOPPLER ULTRASOUND TECHNIQUE: Gray-scale sonography with graded compression, as well as color Doppler and duplex ultrasound were performed to evaluate the lower extremity deep venous systems from the level of the common femoral vein and including the common femoral, femoral, profunda femoral, popliteal and calf veins including the posterior tibial, peroneal and gastrocnemius veins when visible. The superficial great saphenous vein was also interrogated. Spectral Doppler was utilized to evaluate flow at rest and with distal augmentation maneuvers in the common femoral, femoral and popliteal veins. COMPARISON:  CT 11/15/2015. FINDINGS: RIGHT LOWER EXTREMITY Common Femoral Vein: No evidence of thrombus. Normal compressibility, respiratory phasicity and response to augmentation. Saphenofemoral Junction: No evidence of thrombus. Normal compressibility and flow on color Doppler imaging. Profunda Femoral Vein:  No evidence of thrombus. Normal compressibility and flow on color Doppler imaging. Femoral Vein: No evidence of thrombus. Normal compressibility, respiratory phasicity and response to augmentation. Popliteal Vein: No evidence of thrombus. Normal compressibility, respiratory phasicity and response to augmentation. Calf Veins: No evidence of thrombus. Normal compressibility and flow on color Doppler imaging. Superficial Great Saphenous Vein: No evidence of thrombus. Normal compressibility and flow on color Doppler imaging. Other Findings:  None. LEFT LOWER EXTREMITY Common Femoral Vein: No evidence of thrombus. Normal compressibility, respiratory phasicity and response to augmentation. Saphenofemoral Junction: No evidence of thrombus. Normal compressibility and flow on color Doppler imaging. Profunda Femoral Vein: No evidence of thrombus. Normal compressibility and flow on color  Doppler imaging. Femoral Vein: No evidence of thrombus. Normal compressibility, respiratory phasicity and response to augmentation. Popliteal Vein: Occlusive thrombus in the popliteal vein. Calf Veins: Occlusive thrombus in the calf veins. Superficial Great Saphenous Vein: No evidence of thrombus. Normal compressibility and flow on color Doppler imaging. Other Findings:  None. IMPRESSION: Occlusive DVT noted the left popliteal vein and calf veins. These results will be called to the ordering clinician or representative by the Radiologist Assistant, and communication documented in the PACS or zVision Dashboard. Electronically Signed   By: Maisie Fushomas  Register   On: 11/16/2015 10:45     Management plans discussed with the patient, family and they are in agreement.  CODE STATUS:     Code Status Orders        Start     Ordered   11/15/15 1919  Full code   Continuous     11/15/15 1919      TOTAL TIME TAKING CARE OF THIS PATIENT: 40 minutes.    Colletta Spillers M.D on 11/17/2015 at 2:18 PM  Between 7am to 6pm - Pager - [phone] After 6pm go to [email or web address] - password [email or web address]  [facility]  Office  [phone]  CC: Primary care physician; Maryruth HancockSALLIE Issak Goley, MD

## 2015-11-17 NOTE — Progress Notes (Signed)
Pt stable. Family present. D/c instructions given and education provided. IV removed. Tele removed. Pt getting dressed and will be escorted out by staff.

## 2015-11-17 NOTE — Progress Notes (Signed)
ANTICOAGULATION CONSULT NOTE - Initial Consult  Pharmacy Consult for Heparin Indication: pulmonary embolus  Allergies  Allergen Reactions  . Codeine Itching  . Hydrocodone Itching    Patient Measurements: Height: 5\' 5"  (165.1 cm) Weight: 247 lb 2.2 oz (112.1 kg) IBW/kg (Calculated) : 57 Heparin Dosing Weight: 65.8 kg  Vital Signs: Temp: 98.6 F (37 C) (12/14 2029) Temp Source: Oral (12/14 2029) BP: 122/69 mmHg (12/14 2029) Pulse Rate: 95 (12/14 2029)  Labs:  Recent Labs  11/15/15 1648  11/16/15 0403 11/16/15 1106 11/16/15 2112 11/17/15 0352  HGB 12.1  --  10.9*  --   --   --   HCT 37.8  --  34.4*  --   --   --   PLT 288  --  234  --   --   --   APTT 29  --   --   --   --   --   LABPROT 15.1*  --   --   --   --   --   INR 1.17  --   --   --   --   --   HEPARINUNFRC  --   < > 0.13* 0.20* 0.48 0.65  CREATININE 0.74  --  0.67  --   --   --   TROPONINI 0.14*  --   --   --   --   --   < > = values in this interval not displayed.  Estimated Creatinine Clearance: 100.3 mL/min (by C-G formula based on Cr of 0.67).   Medical History: Past Medical History  Diagnosis Date  . Asthma     no issues over 1 yr  . Hypertension   . GERD (gastroesophageal reflux disease)   . History of hiatal hernia   . Difficult intubation   . Diabetes [facility](HCC)      Assessment: 54 yo female here with bilateral PE with right heart strain.  HL= 0.2  Goal of Therapy:  Heparin level 0.3-0.7 units/ml Monitor platelets by anticoagulation protocol: Yes   Plan:  Heparin level is below goal so will bolus heparin 1200 units iv once then increase infusion to 1500 units/hr. Will recheck HL 6 hours after rate change.  Pharmacy will continue to follow.   12/14:  HL @ 21:00 = 0.48. Will continue this pt on heparin 1500 units/hr and draw confirmation HL on 12/15 @ 3:00.   12/15 0300 heparin level 0.65. Continue current regimen. No f/u labs ordered as Xarelto is ordered to start today and heparin  drip is to stop.  Nayquan Evinger S 11/17/2015,4:17 AM

## 2016-03-04 ENCOUNTER — Encounter: Payer: Self-pay | Admitting: Emergency Medicine

## 2016-03-04 ENCOUNTER — Emergency Department
Admission: EM | Admit: 2016-03-04 | Discharge: 2016-03-04 | Disposition: A | Discharge status: Home / Self Care | Payer: BLUE CROSS/BLUE SHIELD | Attending: Student | Admitting: Student

## 2016-03-04 DIAGNOSIS — Y999 Unspecified external cause status: Secondary | ICD-10-CM | POA: Diagnosis not present

## 2016-03-04 DIAGNOSIS — I10 Essential (primary) hypertension: Secondary | ICD-10-CM | POA: Diagnosis not present

## 2016-03-04 DIAGNOSIS — S39012A Strain of muscle, fascia and tendon of lower back, initial encounter: Secondary | ICD-10-CM

## 2016-03-04 DIAGNOSIS — X58XXXA Exposure to other specified factors, initial encounter: Secondary | ICD-10-CM | POA: Insufficient documentation

## 2016-03-04 DIAGNOSIS — J45909 Unspecified asthma, uncomplicated: Secondary | ICD-10-CM | POA: Diagnosis not present

## 2016-03-04 DIAGNOSIS — Y929 Unspecified place or not applicable: Secondary | ICD-10-CM | POA: Diagnosis not present

## 2016-03-04 DIAGNOSIS — Z79899 Other long term (current) drug therapy: Secondary | ICD-10-CM | POA: Insufficient documentation

## 2016-03-04 DIAGNOSIS — M62830 Muscle spasm of back: Secondary | ICD-10-CM

## 2016-03-04 DIAGNOSIS — Y939 Activity, unspecified: Secondary | ICD-10-CM | POA: Diagnosis not present

## 2016-03-04 DIAGNOSIS — E119 Type 2 diabetes mellitus without complications: Secondary | ICD-10-CM | POA: Insufficient documentation

## 2016-03-04 DIAGNOSIS — M545 Low back pain: Secondary | ICD-10-CM | POA: Diagnosis present

## 2016-03-04 MED ORDER — CYCLOBENZAPRINE HCL 5 MG PO TABS: 5.0000 mg | ORAL_TABLET | Freq: Three times a day (TID) | ORAL | Status: DC | PRN

## 2016-03-04 NOTE — Discharge Instructions (Signed)
Heat Therapy Heat therapy can help ease sore, stiff, injured, and tight muscles and joints. Heat relaxes your muscles, which may help ease your pain.  RISKS AND COMPLICATIONS If you have any of the following conditions, do not use heat therapy unless your health care provider has approved:  Poor circulation.  Healing wounds or scarred skin in the area being treated.  Diabetes, heart disease, or high blood pressure.  Not being able to feel (numbness) the area being treated.  Unusual swelling of the area being treated.  Active infections.  Blood clots.  Cancer.  Inability to communicate pain. This may include young children and people who have problems with their brain function (dementia).  Pregnancy. Heat therapy should only be used on old, pre-existing, or long-lasting (chronic) injuries. Do not use heat therapy on new injuries unless directed by your health care provider. HOW TO USE HEAT THERAPY There are several different kinds of heat therapy, including:  Moist heat pack.  Warm water bath.  Hot water bottle.  Electric heating pad.  Heated gel pack.  Heated wrap.  Electric heating pad. Use the heat therapy method suggested by your health care provider. Follow your health care provider's instructions on when and how to use heat therapy. GENERAL HEAT THERAPY RECOMMENDATIONS  Do not sleep while using heat therapy. Only use heat therapy while you are awake.  Your skin may turn pink while using heat therapy. Do not use heat therapy if your skin turns red.  Do not use heat therapy if you have new pain.  High heat or long exposure to heat can cause burns. Be careful when using heat therapy to avoid burning your skin.  Do not use heat therapy on areas of your skin that are already irritated, such as with a rash or sunburn. SEEK MEDICAL CARE IF:  You have blisters, redness, swelling, or numbness.  You have new pain.  Your pain is worse. MAKE SURE  YOU:  Understand these instructions.  Will watch your condition.  Will get help right away if you are not doing well or get worse.   This information is not intended to replace advice given to you by your health care provider. Make sure you discuss any questions you have with your health care provider.   Document Released: 02/11/2012 Document Revised: 12/10/2014 Document Reviewed: 01/12/2014 Elsevier Interactive Patient Education 2016 [employer].  Low Back Strain With Rehab A strain is an injury in which a tendon or muscle is torn. The muscles and tendons of the lower back are vulnerable to strains. However, these muscles and tendons are very strong and require a great force to be injured. Strains are classified into three categories. Grade 1 strains cause pain, but the tendon is not lengthened. Grade 2 strains include a lengthened ligament, due to the ligament being stretched or partially ruptured. With grade 2 strains there is still function, although the function may be decreased. Grade 3 strains involve a complete tear of the tendon or muscle, and function is usually impaired. SYMPTOMS   Pain in the lower back.  Pain that affects one side more than the other.  Pain that gets worse with movement and may be felt in the hip, buttocks, or back of the thigh.  Muscle spasms of the muscles in the back.  Swelling along the muscles of the back.  Loss of strength of the back muscles.  Crackling sound (crepitation) when the muscles are touched. CAUSES  Lower back strains occur when a force  is placed on the muscles or tendons that is greater than they can handle. Common causes of injury include:  Prolonged overuse of the muscle-tendon units in the lower back, usually from incorrect posture.  A single violent injury or force applied to the back. RISK INCREASES WITH:  Sports that involve twisting forces on the spine or a lot of bending at the waist (football, rugby, weightlifting,  bowling, golf, tennis, speed skating, racquetball, swimming, running, gymnastics, diving).  Poor strength and flexibility.  Failure to warm up properly before activity.  Family history of lower back pain or disk disorders.  Previous back injury or surgery (especially fusion).  Poor posture with lifting, especially heavy objects.  Prolonged sitting, especially with poor posture. PREVENTION   Learn and use proper posture when sitting or lifting (maintain proper posture when sitting, lift using the knees and legs, not at the waist).  Warm up and stretch properly before activity.  Allow for adequate recovery between workouts.  Maintain physical fitness:  Strength, flexibility, and endurance.  Cardiovascular fitness. PROGNOSIS  If treated properly, lower back strains usually heal within 6 weeks. RELATED COMPLICATIONS   Recurring symptoms, resulting in a chronic problem.  Chronic inflammation, scarring, and partial muscle-tendon tear.  Delayed healing or resolution of symptoms.  Prolonged disability. TREATMENT  Treatment first involves the use of ice and medicine, to reduce pain and inflammation. The use of strengthening and stretching exercises may help reduce pain with activity. These exercises may be performed at home or with a therapist. Severe injuries may require referral to a therapist for further evaluation and treatment, such as ultrasound. Your caregiver may advise that you wear a back brace or corset, to help reduce pain and discomfort. Often, prolonged bed rest results in greater harm then benefit. Corticosteroid injections may be recommended. However, these should be reserved for the most serious cases. It is important to avoid using your back when lifting objects. At night, sleep on your back on a firm mattress with a pillow placed under your knees. If non-surgical treatment is unsuccessful, surgery may be needed.  MEDICATION   If pain medicine is needed, nonsteroidal  anti-inflammatory medicines (aspirin and ibuprofen), or other minor pain relievers (acetaminophen), are often advised.  Do not take pain medicine for 7 days before surgery.  Prescription pain relievers may be given, if your caregiver thinks they are needed. Use only as directed and only as much as you need.  Ointments applied to the skin may be helpful.  Corticosteroid injections may be given by your caregiver. These injections should be reserved for the most serious cases, because they may only be given a certain number of times. HEAT AND COLD  Cold treatment (icing) should be applied for 10 to 15 minutes every 2 to 3 hours for inflammation and pain, and immediately after activity that aggravates your symptoms. Use ice packs or an ice massage.  Heat treatment may be used before performing stretching and strengthening activities prescribed by your caregiver, physical therapist, or athletic trainer. Use a heat pack or a warm water soak. SEEK MEDICAL CARE IF:   Symptoms get worse or do not improve in 2 to 4 weeks, despite treatment.  You develop numbness, weakness, or loss of bowel or bladder function.  New, unexplained symptoms develop. (Drugs used in treatment may produce side effects.) EXERCISES  RANGE OF MOTION (ROM) AND STRETCHING EXERCISES - Low Back Strain Most people with lower back pain will find that their symptoms get worse with excessive bending  forward (flexion) or arching at the lower back (extension). The exercises which will help resolve your symptoms will focus on the opposite motion.  Your physician, physical therapist or athletic trainer will help you determine which exercises will be most helpful to resolve your lower back pain. Do not complete any exercises without first consulting with your caregiver. Discontinue any exercises which make your symptoms worse until you speak to your caregiver.  If you have pain, numbness or tingling which travels down into your buttocks,  leg or foot, the goal of the therapy is for these symptoms to move closer to your back and eventually resolve. Sometimes, these leg symptoms will get better, but your lower back pain may worsen. This is typically an indication of progress in your rehabilitation. Be very alert to any changes in your symptoms and the activities in which you participated in the 24 hours prior to the change. Sharing this information with your caregiver will allow him/her to most efficiently treat your condition.  These exercises may help you when beginning to rehabilitate your injury. Your symptoms may resolve with or without further involvement from your physician, physical therapist or athletic trainer. While completing these exercises, remember:  Restoring tissue flexibility helps normal motion to return to the joints. This allows healthier, less painful movement and activity.  An effective stretch should be held for at least 30 seconds.  A stretch should never be painful. You should only feel a gentle lengthening or release in the stretched tissue. FLEXION RANGE OF MOTION AND STRETCHING EXERCISES: STRETCH - Flexion, Single Knee to Chest   Lie on a firm bed or floor with both legs extended in front of you.  Keeping one leg in contact with the floor, bring your opposite knee to your chest. Hold your leg in place by either grabbing behind your thigh or at your knee.  Pull until you feel a gentle stretch in your lower back. Hold __________ seconds.  Slowly release your grasp and repeat the exercise with the opposite side. Repeat __________ times. Complete this exercise __________ times per day.  STRETCH - Flexion, Double Knee to Chest   Lie on a firm bed or floor with both legs extended in front of you.  Keeping one leg in contact with the floor, bring your opposite knee to your chest.  Tense your stomach muscles to support your back and then lift your other knee to your chest. Hold your legs in place by either  grabbing behind your thighs or at your knees.  Pull both knees toward your chest until you feel a gentle stretch in your lower back. Hold __________ seconds.  Tense your stomach muscles and slowly return one leg at a time to the floor. Repeat __________ times. Complete this exercise __________ times per day.  STRETCH - Low Trunk Rotation  Lie on a firm bed or floor. Keeping your legs in front of you, bend your knees so they are both pointed toward the ceiling and your feet are flat on the floor.  Extend your arms out to the side. This will stabilize your upper body by keeping your shoulders in contact with the floor.  Gently and slowly drop both knees together to one side until you feel a gentle stretch in your lower back. Hold for __________ seconds.  Tense your stomach muscles to support your lower back as you bring your knees back to the starting position. Repeat the exercise to the other side. Repeat __________ times. Complete this exercise __________  times per day  EXTENSION RANGE OF MOTION AND FLEXIBILITY EXERCISES: STRETCH - Extension, Prone on Elbows   Lie on your stomach on the floor, a bed will be too soft. Place your palms about shoulder width apart and at the height of your head.  Place your elbows under your shoulders. If this is too painful, stack pillows under your chest.  Allow your body to relax so that your hips drop lower and make contact more completely with the floor.  Hold this position for __________ seconds.  Slowly return to lying flat on the floor. Repeat __________ times. Complete this exercise __________ times per day.  RANGE OF MOTION - Extension, Prone Press Ups  Lie on your stomach on the floor, a bed will be too soft. Place your palms about shoulder width apart and at the height of your head.  Keeping your back as relaxed as possible, slowly straighten your elbows while keeping your hips on the floor. You may adjust the placement of your hands to  maximize your comfort. As you gain motion, your hands will come more underneath your shoulders.  Hold this position __________ seconds.  Slowly return to lying flat on the floor. Repeat __________ times. Complete this exercise __________ times per day.  RANGE OF MOTION- Quadruped, Neutral Spine   Assume a hands and knees position on a firm surface. Keep your hands under your shoulders and your knees under your hips. You may place padding under your knees for comfort.  Drop your head and point your tail bone toward the ground below you. This will round out your lower back like an angry cat. Hold this position for __________ seconds.  Slowly lift your head and release your tail bone so that your back sags into a large arch, like an old horse.  Hold this position for __________ seconds.  Repeat this until you feel limber in your lower back.  Now, find your "sweet spot." This will be the most comfortable position somewhere between the two previous positions. This is your neutral spine. Once you have found this position, tense your stomach muscles to support your lower back.  Hold this position for __________ seconds. Repeat __________ times. Complete this exercise __________ times per day.  STRENGTHENING EXERCISES - Low Back Strain These exercises may help you when beginning to rehabilitate your injury. These exercises should be done near your "sweet spot." This is the neutral, low-back arch, somewhere between fully rounded and fully arched, that is your least painful position. When performed in this safe range of motion, these exercises can be used for people who have either a flexion or extension based injury. These exercises may resolve your symptoms with or without further involvement from your physician, physical therapist or athletic trainer. While completing these exercises, remember:   Muscles can gain both the endurance and the strength needed for everyday activities through controlled  exercises.  Complete these exercises as instructed by your physician, physical therapist or athletic trainer. Increase the resistance and repetitions only as guided.  You may experience muscle soreness or fatigue, but the pain or discomfort you are trying to eliminate should never worsen during these exercises. If this pain does worsen, stop and make certain you are following the directions exactly. If the pain is still present after adjustments, discontinue the exercise until you can discuss the trouble with your caregiver. STRENGTHENING - Deep Abdominals, Pelvic Tilt  Lie on a firm bed or floor. Keeping your legs in front of you, bend your  knees so they are both pointed toward the ceiling and your feet are flat on the floor.  Tense your lower abdominal muscles to press your lower back into the floor. This motion will rotate your pelvis so that your tail bone is scooping upwards rather than pointing at your feet or into the floor.  With a gentle tension and even breathing, hold this position for __________ seconds. Repeat __________ times. Complete this exercise __________ times per day.  STRENGTHENING - Abdominals, Crunches   Lie on a firm bed or floor. Keeping your legs in front of you, bend your knees so they are both pointed toward the ceiling and your feet are flat on the floor. Cross your arms over your chest.  Slightly tip your chin down without bending your neck.  Tense your abdominals and slowly lift your trunk high enough to just clear your shoulder blades. Lifting higher can put excessive stress on the lower back and does not further strengthen your abdominal muscles.  Control your return to the starting position. Repeat __________ times. Complete this exercise __________ times per day.  STRENGTHENING - Quadruped, Opposite UE/LE Lift   Assume a hands and knees position on a firm surface. Keep your hands under your shoulders and your knees under your hips. You may place padding  under your knees for comfort.  Find your neutral spine and gently tense your abdominal muscles so that you can maintain this position. Your shoulders and hips should form a rectangle that is parallel with the floor and is not twisted.  Keeping your trunk steady, lift your right hand no higher than your shoulder and then your left leg no higher than your hip. Make sure you are not holding your breath. Hold this position __________ seconds.  Continuing to keep your abdominal muscles tense and your back steady, slowly return to your starting position. Repeat with the opposite arm and leg. Repeat __________ times. Complete this exercise __________ times per day.  STRENGTHENING - Lower Abdominals, Double Knee Lift  Lie on a firm bed or floor. Keeping your legs in front of you, bend your knees so they are both pointed toward the ceiling and your feet are flat on the floor.  Tense your abdominal muscles to brace your lower back and slowly lift both of your knees until they come over your hips. Be certain not to hold your breath.  Hold __________ seconds. Using your abdominal muscles, return to the starting position in a slow and controlled manner. Repeat __________ times. Complete this exercise __________ times per day.  POSTURE AND BODY MECHANICS CONSIDERATIONS - Low Back Strain Keeping correct posture when sitting, standing or completing your activities will reduce the stress put on different body tissues, allowing injured tissues a chance to heal and limiting painful experiences. The following are general guidelines for improved posture. Your physician or physical therapist will provide you with any instructions specific to your needs. While reading these guidelines, remember:  The exercises prescribed by your provider will help you have the flexibility and strength to maintain correct postures.  The correct posture provides the best environment for your joints to work. All of your joints have less  wear and tear when properly supported by a spine with good posture. This means you will experience a healthier, less painful body.  Correct posture must be practiced with all of your activities, especially prolonged sitting and standing. Correct posture is as important when doing repetitive low-stress activities (typing) as it is when doing a  single heavy-load activity (lifting). RESTING POSITIONS Consider which positions are most painful for you when choosing a resting position. If you have pain with flexion-based activities (sitting, bending, stooping, squatting), choose a position that allows you to rest in a less flexed posture. You would want to avoid curling into a fetal position on your side. If your pain worsens with extension-based activities (prolonged standing, working overhead), avoid resting in an extended position such as sleeping on your stomach. Most people will find more comfort when they rest with their spine in a more neutral position, neither too rounded nor too arched. Lying on a non-sagging bed on your side with a pillow between your knees, or on your back with a pillow under your knees will often provide some relief. Keep in mind, being in any one position for a prolonged period of time, no matter how correct your posture, can still lead to stiffness. PROPER SITTING POSTURE In order to minimize stress and discomfort on your spine, you must sit with correct posture. Sitting with good posture should be effortless for a healthy body. Returning to good posture is a gradual process. Many people can work toward this most comfortably by using various supports until they have the flexibility and strength to maintain this posture on their own. When sitting with proper posture, your ears will fall over your shoulders and your shoulders will fall over your hips. You should use the back of the chair to support your upper back. Your lower back will be in a neutral position, just slightly arched. You  may place a small pillow or folded towel at the base of your lower back for support.  When working at a desk, create an environment that supports good, upright posture. Without extra support, muscles tire, which leads to excessive strain on joints and other tissues. Keep these recommendations in mind: CHAIR:  A chair should be able to slide under your desk when your back makes contact with the back of the chair. This allows you to work closely.  The chair's height should allow your eyes to be level with the upper part of your monitor and your hands to be slightly lower than your elbows. BODY POSITION  Your feet should make contact with the floor. If this is not possible, use a foot rest.  Keep your ears over your shoulders. This will reduce stress on your neck and lower back. INCORRECT SITTING POSTURES  If you are feeling tired and unable to assume a healthy sitting posture, do not slouch or slump. This puts excessive strain on your back tissues, causing more damage and pain. Healthier options include:  Using more support, like a lumbar pillow.  Switching tasks to something that requires you to be upright or walking.  Talking a brief walk.  Lying down to rest in a neutral-spine position. PROLONGED STANDING WHILE SLIGHTLY LEANING FORWARD  When completing a task that requires you to lean forward while standing in one place for a long time, place either foot up on a stationary 2-4 inch high object to help maintain the best posture. When both feet are on the ground, the lower back tends to lose its slight inward curve. If this curve flattens (or becomes too large), then the back and your other joints will experience too much stress, tire more quickly, and can cause pain. CORRECT STANDING POSTURES Proper standing posture should be assumed with all daily activities, even if they only take a few moments, like when brushing your teeth. As in  sitting, your ears should fall over your shoulders and  your shoulders should fall over your hips. You should keep a slight tension in your abdominal muscles to brace your spine. Your tailbone should point down to the ground, not behind your body, resulting in an over-extended swayback posture.  INCORRECT STANDING POSTURES  Common incorrect standing postures include a forward head, locked knees and/or an excessive swayback. WALKING Walk with an upright posture. Your ears, shoulders and hips should all line-up. PROLONGED ACTIVITY IN A FLEXED POSITION When completing a task that requires you to bend forward at your waist or lean over a low surface, try to find a way to stabilize 3 out of 4 of your limbs. You can place a hand or elbow on your thigh or rest a knee on the surface you are reaching across. This will provide you more stability so that your muscles do not fatigue as quickly. By keeping your knees relaxed, or slightly bent, you will also reduce stress across your lower back. CORRECT LIFTING TECHNIQUES DO :   Assume a wide stance. This will provide you more stability and the opportunity to get as close as possible to the object which you are lifting.  Tense your abdominals to brace your spine. Bend at the knees and hips. Keeping your back locked in a neutral-spine position, lift using your leg muscles. Lift with your legs, keeping your back straight.  Test the weight of unknown objects before attempting to lift them.  Try to keep your elbows locked down at your sides in order get the best strength from your shoulders when carrying an object.  Always ask for help when lifting heavy or awkward objects. INCORRECT LIFTING TECHNIQUES DO NOT:   Lock your knees when lifting, even if it is a small object.  Bend and twist. Pivot at your feet or move your feet when needing to change directions.  Assume that you can safely pick up even a paper clip without proper posture.   This information is not intended to replace advice given to you by your  health care provider. Make sure you discuss any questions you have with your health care provider.   Document Released: 11/19/2005 Document Revised: 12/10/2014 Document Reviewed: 03/03/2009 Elsevier Interactive Patient Education 2016 [employer]  Muscle Strain A muscle strain is an injury that occurs when a muscle is stretched beyond its normal length. Usually a small number of muscle fibers are torn when this happens. Muscle strain is rated in degrees. First-degree strains have the least amount of muscle fiber tearing and pain. Second-degree and third-degree strains have increasingly more tearing and pain.  Usually, recovery from muscle strain takes 1-2 weeks. Complete healing takes 5-6 weeks.  CAUSES  Muscle strain happens when a sudden, violent force placed on a muscle stretches it too far. This may occur with lifting, sports, or a fall.  RISK FACTORS Muscle strain is especially common in athletes.  SIGNS AND SYMPTOMS At the site of the muscle strain, there may be:  Pain.  Bruising.  Swelling.  Difficulty using the muscle due to pain or lack of normal function. DIAGNOSIS  Your health care provider will perform a physical exam and ask about your medical history. TREATMENT  Often, the best treatment for a muscle strain is resting, icing, and applying cold compresses to the injured area.  HOME CARE INSTRUCTIONS   Use the PRICE method of treatment to promote muscle healing during the first 2-3 days after your injury. The PRICE method  involves:  Protecting the muscle from being injured again.  Restricting your activity and resting the injured body part.  Icing your injury. To do this, put ice in a plastic bag. Place a towel between your skin and the bag. Then, apply the ice and leave it on from 15-20 minutes each hour. After the third day, switch to moist heat packs.  Apply compression to the injured area with a splint or elastic bandage. Be careful not to wrap it too tightly.  This may interfere with blood circulation or increase swelling.  Elevate the injured body part above the level of your heart as often as you can.  Only take over-the-counter or prescription medicines for pain, discomfort, or fever as directed by your health care provider.  Warming up prior to exercise helps to prevent future muscle strains. SEEK MEDICAL CARE IF:   You have increasing pain or swelling in the injured area.  You have numbness, tingling, or a significant loss of strength in the injured area. MAKE SURE YOU:   Understand these instructions.  Will watch your condition.  Will get help right away if you are not doing well or get worse.   This information is not intended to replace advice given to you by your health care provider. Make sure you discuss any questions you have with your health care provider.   Document Released: 11/19/2005 Document Revised: 09/09/2013 Document Reviewed: 06/18/2013 Elsevier Interactive Patient Education [employer]2016 Elsevier Inc.

## 2016-03-04 NOTE — ED Provider Notes (Signed)
CSN: [id number]     Arrival date & time 03/04/16  1033 History   First MD Initiated Contact with Patient 03/04/16 1047     Chief Complaint  Patient presents with  . Back Pain     (Consider location/radiation/quality/duration/timing/severity/associated sxs/prior Treatment) Patient is a 55 y.o. female presenting with back pain.  Back Pain Associated symptoms: no abdominal pain, no chest pain, no dysuria, no fever, no headaches, no numbness and no weakness     18 -year-old female presents of her scar for evaluation of lower back pain. She says symptoms present for 2 weeks. No trauma or injury. She denies any flank pain urinary symptoms, no abdominal pain. She describes tightness and catching in the left and right paravertebral muscles of the lumbar spine. Pain is increased with flexion, she describes catching with flexion. She has tightness in the lower back. Occasional right leg radicular symptoms. No weakness or neurological deficits. Pain is 8/10, relieved with tylenol.  Past Medical History  Diagnosis Date  . Asthma     no issues over 1 yr  . Hypertension   . GERD (gastroesophageal reflux disease)   . History of hiatal hernia   . Difficult intubation   . Diabetes [facility])    Past Surgical History  Procedure Laterality Date  . Heel spur surgery Bilateral   . Breast surgery  2000    Reduction  . Rotator cuff repair  2009  . Tonsillectomy    . Tubal ligation    . Colonoscopy N/A 04/08/2015    Procedure: COLONOSCOPY;  Surgeon: Midge Minium, MD;  Location: [facility];  Service: Gastroenterology;  Laterality: N/A;  . Esophagogastroduodenoscopy (egd) with propofol N/A 07/07/2015    Procedure: ESOPHAGOGASTRODUODENOSCOPY (EGD) WITH PROPOFOL;  Surgeon: Midge Minium, MD;  Location: [facility];  Service: Endoscopy;  Laterality: N/A;  . 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 Diabetes Mother   . Hypertension Mother    Social History  Substance Use Topics  . Smoking status: Never Smoker   . Smokeless tobacco: Never Used  . Alcohol Use: 0.6 oz/week    1 Glasses of wine per week   OB History    No data available     Review of Systems  Constitutional: Negative for fever, chills, activity change and fatigue.  HENT: Negative for congestion, sinus pressure and sore throat.   Eyes: Negative for visual disturbance.  Respiratory: Negative for cough, chest tightness and shortness of breath.   Cardiovascular: Negative for chest pain and leg swelling.  Gastrointestinal: Negative for nausea, vomiting, abdominal pain and diarrhea.  Genitourinary: Negative for dysuria, urgency, frequency, hematuria and flank pain.  Musculoskeletal: Positive for back pain. Negative for arthralgias and gait problem.  Skin: Negative for rash.  Neurological: Negative for weakness, numbness and headaches.  Hematological: Negative for adenopathy.  Psychiatric/Behavioral: Negative for behavioral problems, confusion and agitation.      Allergies  Codeine and Hydrocodone  Home Medications   Prior to Admission medications   Medication Sig Start Date End Date Taking? Authorizing Provider  cyclobenzaprine (FLEXERIL) 5 MG tablet Take 1 tablet (5 mg total) by mouth every 8 (eight) hours as needed for muscle spasms. 03/04/16   Evon Slack, PA-C  guaiFENesin (ROBITUSSIN) 100 MG/5ML SOLN Take 10  mLs (200 mg total) by mouth every 4 (four) hours as needed for cough or to loosen phlegm. 11/17/15   Enedina Finner, MD  hydrochlorothiazide (HYDRODIURIL) 50 MG tablet Take 50 mg by mouth daily.    Historical Provider, MD  HYDROcodone-acetaminophen (HYCET) 7.5-325 mg/15 ml solution Take 15 mLs by mouth 4 (four) times daily as needed for moderate pain. 11/04/15 11/03/16  Everette Rank, MD  ondansetron (ZOFRAN ODT) 4 MG disintegrating tablet Take 1 tablet (4 mg total) by mouth every 8 (eight) hours as needed for nausea or vomiting. 11/04/15   Everette Rank, MD  Rivaroxaban (XARELTO) 15 MG TABS tablet Take 1 tablet (15 mg total) by mouth 2 (two) times daily. 11/17/15 12/08/15  Enedina Finner, MD  rivaroxaban (XARELTO) 20 MG TABS tablet Take 1 tablet (20 mg total) by mouth daily. 12/08/15   Enedina Finner, MD   BP 132/77 mmHg  Pulse 79  Temp(Src) 98.9 F (37.2 C) (Oral)  Resp 18  Ht  (1.651 m)  Wt 102.059 kg  BMI 37.44 kg/m2  SpO2 100% Physical Exam  Constitutional: She is oriented to person, place, and time. She appears well-developed and well-nourished. No distress.  HENT:  Head: Normocephalic and atraumatic.  Mouth/Throat: Oropharynx is clear and moist.  Eyes: EOM are normal. Pupils are equal, round, and reactive to light. Right eye exhibits no discharge. Left eye exhibits no discharge.  Neck: Normal range of motion. Neck supple.  Cardiovascular: Normal rate, regular rhythm and intact distal pulses.   Pulmonary/Chest: Effort normal and breath sounds normal. No respiratory distress. She exhibits no tenderness.  Abdominal: Soft. She exhibits no distension. There is no tenderness.  Musculoskeletal:  Lumbar Spine: Examination of the lumbar spine reveals no bony abnormality, no edema, and no ecchymosis.  There is no step off.  The patient has decreased ROM with flexion.  The patient has normal lateral bend and rotation.  The patient has mild lower back pain with flexion.  The patient has a negative axial load test, and a negative rotational Waddell test.  The patient is non tender along the spinous process.  The patient is  Tender along lower lumbar paravertebral muscles, with no muscle spasms.  The patient is non tender along the iliac crest.  The patient is non tender in the sciatic notch.  The patient is non tender along the Sacroiliac joint.  There is no Coccyx joint  tenderness.    Bilateral Lower Extremities: Examination of the lower extremities reveals no bony abnormality, no edema, and no ecchymosis.  The patient has full active and passive range of motion of the hips, knees, and ankles.  There is no discomfort with range of motion exercises.  The patient is non tender along the greater trochanter region.  The patient has a negative Denna Haggard' test bilaterally.  There is normal skin warmth.  There is normal capillary refill bilaterally.    Neurologic: The patient has a negative straight leg raise.  The patient has normal muscle strength testing for the quadriceps, calves, ankle dorsiflexion, ankle plantarflexion, and extensor hallicus longus.  The patient has sensation that is intact to light touch.  The deep tendon reflexes are nor  Neurological: She is alert and oriented to person, place, and time. She has normal reflexes.  Skin: Skin is warm and dry.  Psychiatric: She has a normal mood and affect. Her behavior is normal. Thought content normal.    ED Course  Procedures (including critical care time) Labs Review Labs  Reviewed - No data to display  Imaging Review No results found. I have personally reviewed and evaluated these images and lab results as part of my medical decision-making.   EKG Interpretation None      MDM   Final diagnoses:  Lumbar strain, initial encounter  Lumbar paraspinal muscle spasm    55 year old female with lower back pain, muscular in nature. Exam is unremarkable, no weakness or neurological deficits. She is unable tolerate NSAIDs due to Xarelto. Will place on Flexeril. She is educated on lumbar strain. Follow-up with orthopedics. Turn to the ER for any worsening symptoms urgent changes in her health.    Evon Slackhomas C Cloma Rahrig, PA-C 03/04/16 1115  Gayla DossEryka A Gayle, MD 03/04/16 [phone]

## 2016-03-04 NOTE — ED Notes (Signed)
Bilateral low back pain for 1 week. Painful with movement. Denies trauma, denies alterations in GI/GU.

## 2016-03-04 NOTE — ED Notes (Signed)
Discussed discharge instructions, prescriptions, and follow-up care with patient. No questions or concerns at this time. Pt stable at discharge.  

## 2016-05-04 ENCOUNTER — Other Ambulatory Visit: Payer: Self-pay | Admitting: Family Medicine

## 2016-05-04 DIAGNOSIS — N950 Postmenopausal bleeding: Secondary | ICD-10-CM

## 2016-05-10 ENCOUNTER — Ambulatory Visit: Payer: BLUE CROSS/BLUE SHIELD

## 2016-08-02 ENCOUNTER — Emergency Department
Admission: EM | Admit: 2016-08-02 | Discharge: 2016-08-02 | Disposition: A | Discharge status: Other Acute Inpatient Hospital | Payer: BLUE CROSS/BLUE SHIELD | Attending: Emergency Medicine | Admitting: Emergency Medicine

## 2016-08-02 ENCOUNTER — Emergency Department: Payer: BLUE CROSS/BLUE SHIELD

## 2016-08-02 ENCOUNTER — Encounter: Payer: Self-pay | Admitting: Emergency Medicine

## 2016-08-02 DIAGNOSIS — E119 Type 2 diabetes mellitus without complications: Secondary | ICD-10-CM | POA: Insufficient documentation

## 2016-08-02 DIAGNOSIS — J45909 Unspecified asthma, uncomplicated: Secondary | ICD-10-CM | POA: Diagnosis not present

## 2016-08-02 DIAGNOSIS — K5660 Unspecified intestinal obstruction: Secondary | ICD-10-CM | POA: Diagnosis not present

## 2016-08-02 DIAGNOSIS — Z79899 Other long term (current) drug therapy: Secondary | ICD-10-CM | POA: Insufficient documentation

## 2016-08-02 DIAGNOSIS — Z7901 Long term (current) use of anticoagulants: Secondary | ICD-10-CM | POA: Diagnosis not present

## 2016-08-02 DIAGNOSIS — K56609 Unspecified intestinal obstruction, unspecified as to partial versus complete obstruction: Secondary | ICD-10-CM

## 2016-08-02 DIAGNOSIS — I10 Essential (primary) hypertension: Secondary | ICD-10-CM | POA: Diagnosis not present

## 2016-08-02 DIAGNOSIS — R1013 Epigastric pain: Secondary | ICD-10-CM | POA: Diagnosis present

## 2016-08-02 DIAGNOSIS — Z9884 Bariatric surgery status: Secondary | ICD-10-CM | POA: Insufficient documentation

## 2016-08-02 LAB — COMPREHENSIVE METABOLIC PANEL
ALBUMIN: 4.1 g/dL (ref 3.5–5.0)
ALT: 22 U/L (ref 14–54)
ANION GAP: 7 (ref 5–15)
AST: 33 U/L (ref 15–41)
Alkaline Phosphatase: 92 U/L (ref 38–126)
BUN: 13 mg/dL (ref 6–20)
CHLORIDE: 103 mmol/L (ref 101–111)
CO2: 29 mmol/L (ref 22–32)
Calcium: 9.6 mg/dL (ref 8.9–10.3)
Creatinine, Ser: 0.87 mg/dL (ref 0.44–1.00)
GFR calc Af Amer: >60 mL/min (ref >60)
GFR calc non Af Amer: >60 mL/min (ref >60)
GLUCOSE: 209 mg/dL — AB (ref 65–99)
POTASSIUM: 3.1 mmol/L — AB (ref 3.5–5.1)
SODIUM: 139 mmol/L (ref 135–145)
TOTAL PROTEIN: 7.8 g/dL (ref 6.5–8.1)
Total Bilirubin: 0.6 mg/dL (ref 0.3–1.2)

## 2016-08-02 LAB — CBC
HEMATOCRIT: 41.6 % (ref 35.0–47.0)
HEMOGLOBIN: 13.8 g/dL (ref 12.0–16.0)
MCH: 30.5 pg (ref 26.0–34.0)
MCHC: 33 g/dL (ref 32.0–36.0)
MCV: 92.3 fL (ref 80.0–100.0)
Platelets: 261 10*3/uL (ref 150–440)
RBC: 4.51 MIL/uL (ref 3.80–5.20)
RDW: 14.9 % — ABNORMAL HIGH (ref 11.5–14.5)
WBC: 8.5 10*3/uL (ref 3.6–11.0)

## 2016-08-02 LAB — URINALYSIS COMPLETE WITH MICROSCOPIC (ARMC ONLY)
Bilirubin Urine: NEGATIVE
Glucose, UA: NEGATIVE mg/dL
Hgb urine dipstick: NEGATIVE
KETONES UR: NEGATIVE mg/dL
Leukocytes, UA: NEGATIVE
NITRITE: NEGATIVE
PH: 7 (ref 5.0–8.0)
PROTEIN: NEGATIVE mg/dL
Specific Gravity, Urine: 1.018 (ref 1.005–1.030)

## 2016-08-02 LAB — TROPONIN I

## 2016-08-02 LAB — LACTIC ACID, PLASMA
LACTIC ACID, VENOUS: 2.2 mmol/L — AB (ref 0.5–1.9)
Lactic Acid, Venous: 1.8 mmol/L (ref 0.5–1.9)

## 2016-08-02 LAB — LIPASE, BLOOD: LIPASE: 27 U/L (ref 11–51)

## 2016-08-02 MED ORDER — FENTANYL CITRATE (PF) 100 MCG/2ML IJ SOLN
INTRAMUSCULAR | Status: AC
  Filled 2016-08-02: qty 2

## 2016-08-02 MED ORDER — IOPAMIDOL (ISOVUE-300) INJECTION 61%
30.0000 mL | Freq: Once | INTRAVENOUS | Status: AC | PRN
  Administered 2016-08-02: 30 mL via ORAL

## 2016-08-02 MED ORDER — FENTANYL CITRATE (PF) 100 MCG/2ML IJ SOLN
50.0000 ug | Freq: Once | INTRAMUSCULAR | Status: AC
Start: 2016-08-02 — End: 2016-08-02
  Administered 2016-08-02: 50 ug via INTRAVENOUS
  Filled 2016-08-02: qty 2

## 2016-08-02 MED ORDER — FENTANYL CITRATE (PF) 100 MCG/2ML IJ SOLN
50.0000 ug | Freq: Once | INTRAMUSCULAR | Status: AC
  Administered 2016-08-02: 50 ug via INTRAVENOUS

## 2016-08-02 MED ORDER — SODIUM CHLORIDE 0.9 % IV BOLUS (SEPSIS)
1000.0000 mL | Freq: Once | INTRAVENOUS | Status: AC
  Administered 2016-08-02: 1000 mL via INTRAVENOUS

## 2016-08-02 MED ORDER — IOPAMIDOL (ISOVUE-300) INJECTION 61%
100.0000 mL | Freq: Once | INTRAVENOUS | Status: AC | PRN
  Administered 2016-08-02: 100 mL via INTRAVENOUS

## 2016-08-02 MED ORDER — FENTANYL CITRATE (PF) 100 MCG/2ML IJ SOLN
100.0000 ug | Freq: Once | INTRAMUSCULAR | Status: AC
  Administered 2016-08-02: 100 ug via INTRAVENOUS

## 2016-08-02 MED ORDER — ONDANSETRON HCL 4 MG/2ML IJ SOLN
4.0000 mg | Freq: Once | INTRAMUSCULAR | Status: AC
  Administered 2016-08-02: 4 mg via INTRAVENOUS
  Filled 2016-08-02: qty 2

## 2016-08-02 MED ORDER — FENTANYL CITRATE (PF) 100 MCG/2ML IJ SOLN
50.0000 ug | Freq: Once | INTRAMUSCULAR | Status: AC
  Administered 2016-08-02: 50 ug via INTRAVENOUS
  Filled 2016-08-02: qty 2

## 2016-08-02 NOTE — ED Provider Notes (Signed)
[facility] Emergency Department Provider Note   ____________________________________________   First MD Initiated Contact with Patient 08/02/16 1208     (approximate)  I have reviewed the triage vital signs and the nursing notes.   HISTORY  Chief Complaint Nausea; Emesis; and Abdominal Pain   HPI Erica Powers is a 55 y.o. 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 She says that she has had multiple episodes of vomiting and is now having dry heaves. Denies any diarrhea. Says that she thinks she has been passing gas today. Says that her pain is radiating through to her back. Says the pain is an 8 out of 10 at this time. Any known sick contacts. Says that she has been having chills but did not take her temperature did confirm a fever at home. Had her surgery done by Dr. Alva Garnetyner [facility] at [facility]. Had blood clots afterwards and was on Xarelto but has been off this now for months.   Past Medical History:  Diagnosis Date  . Asthma    no issues over 1 yr  . Diabetes (HCC)   . Difficult intubation   . GERD (gastroesophageal reflux disease)   . History of hiatal hernia   . Hypertension     Patient Active Problem List   Diagnosis Date Noted  . Pulmonary emboli (HCC) 11/15/2015  . Bariatric surgery status 11/01/2015  . Diabetes mellitus, type 2 (HCC) 07/13/2015  . Arthritis, degenerative 07/13/2015  . Preop examination   . Morbid obesity (HCC)     Past Surgical History:  Procedure Laterality Date  . BREAST SURGERY  2000   Reduction  . COLONOSCOPY N/A 04/08/2015   Procedure: COLONOSCOPY;  Surgeon: Midge Miniumarren Wohl, MD;  Location[facility];  Service: Gastroenterology;  Laterality: N/A;  . ESOPHAGOGASTRODUODENOSCOPY (EGD) WITH PROPOFOL N/A 07/07/2015   Procedure: ESOPHAGOGASTRODUODENOSCOPY (EGD) WITH PROPOFOL;  Surgeon: Midge Miniumarren Wohl,  MD;  [facility];  Service: Endoscopy;  Laterality: N/A;  . GASTRIC ROUX-EN-Y N/A 11/01/2015   Procedure: LAPAROSCOPIC ROUX-EN-Y GASTRIC, HIATAL HERNIA REPAIR, EXTENSIVE LYSIS OF ADHESIONS;  Surgeon: Everette RankMichael A Tyner, MD;  Location: [facility];  Service: General;  Laterality: N/A;  . HEEL SPUR SURGERY Bilateral   . ROTATOR CUFF REPAIR  2009  . TONSILLECTOMY    . TUBAL LIGATION      Prior to Admission medications   Medication Sig Start Date End Date Taking? Authorizing Provider  cyclobenzaprine (FLEXERIL) 5 MG tablet Take 1 tablet (5 mg total) by mouth every 8 (eight) hours as needed for muscle spasms. 03/04/16   Evon Slackhomas C Gaines, PA-C  guaiFENesin (ROBITUSSIN) 100 MG/5ML SOLN Take 10 mLs (200 mg total) by mouth every 4 (four) hours as needed for cough or to loosen phlegm. 11/17/15   Enedina FinnerSona Patel, MD  hydrochlorothiazide (HYDRODIURIL) 50 MG tablet Take 50 mg by mouth daily.    Historical Provider, MD  HYDROcodone-acetaminophen (HYCET) 7.5-325 mg/15 ml solution Take 15 mLs by mouth 4 (four) times daily as needed for moderate pain. 11/04/15 11/03/16  Everette RankMichael A Tyner, MD  ondansetron (ZOFRAN ODT) 4 MG disintegrating tablet Take 1 tablet (4 mg total) by mouth every 8 (eight) hours as needed for nausea or vomiting. 11/04/15   Everette RankMichael A Tyner, MD  Rivaroxaban (XARELTO) 15 MG TABS tablet Take 1 tablet (15 mg total) by mouth 2 (two) times daily. 11/17/15 12/08/15  Enedina FinnerSona Patel, MD  rivaroxaban (XARELTO) 20 MG TABS tablet Take 1 tablet (20 mg total) by mouth daily. 12/08/15   Enedina Finner, MD    Allergies Codeine and Hydrocodone  Family History  Problem Relation Age of Onset  . Diabetes Mother   . Hypertension Mother     Social History Social History  Substance Use Topics  . Smoking status: Never Smoker  . Smokeless tobacco: Never Used  . Alcohol use 0.6 oz/week    1 Glasses of wine per week    Review of Systems Constitutional: chills Eyes: No visual changes. ENT: No sore  throat. Cardiovascular: Denies chest pain. Respiratory: Denies shortness of breath. Gastrointestinal:   No diarrhea.  No constipation. Genitourinary: Negative for dysuria. Musculoskeletal: Negative for back pain. Skin: Negative for rash. Neurological: Negative for headaches, focal weakness or numbness.  10-point ROS otherwise negative.  ____________________________________________   PHYSICAL EXAM:  VITAL SIGNS: ED Triage Vitals  Enc Vitals Group     BP 08/02/16 1232 (!) 180/97     Pulse Rate 08/02/16 1232 62     Resp 08/02/16 1232 20     Temp 08/02/16 1239 97.5 F (36.4 C)     Temp src --      SpO2 08/02/16 1232 100 %     Weight 08/02/16 1121 199 lb (90.3 kg)     Height 08/02/16 1121 5\' 5"  (1.651 m)     Head Circumference --      Peak Flow --      Pain Score 08/02/16 1121 10     Pain Loc --      Pain Edu? --      Excl. in GC? --     Constitutional: Alert and oriented. Appears uncomfortable Eyes: Conjunctivae are normal. PERRL. EOMI. Head: Atraumatic. Nose: No congestion/rhinnorhea. Mouth/Throat: Mucous membranes are moist.   Neck: No stridor.   Cardiovascular: Normal rate, regular rhythm. Grossly normal heart sounds.  Good peripheral circulation. Respiratory: Normal respiratory effort.  No retractions. Lungs CTAB. Gastrointestinal: Soft with upper abdominal tenderness to palpation which is moderate. No rebound or guarding.. No distention.  Musculoskeletal: No lower extremity tenderness nor edema.  No joint effusions. Neurologic:  Normal speech and language. No gross focal neurologic deficits are appreciated.  Skin:  Skin is warm, dry and intact. No rash noted. Psychiatric: Mood and affect are normal. Speech and behavior are normal.  ____________________________________________   LABS (all labs ordered are listed, but only abnormal results are displayed)  Labs Reviewed  COMPREHENSIVE METABOLIC PANEL - Abnormal; Notable for the following:       Result Value    Potassium 3.1 (*)    Glucose, Bld 209 (*)    All other components within normal limits  CBC - Abnormal; Notable for the following:    RDW 14.9 (*)    All other components within normal limits  LIPASE, BLOOD  URINALYSIS COMPLETEWITH MICROSCOPIC ([facility] ONLY)  TROPONIN I  LACTIC ACID, PLASMA  LACTIC ACID, PLASMA   ____________________________________________  EKG  ED ECG REPORT I, [name], the attending physician, personally viewed and interpreted this ECG.   Date: 08/02/2016  EKG Time: 1124  Rate: 62  Rhythm: normal sinus rhythm  Axis: Normal  Intervals:none  ST&T Change: No ST segment elevation or depression. No abnormal T-wave inversion  ____________________________________________  RADIOLOGY  CT Abdomen Pelvis W Contrast (Accession [id number]) (Order [id number])  Imaging  Date: 08/02/2016 Department: [facility] EMERGENCY DEPARTMENT Released By/Authorizing: Myrna Blazer, MD (auto-released)  PACS  Images   Show images for CT Abdomen Pelvis W Contrast  Study Result   CLINICAL DATA:  Epigastric abdomen pain today. History of epigastric bypass.  EXAM: CT ABDOMEN AND PELVIS WITH CONTRAST  TECHNIQUE: Multidetector CT imaging of the abdomen and pelvis was performed using the standard protocol following bolus administration of intravenous contrast.  CONTRAST:  ISOVUE-300 IOPAMIDOL (ISOVUE-300) INJECTION 61%  COMPARISON:  November 03, 2015  FINDINGS: Lower chest: Mild dependent atelectasis of the posterior lung bases are identified. There is no focal pneumonia or pleural effusion.  Hepatobiliary: The liver is normal. The patient is status post prior cholecystectomy. The biliary tree is normal.  Pancreas: No mass, inflammatory changes, or other significant abnormality.  Spleen: Within normal limits in size and appearance.  Adrenals/Urinary Tract: The adrenal glands and kidneys are normal. The bladder is  normal. No masses identified. No evidence of hydronephrosis.  Stomach/Bowel: The patient status post prior gastric bypass. Patient status post prior surgery of the small bowel loops in the mid through lower abdomen. There are multiple dilated small bowel loops throughout the upper to mid abdomen. The small bowel loops in the lower abdomen to pelvis are normal in caliber but there is marked inflammation of the mesenteric fat throughout the lower abdomen and pelvis.  Vascular/Lymphatic: No pathologically enlarged lymph nodes. No evidence of abdominal aortic aneurysm.  Reproductive: No mass or other significant abnormality.  Other: None.  Musculoskeletal: Degenerative joint changes of the spine are identified.  IMPRESSION: There are multiple dilated small bowel loops of the upper to mid abdomen throughout the abdomen. The small bowel loops in the lower abdomen and pelvis are less distended but there is marked surrounding mesenteric fat inflammation. Findings are consistent with at least partial small bowel obstruction of the upper to mid abdomen and small bowel loops due to the extensive inflammation in the pelvis. No definite abscess collection is identified.   Electronically Signed   By: Sherian Rein M.D.   On: 08/02/2016 14:34    ____________________________________________   PROCEDURES  Procedure(s) performed:   Procedures  Critical Care performed:   ____________________________________________   INITIAL IMPRESSION / ASSESSMENT AND PLAN / ED COURSE  Pertinent labs & imaging results that were available during my care of the patient were reviewed by me and considered in my medical decision making (see chart for details).  ----------------------------------------- 2:15 PM on 08/02/2016 -----------------------------------------  Discussed the case with Dr. Allayne Butcher PA, Dorathy Daft as well as PA Stowasser who recommend pursuing a CAT scan and reassessing at  that time. They say that she is unlikely to have a leak from her anastomoses at this time but would be concern for an ulcer. Patient was unable to by mouth secondary to pain.  Clinical Course   ----------------------------------------- 4:04 PM on 08/02/2016 -----------------------------------------  Patient with what appears to be small bowel obstruction on the CT scan. Accepted by Dr. Alva Garnet, confirmed by PA Dorathy Daft to [facility] where they will perform a laparoscopic surgery this evening. Updated patient about this and she is understanding the plan for transfer and willing to comply. No NG tube secondary to previous small bowel obstruction.  ____________________________________________   FINAL CLINICAL IMPRESSION(S) / ED DIAGNOSES  Bowel obstruction.    NEW MEDICATIONS STARTED DURING THIS VISIT:  New Prescriptions   No medications on file     Note:  This document was prepared using Dragon voice recognition software and may include unintentional dictation errors.    Myrna Blazer, MD 08/02/16 [phone]

## 2016-08-02 NOTE — ED Notes (Signed)
Patient taken off O2. Tolerating well. SpO2 99%.

## 2016-08-02 NOTE — ED Notes (Signed)
[facility] [phone]

## 2016-08-02 NOTE — ED Notes (Signed)
Patient unable to tolerate oral contrast. MD and CT notified.

## 2016-08-02 NOTE — ED Notes (Signed)
Patient has left via EMS to transport to [facility].

## 2016-08-02 NOTE — ED Notes (Signed)
Patient placed on 2L [place] due to SpO2 88%. Patient up to 100%.

## 2016-08-02 NOTE — ED Triage Notes (Addendum)
Pt to ed with c/o n/v that started at 0930 today.  Pt also reports abd pain, denies diarrhea. Pt currently vomiting at triage. Pt also noted to be diaphoretic.

## 2016-08-08 ENCOUNTER — Other Ambulatory Visit: Payer: Self-pay | Admitting: Family Medicine

## 2016-08-08 DIAGNOSIS — M79662 Pain in left lower leg: Secondary | ICD-10-CM

## 2016-08-09 ENCOUNTER — Ambulatory Visit
Admission: RE | Admit: 2016-08-09 | Discharge: 2016-08-09 | Disposition: A | Discharge status: Home / Self Care | Payer: BLUE CROSS/BLUE SHIELD | Source: Ambulatory Visit | Attending: Family Medicine | Admitting: Family Medicine

## 2016-08-09 ENCOUNTER — Ambulatory Visit: Payer: BLUE CROSS/BLUE SHIELD

## 2016-08-09 DIAGNOSIS — M79662 Pain in left lower leg: Secondary | ICD-10-CM | POA: Diagnosis not present

## 2017-01-17 ENCOUNTER — Other Ambulatory Visit: Payer: Self-pay | Admitting: Family Medicine

## 2017-01-17 DIAGNOSIS — Z1231 Encounter for screening mammogram for malignant neoplasm of breast: Secondary | ICD-10-CM

## 2017-01-28 ENCOUNTER — Ambulatory Visit: Payer: BLUE CROSS/BLUE SHIELD | Attending: Family Medicine

## 2017-04-10 ENCOUNTER — Ambulatory Visit
Admission: RE | Admit: 2017-04-10 | Discharge: 2017-04-10 | Disposition: A | Discharge status: Home / Self Care | Payer: BLUE CROSS/BLUE SHIELD | Source: Ambulatory Visit | Attending: Family Medicine | Admitting: Family Medicine

## 2017-04-10 DIAGNOSIS — Z1231 Encounter for screening mammogram for malignant neoplasm of breast: Secondary | ICD-10-CM

## 2017-04-18 ENCOUNTER — Ambulatory Visit
Admission: RE | Admit: 2017-04-18 | Discharge: 2017-04-18 | Disposition: A | Discharge status: Home / Self Care | Payer: BLUE CROSS/BLUE SHIELD | Source: Ambulatory Visit | Attending: Bariatrics | Admitting: Bariatrics

## 2017-04-18 ENCOUNTER — Other Ambulatory Visit: Payer: Self-pay | Admitting: Bariatrics

## 2017-04-18 DIAGNOSIS — K429 Umbilical hernia without obstruction or gangrene: Secondary | ICD-10-CM | POA: Insufficient documentation

## 2017-04-18 DIAGNOSIS — Z9049 Acquired absence of other specified parts of digestive tract: Secondary | ICD-10-CM | POA: Diagnosis not present

## 2017-04-18 DIAGNOSIS — R109 Unspecified abdominal pain: Secondary | ICD-10-CM | POA: Diagnosis present

## 2017-04-18 MED ORDER — IOPAMIDOL (ISOVUE-300) INJECTION 61%
100.0000 mL | Freq: Once | INTRAVENOUS | Status: AC | PRN
  Administered 2017-04-18: 100 mL via INTRAVENOUS

## 2017-07-12 ENCOUNTER — Encounter (INDEPENDENT_AMBULATORY_CARE_PROVIDER_SITE_OTHER): Payer: Self-pay | Admitting: Vascular Surgery

## 2017-07-12 ENCOUNTER — Ambulatory Visit (INDEPENDENT_AMBULATORY_CARE_PROVIDER_SITE_OTHER): Payer: BLUE CROSS/BLUE SHIELD | Admitting: Vascular Surgery

## 2017-07-12 VITALS — BP 111/70 | HR 58 | Resp 16 | Ht 65.0 in | Wt 190.0 lb

## 2017-07-12 DIAGNOSIS — E119 Type 2 diabetes mellitus without complications: Secondary | ICD-10-CM

## 2017-07-12 DIAGNOSIS — I83813 Varicose veins of bilateral lower extremities with pain: Secondary | ICD-10-CM

## 2017-07-12 DIAGNOSIS — M7989 Other specified soft tissue disorders: Secondary | ICD-10-CM | POA: Diagnosis not present

## 2017-07-12 NOTE — Assessment & Plan Note (Signed)
Venous disease is suspected. Likely some postphlebitic symptoms on the right. May have native venous disease on the left.

## 2017-07-12 NOTE — Patient Instructions (Signed)

## 2017-07-12 NOTE — Assessment & Plan Note (Signed)
See below

## 2017-07-12 NOTE — Progress Notes (Signed)
Patient ID: Erica Powers, female   DOB: Feb 05, 1961, 56 y.o.   MRN: [id number]  Chief Complaint  Patient presents with  . New Patient (Initial Visit)    Varicos eveins, eval for venous disease    HPI Erica Powers is a 56 y.o. female.  I am asked to see the patient by Phineas Real for evaluation of leg pain and swelling and varicose veins.  The patient presents with complaints of symptomatic varicosities of the lower extremities. The patient reports a long standing history of varicosities and they have become painful over time. There was no clear inciting event or causative factor that started the symptoms.  The left leg is more severly affected. The patient elevates the legs for relief. The pain is described as heaviness and aching as well as stinging overlying varicosities in the left lower leg. The symptoms are generally most severe in the evening, particularly when they have been on their feet for long periods of time. TED hose and elevation has been used to try to improve the symptoms with some success. The patient complains of frequent swelling as an associated symptom. The patient Does have a history of DVT in the right leg several years ago.     Past Medical History:  Diagnosis Date  . Difficult intubation   . GERD (gastroesophageal reflux disease)   . History of hiatal hernia     Past Surgical History:  Procedure Laterality Date  . BREAST BIOPSY Bilateral    [facility] b[facility] neg [facility]   . BREAST SURGERY  2000   Reduction  . COLONOSCOPY N/A 04/08/2015   Procedure: COLONOSCOPY;  Surgeon: Midge Minium, MD;  Location: [facility];  Service: Gastroenterology;  Laterality: N/A;  . ESOPHAGOGASTRODUODENOSCOPY (EGD) WITH PROPOFOL N/A 07/07/2015   Procedure: ESOPHAGOGASTRODUODENOSCOPY (EGD) WITH PROPOFOL;  Surgeon: Midge Minium, MD;  Location: [facility];  Service: Endoscopy;  Laterality: N/A;  . GASTRIC ROUX-EN-Y N/A 11/01/2015   Procedure: LAPAROSCOPIC ROUX-EN-Y  GASTRIC, HIATAL HERNIA REPAIR, EXTENSIVE LYSIS OF ADHESIONS;  Surgeon: Everette Rank, MD;  Location: [facility];  Service: General;  Laterality: N/A;  . HEEL SPUR SURGERY Bilateral   . REDUCTION MAMMAPLASTY Bilateral 2000  . ROTATOR CUFF REPAIR  2009  . TONSILLECTOMY    . TUBAL LIGATION      Family History  Problem Relation Age of Onset  . Diabetes Mother   . Hypertension Mother   No bleeding disorders, clotting disorders, or aneurysms  Social History Social History  Substance Use Topics  . Smoking status: Never Smoker  . Smokeless tobacco: Never Used  . Alcohol use 0.6 oz/week    1 Glasses of wine per week  No IV drug use  Allergies  Allergen Reactions  . Codeine Itching  . Hydrocodone Itching    Current Outpatient Prescriptions  Medication Sig Dispense Refill  . albuterol (PROVENTIL HFA;VENTOLIN HFA) 108 (90 Base) MCG/ACT inhaler PROAIR HFA 108 (90 Base) MCG/ACT AERS    . Calcium Carbonate-Vitamin D (CALTRATE 600+D) 600-400 MG-UNIT tablet Take by mouth.    . ferrous sulfate 325 (65 FE) MG EC tablet Take by mouth.    Marland Kitchen acetaminophen (TYLENOL) 500 MG tablet Take 500 mg by mouth every 6 (six) hours as needed.    . cyclobenzaprine (FLEXERIL) 5 MG tablet Take 5 mg by mouth.    . hydrochlorothiazide (HYDRODIURIL) 25 MG tablet Take 25 mg by mouth daily.    . ondansetron (ZOFRAN-ODT) 4 MG disintegrating tablet ondansetron  4 mg disintegrating tablet     No current facility-administered medications for this visit.       REVIEW OF SYSTEMS (Negative unless checked)  Constitutional: [x] Weight loss  [] Fever  [] Chills Cardiac: [] Chest pain   [] Chest pressure   [] Palpitations   [] Shortness of breath when laying flat   [] Shortness of breath at rest   [] Shortness of breath with exertion. Vascular:  [] Pain in legs with walking   [] Pain in legs at rest   [] Pain in legs when laying flat   [] Claudication   [] Pain in feet when walking  [] Pain in feet at rest  [] Pain in feet when laying  flat   [x] History of DVT   [x] Phlebitis   [x] Swelling in legs   [x] Varicose veins   [] Non-healing ulcers Pulmonary:   [] Uses home oxygen   [] Productive cough   [] Hemoptysis   [] Wheeze  [] COPD   [] Asthma Neurologic:  [] Dizziness  [] Blackouts   [] Seizures   [] History of stroke   [] History of TIA  [] Aphasia   [] Temporary blindness   [] Dysphagia   [] Weakness or numbness in arms   [] Weakness or numbness in legs Musculoskeletal:  [] Arthritis   [] Joint swelling   [] Joint pain   [] Low back pain Hematologic:  [] Easy bruising  [] Easy bleeding   [] Hypercoagulable state   [] Anemic  [] Hepatitis Gastrointestinal:  [] Blood in stool   [] Vomiting blood  [] Gastroesophageal reflux/heartburn   [] Abdominal pain Genitourinary:  [] Chronic kidney disease   [] Difficult urination  [] Frequent urination  [] Burning with urination   [] Hematuria Skin:  [] Rashes   [] Ulcers   [] Wounds Psychological:  [] History of anxiety   []  History of major depression.    Physical Exam BP 111/70 (BP Location: Right Arm)   Pulse (!) 58   Resp 16   Ht 5\' 5"  (1.651 m)   Wt 190 lb (86.2 kg)   BMI 31.62 kg/m  Gen:  WD/WN, NAD Head: [place]/AT, No temporalis wasting.  Ear/Nose/Throat: Hearing grossly intact, dentition good Eyes: Sclera non-icteric. Conjunctiva clear Neck: Supple, no nuchal rigidity. Trachea midline Pulmonary:  Good air movement, no use of accessory muscles, respirations not labored.  Cardiac: RRR, No JVD Vascular: Varicosities diffuse and measuring up to 2-3 mm in the right lower extremity        Varicosities diffuse and measuring up to 2 mm in the left lower extremity Vessel Right Left  Radial Palpable Palpable                          PT Palpable Palpable  DP Palpable Palpable   Gastrointestinal: soft, non-tender/non-distended.  Musculoskeletal: M/S 5/5 throughout.   1 + RLE edema.  1 + LLE edema Neurologic: Sensation grossly intact in extremities.  Symmetrical.  Speech is fluent.  Psychiatric: Judgment intact,  Mood & affect appropriate for pt's clinical situation. Dermatologic: No rashes or ulcers noted.  No cellulitis or open wounds.    Radiology No results found.  Labs No results found for this or any previous visit (from the past 2160 hour(s)).  Assessment/Plan:  Swelling of limb Venous disease is suspected. Likely some postphlebitic symptoms on the right. May have native venous disease on the left.  Varicose veins of leg with pain, bilateral See below  Diabetes mellitus, type 2 blood glucose control important in reducing the progression of atherosclerotic disease. Also, involved in wound healing. On appropriate medications.     The patient has symptoms consistent with chronic venous insufficiency. We discussed the natural  history and treatment options for venous disease. I recommended the regular use of 20 - 30 mm Hg compression stockings, and prescribed these today. I recommended leg elevation and anti-inflammatories as needed for pain. I have also recommended a complete venous duplex to assess the venous system for reflux or thrombotic issues. This can be done at the patient's convenience. I will see the patient back in 3 months to assess the response to conservative management, and determine further treatment options.     Festus BarrenJason [name] 07/12/2017, 4:01 PM   This note was created with Dragon medical transcription system.  Any errors from dictation are unintentional.

## 2017-07-12 NOTE — Assessment & Plan Note (Signed)
blood glucose control important in reducing the progression of atherosclerotic disease. Also, involved in wound healing. On appropriate medications.  

## 2017-10-22 ENCOUNTER — Ambulatory Visit (INDEPENDENT_AMBULATORY_CARE_PROVIDER_SITE_OTHER): Payer: BLUE CROSS/BLUE SHIELD

## 2017-10-22 ENCOUNTER — Encounter (INDEPENDENT_AMBULATORY_CARE_PROVIDER_SITE_OTHER): Payer: Self-pay

## 2017-10-22 ENCOUNTER — Encounter (INDEPENDENT_AMBULATORY_CARE_PROVIDER_SITE_OTHER): Payer: Self-pay | Admitting: Vascular Surgery

## 2017-10-22 ENCOUNTER — Ambulatory Visit (INDEPENDENT_AMBULATORY_CARE_PROVIDER_SITE_OTHER): Payer: BLUE CROSS/BLUE SHIELD | Admitting: Vascular Surgery

## 2017-10-22 VITALS — BP 172/86 | HR 56 | Resp 17 | Wt 193.0 lb

## 2017-10-22 DIAGNOSIS — I83813 Varicose veins of bilateral lower extremities with pain: Secondary | ICD-10-CM

## 2017-10-22 DIAGNOSIS — M7989 Other specified soft tissue disorders: Secondary | ICD-10-CM

## 2017-10-22 DIAGNOSIS — E119 Type 2 diabetes mellitus without complications: Secondary | ICD-10-CM | POA: Diagnosis not present

## 2017-10-22 NOTE — Progress Notes (Signed)
Patient ID: Erica LeeVeronica L Fruin, female   DOB: 03/28/1961, 56 y.o.   MRN: [id number]  No chief complaint on file.   HPI Erica Powers is a 56 y.o. female.  Patient returns in follow up of their venous disease.  They have done their best to comply with the prescribed conservative therapies of compression stockings, leg elevation, exercise, and this has improved her symptoms.  Her legs are less swollen and less achy than they have been.  No new ulceration or infection.  Venous duplex demonstrates no DVT or superficial thrombophlebitis.  There was only a small amount of reflux in the right common femoral vein as well as in the left common femoral vein, saphenofemoral junction, and the most proximal portion of the great saphenous vein but not the majority of the great saphenous vein.        Past Medical History:  Diagnosis Date  . Difficult intubation   . GERD (gastroesophageal reflux disease)   . History of hiatal hernia          Past Surgical History:  Procedure Laterality Date  . BREAST BIOPSY Bilateral    [facility]   . BREAST SURGERY  2000   Reduction  . COLONOSCOPY N/A 04/08/2015   Procedure: COLONOSCOPY;  Surgeon: Midge Miniumarren Wohl, MD;  [facility];  Service: Gastroenterology;  Laterality: N/A;  . ESOPHAGOGASTRODUODENOSCOPY (EGD) WITH PROPOFOL N/A 07/07/2015   Procedure: ESOPHAGOGASTRODUODENOSCOPY (EGD) WITH PROPOFOL;  Surgeon: Midge Miniumarren Wohl, MD;  [facility];  Service: Endoscopy;  Laterality: N/A;  . GASTRIC ROUX-EN-Y N/A 11/01/2015   Procedure: LAPAROSCOPIC ROUX-EN-Y GASTRIC, HIATAL HERNIA REPAIR, EXTENSIVE LYSIS OF ADHESIONS;  Surgeon: Everette RankMichael A Tyner, MD;  Location: [facility];  Service: General;  Laterality: N/A;  . HEEL SPUR SURGERY Bilateral   . REDUCTION MAMMAPLASTY Bilateral 2000  . ROTATOR CUFF REPAIR  2009  . TONSILLECTOMY    . TUBAL LIGATION           Family History  Problem Relation Age of Onset  .  Diabetes Mother   . Hypertension Mother   No bleeding disorders, clotting disorders, or aneurysms  Social History       Social History   Substance Use Topics   . Smoking status: Never Smoker   . Smokeless tobacco: Never Used   . Alcohol use 0.6 oz/week    1 Glasses of wine per week  No IV drug use      Allergies  Allergen Reactions  . Codeine Itching  . Hydrocodone Itching          Current Outpatient Prescriptions  Medication Sig Dispense Refill  . albuterol (PROVENTIL HFA;VENTOLIN HFA) 108 (90 Base) MCG/ACT inhaler PROAIR HFA 108 (90 Base) MCG/ACT AERS    . Calcium Carbonate-Vitamin D (CALTRATE 600+D) 600-400 MG-UNIT tablet Take by mouth.    . ferrous sulfate 325 (65 FE) MG EC tablet Take by mouth.    Marland Kitchen. acetaminophen (TYLENOL) 500 MG tablet Take 500 mg by mouth every 6 (six) hours as needed.    . cyclobenzaprine (FLEXERIL) 5 MG tablet Take 5 mg by mouth.    . hydrochlorothiazide (HYDRODIURIL) 25 MG tablet Take 25 mg by mouth daily.    . ondansetron (ZOFRAN-ODT) 4 MG disintegrating tablet ondansetron 4 mg disintegrating tablet     No current facility-administered medications for this visit.       REVIEW OF SYSTEMS (Negative unless checked)  Constitutional: [x] Weight loss  [] Fever  [] Chills Cardiac: []   Chest pain   [] Chest pressure   [] Palpitations   [] Shortness of breath when laying flat   [] Shortness of breath at rest   [] Shortness of breath with exertion. Vascular:  [] Pain in legs with walking   [] Pain in legs at rest   [] Pain in legs when laying flat   [] Claudication   [] Pain in feet when walking  [] Pain in feet at rest  [] Pain in feet when laying flat   [x] History of DVT   [x] Phlebitis   [x] Swelling in legs   [x] Varicose veins   [] Non-healing ulcers Pulmonary:   [] Uses home oxygen   [] Productive cough   [] Hemoptysis   [] Wheeze  [] COPD   [] Asthma Neurologic:  [] Dizziness  [] Blackouts   [] Seizures   [] History of stroke   [] History of TIA   [] Aphasia   [] Temporary blindness   [] Dysphagia   [] Weakness or numbness in arms   [] Weakness or numbness in legs Musculoskeletal:  [] Arthritis   [] Joint swelling   [] Joint pain   [] Low back pain Hematologic:  [] Easy bruising  [] Easy bleeding   [] Hypercoagulable state   [] Anemic  [] Hepatitis Gastrointestinal:  [] Blood in stool   [] Vomiting blood  [] Gastroesophageal reflux/heartburn   [] Abdominal pain Genitourinary:  [] Chronic kidney disease   [] Difficult urination  [] Frequent urination  [] Burning with urination   [] Hematuria Skin:  [] Rashes   [] Ulcers   [] Wounds Psychological:  [] History of anxiety   []  History of major depression.     Physical Exam There were no vitals taken for this visit. Gen:  WD/WN, NAD Head: [place]/AT, No temporalis wasting.  Ear/Nose/Throat: Hearing grossly intact, dentition good Eyes: Sclera non-icteric. Conjunctiva clear Neck: Supple. Trachea midline Pulmonary:  Good air movement, no use of accessory muscles, respirations not labored.  Cardiac: RRR, No JVD Vascular: Varicosities diffuse and measuring up to 2-3 mm in the right lower extremity        Varicosities diffuse and measuring up to 2 mm in the left lower extremity Vessel Right Left  Radial Palpable Palpable                          PT Palpable Palpable  DP Palpable Palpable    Musculoskeletal: M/S 5/5 throughout.   trace RLE edema.  1 + LLE edema Neurologic: Sensation grossly intact in extremities.  Symmetrical.  Speech is fluent.  Psychiatric: Judgment intact, Mood & affect appropriate for pt's clinical situation. Dermatologic: No rashes or ulcers noted.  No cellulitis or open wounds.    Radiology No results found.  Labs No results found for this or any previous visit (from the past 2160 hour(s)).  Assessment/Plan: Diabetes mellitus, type 2 blood glucose control important in reducing the progression of atherosclerotic disease. Also, involved in wound healing. On appropriate  medications.  Swelling of limb Venous disease is seen on duplex, worse on the left than the right. Likely some postphlebitic symptoms on the right.  Some deep venous reflux on the left, but no superficial reflux that would benefit from intervention.  May also have a component of lymphedema from chronic scarring of the lymphatic channels.  Compression stockings have been very helpful and she should continue to wear these.  Should her symptoms worsen, would consider a lymphedema pump as an adjuvant therapy.  No invasive therapy is likely to be of any benefit at this time.  She will contact our office if her symptoms worsen.      Festus BarrenJason Renelle Stegenga 10/22/2017, 3:50 PM

## 2017-12-08 ENCOUNTER — Emergency Department
Admission: EM | Admit: 2017-12-08 | Discharge: 2017-12-08 | Disposition: A | Discharge status: Home / Self Care | Payer: BLUE CROSS/BLUE SHIELD | Attending: Emergency Medicine | Admitting: Emergency Medicine

## 2017-12-08 ENCOUNTER — Encounter: Payer: Self-pay | Admitting: Intensive Care

## 2017-12-08 ENCOUNTER — Emergency Department: Payer: BLUE CROSS/BLUE SHIELD

## 2017-12-08 DIAGNOSIS — Z79899 Other long term (current) drug therapy: Secondary | ICD-10-CM | POA: Diagnosis not present

## 2017-12-08 DIAGNOSIS — E119 Type 2 diabetes mellitus without complications: Secondary | ICD-10-CM | POA: Diagnosis not present

## 2017-12-08 DIAGNOSIS — R1012 Left upper quadrant pain: Secondary | ICD-10-CM | POA: Insufficient documentation

## 2017-12-08 DIAGNOSIS — R109 Unspecified abdominal pain: Secondary | ICD-10-CM

## 2017-12-08 LAB — LIPASE, BLOOD: Lipase: 23 U/L (ref 11–51)

## 2017-12-08 LAB — URINALYSIS, COMPLETE (UACMP) WITH MICROSCOPIC
Bacteria, UA: NONE SEEN
Bilirubin Urine: NEGATIVE
Glucose, UA: NEGATIVE mg/dL
Hgb urine dipstick: NEGATIVE
Ketones, ur: NEGATIVE mg/dL
Leukocytes, UA: NEGATIVE
Nitrite: NEGATIVE
Protein, ur: NEGATIVE mg/dL
Specific Gravity, Urine: 1.016 (ref 1.005–1.030)
pH: 6 (ref 5.0–8.0)

## 2017-12-08 LAB — CBC
HCT: 39.4 % (ref 35.0–47.0)
Hemoglobin: 13.1 g/dL (ref 12.0–16.0)
MCH: 31.5 pg (ref 26.0–34.0)
MCHC: 33.3 g/dL (ref 32.0–36.0)
MCV: 94.5 fL (ref 80.0–100.0)
Platelets: 266 K/uL (ref 150–440)
RBC: 4.17 MIL/uL (ref 3.80–5.20)
RDW: 13.7 % (ref 11.5–14.5)
WBC: 10.1 K/uL (ref 3.6–11.0)

## 2017-12-08 LAB — COMPREHENSIVE METABOLIC PANEL
ALBUMIN: 4 g/dL (ref 3.5–5.0)
ALT: 13 U/L — ABNORMAL LOW (ref 14–54)
ANION GAP: 8 (ref 5–15)
AST: 20 U/L (ref 15–41)
Alkaline Phosphatase: 93 U/L (ref 38–126)
BUN: 17 mg/dL (ref 6–20)
CHLORIDE: 104 mmol/L (ref 101–111)
CO2: 27 mmol/L (ref 22–32)
Calcium: 9.1 mg/dL (ref 8.9–10.3)
Creatinine, Ser: 0.7 mg/dL (ref 0.44–1.00)
GFR calc Af Amer: >60 mL/min (ref >60)
GLUCOSE: 91 mg/dL (ref 65–99)
Potassium: 3.8 mmol/L (ref 3.5–5.1)
Sodium: 139 mmol/L (ref 135–145)
Total Bilirubin: 0.7 mg/dL (ref 0.3–1.2)
Total Protein: 7.1 g/dL (ref 6.5–8.1)

## 2017-12-08 MED ORDER — IOPAMIDOL (ISOVUE-300) INJECTION 61%
30.0000 mL | Freq: Once | INTRAVENOUS | Status: AC | PRN
Start: 2017-12-08 — End: 2017-12-08
  Administered 2017-12-08: 30 mL via ORAL
  Filled 2017-12-08: qty 30

## 2017-12-08 MED ORDER — LIDOCAINE 5 % EX PTCH: 1.0000 | MEDICATED_PATCH | CUTANEOUS | 0 refills | Status: AC

## 2017-12-08 MED ORDER — TRAMADOL HCL 50 MG PO TABS
50.0000 mg | ORAL_TABLET | Freq: Once | ORAL | Status: AC
  Administered 2017-12-08: 50 mg via ORAL
  Filled 2017-12-08: qty 1

## 2017-12-08 MED ORDER — IOPAMIDOL (ISOVUE-300) INJECTION 61%
100.0000 mL | Freq: Once | INTRAVENOUS | Status: AC | PRN
  Administered 2017-12-08: 100 mL via INTRAVENOUS
  Filled 2017-12-08: qty 100

## 2017-12-08 MED ORDER — MELOXICAM 15 MG PO TABS: 15.0000 mg | ORAL_TABLET | Freq: Every day | ORAL | 0 refills | Status: AC

## 2017-12-08 MED ORDER — TRAMADOL HCL 50 MG PO TABS: 50.0000 mg | ORAL_TABLET | Freq: Four times a day (QID) | ORAL | 0 refills | Status: AC | PRN

## 2017-12-08 MED ORDER — LIDOCAINE 5 % EX PTCH
1.0000 | MEDICATED_PATCH | CUTANEOUS | Status: DC
  Administered 2017-12-08: 1 via TRANSDERMAL
  Filled 2017-12-08: qty 1

## 2017-12-08 NOTE — ED Notes (Addendum)
FIRST NURSE NOTE:  Pain to left hip, states she fell a few weeks ago when it snowed. Pt ambulatory in lobby. Pt requesting wheelchair.

## 2017-12-08 NOTE — ED Triage Notes (Signed)
Patient c/o L side pain X 1 week. Saw PCP Thursday and told pain was from sciatic nerve

## 2017-12-08 NOTE — ED Provider Notes (Signed)
[facility] Emergency Department Provider Note  ____________________________________________  Time seen: Approximately 10:38 AM  I have reviewed the triage vital signs and the nursing notes.   HISTORY  Chief Complaint Flank Pain (L side)    HPI Erica Powers is a 57 y.o. female with PMH gastric bypass surgery and small bowel obstruction that presents to the emergency department for evaluation of left flank pain for 1 week.  Pain starts in her LUQ and radiates to her back and down to her buttocks. She is also urinating more frequently than normal and has felt nauseous.  She had one episode of diarrhea yesterday.  She saw her PCP on Thursday and was told that it could be her sciatic nerve and was given a prescription for prednisone and Flexeril, with no improvement.  She has also been taking Goody powders.  No SOB, CP, vomiting, dysuria, urgency.   Past Medical History:  Diagnosis Date  . Difficult intubation   . GERD (gastroesophageal reflux disease)   . History of hiatal hernia     Patient Active Problem List   Diagnosis Date Noted  . Swelling of limb 07/12/2017  . Varicose veins of leg with pain, bilateral 07/12/2017  . Pulmonary emboli (HCC) 11/15/2015  . Bariatric surgery status 11/01/2015  . Diabetes mellitus, type 2 (HCC) 07/13/2015  . Arthritis, degenerative 07/13/2015  . Preop examination   . Morbid obesity (HCC)     Past Surgical History:  Procedure Laterality Date  . BREAST BIOPSY Bilateral    [facility]   . BREAST SURGERY  2000   Reduction  . COLONOSCOPY N/A 04/08/2015   Procedure: COLONOSCOPY;  Surgeon: Midge Minium, MD;  Location: [facility];  Service: Gastroenterology;  Laterality: N/A;  . ESOPHAGOGASTRODUODENOSCOPY (EGD) WITH PROPOFOL N/A 07/07/2015   Procedure: ESOPHAGOGASTRODUODENOSCOPY (EGD) WITH PROPOFOL;  Surgeon: Midge Minium, MD;  Location: [facility];  Service: Endoscopy;  Laterality: N/A;  .  GASTRIC ROUX-EN-Y N/A 11/01/2015   Procedure: LAPAROSCOPIC ROUX-EN-Y GASTRIC, HIATAL HERNIA REPAIR, EXTENSIVE LYSIS OF ADHESIONS;  Surgeon: Everette Rank, MD;  Location: [facility] ORS;  Service: General;  Laterality: N/A;  . HEEL SPUR SURGERY Bilateral   . REDUCTION MAMMAPLASTY Bilateral 2000  . ROTATOR CUFF REPAIR  2009  . TONSILLECTOMY    . TUBAL LIGATION      Prior to Admission medications   Medication Sig Start Date End Date Taking? Authorizing Provider  acetaminophen (TYLENOL) 500 MG tablet Take 500 mg by mouth every 6 (six) hours as needed.    [provider]  albuterol (PROVENTIL HFA;VENTOLIN HFA) 108 (90 Base) MCG/ACT inhaler PROAIR HFA 108 (90 Base) MCG/ACT AERS 10/18/15   [provider]  Calcium Carbonate-Vitamin D (CALTRATE 600+D) 600-400 MG-UNIT tablet Take by mouth.    [provider]  cyclobenzaprine (FLEXERIL) 5 MG tablet Take 5 mg by mouth. 03/09/16   [provider]  ferrous sulfate 325 (65 FE) MG EC tablet Take by mouth.    [provider]  hydrochlorothiazide (HYDRODIURIL) 25 MG tablet Take 25 mg by mouth daily.    [provider]  lidocaine (LIDODERM) 5 % Place 1 patch onto the skin daily. Remove & Discard patch within 12 hours or as directed by MD 12/08/17 12/08/18  Enid Derry, PA-C  meloxicam (MOBIC) 15 MG tablet Take 1 tablet (15 mg total) by mouth daily for 10 days. 12/08/17 12/18/17  Enid Derry, PA-C  ondansetron (ZOFRAN-ODT) 4 MG disintegrating tablet ondansetron 4  mg disintegrating tablet    [provider]  traMADol (ULTRAM) 50 MG tablet Take 1 tablet (50 mg total) by mouth every 6 (six) hours as needed. 12/08/17 12/08/18  Enid Derry, PA-C    Allergies Codeine and Hydrocodone  Family History  Problem Relation Age of Onset  . Diabetes Mother   . Hypertension Mother     Social History Social History   Tobacco Use  . Smoking status: Never Smoker  . Smokeless tobacco: Never Used  Substance Use  Topics  . Alcohol use: Yes    Alcohol/week: 0.6 oz    Types: 1 Glasses of wine per week  . Drug use: No     Review of Systems  Constitutional: No fever/chills Cardiovascular: No chest pain. Respiratory: No SOB. Gastrointestinal: No vomiting.  Genitourinary: Negative for dysuria. Musculoskeletal: Negative for musculoskeletal pain. Skin: Negative for rash, abrasions, lacerations, ecchymosis. Neurological: Negative for headaches, numbness or tingling   ____________________________________________   PHYSICAL EXAM:  VITAL SIGNS: ED Triage Vitals  Enc Vitals Group     BP 12/08/17 1010 (!) 118/98     Pulse Rate 12/08/17 1010 71     Resp 12/08/17 1010 14     Temp 12/08/17 1010 98.4 F (36.9 C)     Temp Source 12/08/17 1010 Oral     SpO2 12/08/17 1010 100 %     Weight 12/08/17 1009 195 lb (88.5 kg)     Height 12/08/17 1009 5\' 3"  (1.6 m)     Head Circumference --      Peak Flow --      Pain Score 12/08/17 1008 10     Pain Loc --      Pain Edu? --      Excl. in GC? --      Constitutional: Alert and oriented. Well appearing and in no acute distress. Eyes: Conjunctivae are normal. PERRL. EOMI. Head: Atraumatic. ENT:      Ears:      Nose: No congestion/rhinnorhea.      Mouth/Throat: Mucous membranes are moist.  Neck: No stridor.  Cardiovascular: Normal rate, regular rhythm.  Good peripheral circulation. Respiratory: Normal respiratory effort without tachypnea or retractions. Lungs CTAB. Good air entry to the bases with no decreased or absent breath sounds. Gastrointestinal: Bowel sounds 4 quadrants. Minimal tenderness to palpation in LUQ. No guarding or rigidity. No palpable masses. No distention.  Musculoskeletal: Full range of motion to all extremities. No gross deformities appreciated. No tenderness to palpation over lumbar spine or lumbar muscles.  Neurologic:  Normal speech and language. No gross focal neurologic deficits are appreciated.  Skin:  Skin is warm, dry  and intact. No rash noted.   ____________________________________________   LABS (all labs ordered are listed, but only abnormal results are displayed)  Labs Reviewed  COMPREHENSIVE METABOLIC PANEL - Abnormal; Notable for the following components:      Result Value   ALT 13 (*)    All other components within normal limits  URINALYSIS, COMPLETE (UACMP) WITH MICROSCOPIC - Abnormal; Notable for the following components:   Color, Urine YELLOW (*)    APPearance CLEAR (*)    Squamous Epithelial / LPF 0-5 (*)    All other components within normal limits  CBC  LIPASE, BLOOD   ____________________________________________  EKG   ____________________________________________  RADIOLOGY Lexine Baton, personally viewed and evaluated these images (plain radiographs) as part of my medical decision making, as well as reviewing the written report by the radiologist.  Ct Abdomen  Pelvis W Contrast  Result Date: 12/08/2017 CLINICAL DATA:  Left abdominal pain, nausea EXAM: CT ABDOMEN AND PELVIS WITH CONTRAST TECHNIQUE: Multidetector CT imaging of the abdomen and pelvis was performed using the standard protocol following bolus administration of intravenous contrast. CONTRAST:  100mL ISOVUE-300 IOPAMIDOL (ISOVUE-300) INJECTION 61% COMPARISON:  04/18/2017 FINDINGS: Lower chest: Mild linear scarring in the left lower lobe. Hepatobiliary: Liver is within normal limits. Status post cholecystectomy. No intrahepatic or extrahepatic ductal dilatation. Pancreas: Within normal limits. Spleen: Within normal limits. Adrenals/Urinary Tract: Adrenal glands within normal limits. Kidneys are within normal limits.  No hydronephrosis. Bladder is within normal limits. Stomach/Bowel: Stomach is notable for postsurgical changes related to gastric bypass with patent gastrojejunostomy. No evidence of bowel obstruction. Normal appendix (series 2/image 50). Vascular/Lymphatic: No evidence of abdominal aortic aneurysm. No  suspicious abdominopelvic lymphadenopathy. Reproductive: Uterus is within normal limits. Bilateral ovaries are within normal limits. Other: No abdominopelvic ascites. Musculoskeletal: Degenerative changes of the visualized thoracolumbar spine. IMPRESSION: Status post gastric bypass. No evidence of bowel obstruction. Normal appendix. No CT findings to account for the patient's left abdominal pain. Electronically Signed   By: Charline BillsSriyesh  Krishnan M.D.   On: 12/08/2017 14:34    ____________________________________________    PROCEDURES  Procedure(s) performed:    Procedures    Medications  traMADol (ULTRAM) tablet 50 mg (50 mg Oral Given 12/08/17 1104)  traMADol (ULTRAM) tablet 50 mg (50 mg Oral Given 12/08/17 1205)  iopamidol (ISOVUE-300) 61 % injection 30 mL (30 mLs Oral Contrast Given 12/08/17 1258)  iopamidol (ISOVUE-300) 61 % injection 100 mL (100 mLs Intravenous Contrast Given 12/08/17 1418)     ____________________________________________   INITIAL IMPRESSION / ASSESSMENT AND PLAN / ED COURSE  Pertinent labs & imaging results that were available during my care of the patient were reviewed by me and considered in my medical decision making (see chart for details).  Review of the [place] CSRS was performed in accordance of the [facility] prior to dispensing any controlled drugs.   Patient presented to the emergency department for evaluation of left flank pain for 1 week.  Vital signs, labwork, and exam are reassuring.  No infection on urinalysis.  No acute abnormalities on CT scan.  Patient will be discharged home with prescriptions for meloxicam, tramadol, Lidoderm. Patient is to follow up with PCP as directed. Patient is given ED precautions to return to the ED for any worsening or new symptoms.     ____________________________________________  FINAL CLINICAL IMPRESSION(S) / ED DIAGNOSES  Final diagnoses:  Left flank pain      NEW MEDICATIONS STARTED DURING THIS VISIT:  ED Discharge  Orders        Ordered    meloxicam (MOBIC) 15 MG tablet  Daily     12/08/17 1454    traMADol (ULTRAM) 50 MG tablet  Every 6 hours PRN     12/08/17 1454    lidocaine (LIDODERM) 5 %  Every 24 hours     12/08/17 1454          This chart was dictated using voice recognition software/Dragon. Despite best efforts to proofread, errors can occur which can change the meaning. Any change was purely unintentional.    Enid DerryWagner, Laurencia Roma, PA-C 12/08/17 Carlis [id number]    Governor RooksLord, Rebecca, MD 12/12/17 [phone]

## 2018-03-10 ENCOUNTER — Other Ambulatory Visit: Payer: Self-pay | Admitting: Family Medicine

## 2018-03-10 DIAGNOSIS — Z1239 Encounter for other screening for malignant neoplasm of breast: Secondary | ICD-10-CM

## 2018-03-31 DIAGNOSIS — R768 Other specified abnormal immunological findings in serum: Secondary | ICD-10-CM | POA: Insufficient documentation

## 2018-03-31 DIAGNOSIS — M5136 Other intervertebral disc degeneration, lumbar region: Secondary | ICD-10-CM | POA: Insufficient documentation

## 2018-04-29 ENCOUNTER — Other Ambulatory Visit: Payer: Self-pay | Admitting: Family Medicine

## 2018-04-29 DIAGNOSIS — Z1231 Encounter for screening mammogram for malignant neoplasm of breast: Secondary | ICD-10-CM

## 2018-05-19 ENCOUNTER — Ambulatory Visit
Admission: RE | Admit: 2018-05-19 | Discharge: 2018-05-19 | Disposition: A | Discharge status: Home / Self Care | Payer: BLUE CROSS/BLUE SHIELD | Source: Ambulatory Visit | Attending: Family Medicine | Admitting: Family Medicine

## 2018-05-19 DIAGNOSIS — Z1231 Encounter for screening mammogram for malignant neoplasm of breast: Secondary | ICD-10-CM | POA: Diagnosis present

## 2018-10-31 ENCOUNTER — Encounter: Payer: Self-pay | Admitting: Emergency Medicine

## 2018-10-31 ENCOUNTER — Emergency Department: Payer: BLUE CROSS/BLUE SHIELD

## 2018-10-31 ENCOUNTER — Emergency Department
Admission: EM | Admit: 2018-10-31 | Discharge: 2018-10-31 | Disposition: A | Discharge status: Home / Self Care | Payer: BLUE CROSS/BLUE SHIELD | Attending: Emergency Medicine | Admitting: Emergency Medicine

## 2018-10-31 ENCOUNTER — Other Ambulatory Visit: Payer: Self-pay

## 2018-10-31 DIAGNOSIS — E119 Type 2 diabetes mellitus without complications: Secondary | ICD-10-CM | POA: Insufficient documentation

## 2018-10-31 DIAGNOSIS — M79662 Pain in left lower leg: Secondary | ICD-10-CM | POA: Diagnosis not present

## 2018-10-31 DIAGNOSIS — R0602 Shortness of breath: Secondary | ICD-10-CM | POA: Diagnosis not present

## 2018-10-31 DIAGNOSIS — J45909 Unspecified asthma, uncomplicated: Secondary | ICD-10-CM | POA: Diagnosis not present

## 2018-10-31 DIAGNOSIS — Z79899 Other long term (current) drug therapy: Secondary | ICD-10-CM | POA: Diagnosis not present

## 2018-10-31 DIAGNOSIS — M79605 Pain in left leg: Secondary | ICD-10-CM

## 2018-10-31 LAB — CBC WITH DIFFERENTIAL/PLATELET
Abs Immature Granulocytes: 0.02 10*3/uL (ref 0.00–0.07)
BASOS PCT: 0 %
Basophils Absolute: 0 10*3/uL (ref 0.0–0.1)
EOS ABS: 0 10*3/uL (ref 0.0–0.5)
EOS PCT: 0 %
HCT: 38.6 % (ref 36.0–46.0)
Hemoglobin: 12 g/dL (ref 12.0–15.0)
Immature Granulocytes: 0 %
Lymphocytes Relative: 34 %
Lymphs Abs: 2.7 10*3/uL (ref 0.7–4.0)
MCH: 29.7 pg (ref 26.0–34.0)
MCHC: 31.1 g/dL (ref 30.0–36.0)
MCV: 95.5 fL (ref 80.0–100.0)
MONO ABS: 0.6 10*3/uL (ref 0.1–1.0)
MONOS PCT: 7 %
Neutro Abs: 4.7 10*3/uL (ref 1.7–7.7)
Neutrophils Relative %: 59 %
PLATELETS: 279 10*3/uL (ref 150–400)
RBC: 4.04 MIL/uL (ref 3.87–5.11)
RDW: 13.7 % (ref 11.5–15.5)
WBC: 8.1 10*3/uL (ref 4.0–10.5)
nRBC: 0 % (ref 0.0–0.2)

## 2018-10-31 LAB — BASIC METABOLIC PANEL
ANION GAP: 6 (ref 5–15)
BUN: 19 mg/dL (ref 6–20)
CALCIUM: 8.8 mg/dL — AB (ref 8.9–10.3)
CO2: 29 mmol/L (ref 22–32)
Chloride: 105 mmol/L (ref 98–111)
Creatinine, Ser: 0.78 mg/dL (ref 0.44–1.00)
GFR calc Af Amer: >60 mL/min (ref >60)
GFR calc non Af Amer: >60 mL/min (ref >60)
GLUCOSE: 105 mg/dL — AB (ref 70–99)
Potassium: 4.4 mmol/L (ref 3.5–5.1)
Sodium: 140 mmol/L (ref 135–145)

## 2018-10-31 LAB — APTT: aPTT: 31 seconds (ref 24–36)

## 2018-10-31 LAB — TROPONIN I

## 2018-10-31 MED ORDER — IOHEXOL 300 MG/ML  SOLN
75.0000 mL | Freq: Once | INTRAMUSCULAR | Status: DC | PRN
  Filled 2018-10-31: qty 75

## 2018-10-31 MED ORDER — IOHEXOL 350 MG/ML SOLN
75.0000 mL | Freq: Once | INTRAVENOUS | Status: AC | PRN
  Administered 2018-10-31: 75 mL via INTRAVENOUS

## 2018-10-31 NOTE — ED Triage Notes (Signed)
Pt arrives POV and ambulatory to triage with c/o left leg pain in her left calf. Pt reports more use if her inhaler at this time. Pt reports hx of clots in her leg. Pt is in NAD.

## 2018-10-31 NOTE — ED Notes (Addendum)
Pt to the er for pain to the lateral left calf just distal to the knee. Pt has a hx of blood clots. Denies possibility of muscle strain or pull. No inflammation noted. Pt is in no acute distress.

## 2018-10-31 NOTE — ED Notes (Signed)
Per MD Malinda, if pt has neg angio, US left leg.

## 2018-10-31 NOTE — ED Notes (Signed)
Patient transported to Ultrasound 

## 2018-10-31 NOTE — Discharge Instructions (Addendum)
Return to the ER for new, worsening, persistent leg pain, swelling, weakness or numbness, difficulty breathing, chest pain, or any other new or worsening symptoms that concern you.

## 2018-10-31 NOTE — ED Provider Notes (Signed)
[facility] Emergency Department Provider Note ____________________________________________   First MD Initiated Contact with Patient 10/31/18 2125     (approximate)  I have reviewed the triage vital signs and the nursing notes.   HISTORY  Chief Complaint Leg Pain    HPI Erica Powers is a 57 y.o. female with PMH as noted below who presents with left leg pain over the last several days, gradual onset, atraumatic, and mainly located in the back of her calf.  The patient states that it is similar to pain she had there in the past when she had a DVT.  She developed a DVT after surgery, and is no longer on anticoagulation.  Patient also reports some mild intermittent shortness of breath over the last few days and has had to use her inhaler more often.  She denies chest pain.  She denies any swelling in the leg, and has no fever.   Past Medical History:  Diagnosis Date  . Asthma   . Difficult intubation   . GERD (gastroesophageal reflux disease)   . History of hiatal hernia     Patient Active Problem List   Diagnosis Date Noted  . Swelling of limb 07/12/2017  . Varicose veins of leg with pain, bilateral 07/12/2017  . Pulmonary emboli (HCC) 11/15/2015  . Bariatric surgery status 11/01/2015  . Diabetes mellitus, type 2 (HCC) 07/13/2015  . Arthritis, degenerative 07/13/2015  . Preop examination   . Morbid obesity (HCC)     Past Surgical History:  Procedure Laterality Date  . BREAST BIOPSY Bilateral    [facility]   . BREAST SURGERY  2000   Reduction  . COLONOSCOPY N/A 04/08/2015   Procedure: COLONOSCOPY;  Surgeon: Midge Minium, MD;  Location: [facility];  Service: Gastroenterology;  Laterality: N/A;  . ESOPHAGOGASTRODUODENOSCOPY (EGD) WITH PROPOFOL N/A 07/07/2015   Procedure: ESOPHAGOGASTRODUODENOSCOPY (EGD) WITH PROPOFOL;  Surgeon: Midge Minium, MD;  Location: [facility];  Service: Endoscopy;  Laterality: N/A;  . GASTRIC  ROUX-EN-Y N/A 11/01/2015   Procedure: LAPAROSCOPIC ROUX-EN-Y GASTRIC, HIATAL HERNIA REPAIR, EXTENSIVE LYSIS OF ADHESIONS;  Surgeon: Everette Rank, MD;  Location: [facility] ORS;  Service: General;  Laterality: N/A;  . HEEL SPUR SURGERY Bilateral   . REDUCTION MAMMAPLASTY Bilateral 2000  . ROTATOR CUFF REPAIR  2009  . TONSILLECTOMY    . TUBAL LIGATION      Prior to Admission medications   Medication Sig Start Date End Date Taking? Authorizing Provider  acetaminophen (TYLENOL) 500 MG tablet Take 500 mg by mouth every 6 (six) hours as needed.    [provider]  albuterol (PROVENTIL HFA;VENTOLIN HFA) 108 (90 Base) MCG/ACT inhaler PROAIR HFA 108 (90 Base) MCG/ACT AERS 10/18/15   [provider]  Calcium Carbonate-Vitamin D (CALTRATE 600+D) 600-400 MG-UNIT tablet Take by mouth.    [provider]  cyclobenzaprine (FLEXERIL) 5 MG tablet Take 5 mg by mouth. 03/09/16   [provider]  ferrous sulfate 325 (65 FE) MG EC tablet Take by mouth.    [provider]  hydrochlorothiazide (HYDRODIURIL) 25 MG tablet Take 25 mg by mouth daily.    [provider]  lidocaine (LIDODERM) 5 % Place 1 patch onto the skin daily. Remove & Discard patch within 12 hours or as directed by MD 12/08/17 12/08/18  Enid Derry, PA-C  ondansetron (ZOFRAN-ODT) 4 MG disintegrating tablet ondansetron 4 mg disintegrating tablet    [provider]  traMADol (ULTRAM) 50 MG  tablet Take 1 tablet (50 mg total) by mouth every 6 (six) hours as needed. 12/08/17 12/08/18  Enid DerryWagner, Ashley, PA-C    Allergies Codeine and Hydrocodone  Family History  Problem Relation Age of Onset  . Diabetes Mother   . Hypertension Mother   . Breast cancer Neg Hx     Social History Social History   Tobacco Use  . Smoking status: Never Smoker  . Smokeless tobacco: Never Used  Substance Use Topics  . Alcohol use: Yes    Alcohol/week: 1.0 standard drinks    Types: 1 Glasses of wine per week  .  Drug use: No    Review of Systems  Constitutional: No fever. Eyes: No visual changes. ENT: No sore throat. Cardiovascular: Denies chest pain. Respiratory: Positive for shortness of breath. Gastrointestinal: No vomiting or diarrhea. Genitourinary: Negative for flank pain.  Musculoskeletal: Negative for back pain.  Positive for left leg pain. Skin: Negative for rash. Neurological: Negative for headache.   ____________________________________________   PHYSICAL EXAM:  VITAL SIGNS: ED Triage Vitals [10/31/18 1940]  Enc Vitals Group     BP 111/60     Pulse Rate 65     Resp 18     Temp (!) 97.5 F (36.4 C)     Temp Source Oral     SpO2 100 %     Weight 206 lb (93.4 kg)     Height 5\' 5"  (1.651 m)     Head Circumference      Peak Flow      Pain Score 6     Pain Loc      Pain Edu?      Excl. in GC?     Constitutional: Alert and oriented. Well appearing and in no acute distress. Eyes: Conjunctivae are normal.  Head: Atraumatic. Nose: No congestion/rhinnorhea. Mouth/Throat: Mucous membranes are moist.   Neck: Normal range of motion.  Cardiovascular: Normal rate, regular rhythm. Grossly normal heart sounds.  Good peripheral circulation. Respiratory: Normal respiratory effort.  No retractions. Lungs CTAB. Gastrointestinal: No distention.  Musculoskeletal: No lower extremity edema.  Mild pain to the posterior left calf.  No swelling.  No popliteal tenderness.  FROM at the knee.  Extremities warm and well perfused.  Neurologic:  Normal speech and language. No gross focal neurologic deficits are appreciated.  Skin:  Skin is warm and dry. No rash noted. Psychiatric: Mood and affect are normal. Speech and behavior are normal.  ____________________________________________   LABS (all labs ordered are listed, but only abnormal results are displayed)  Labs Reviewed  BASIC METABOLIC PANEL - Abnormal; Notable for the following components:      Result Value   Glucose, Bld 105  (*)    Calcium 8.8 (*)    All other components within normal limits  TROPONIN I  CBC WITH DIFFERENTIAL/PLATELET  APTT   ____________________________________________  EKG  ED ECG REPORT I, Dionne BucySebastian Markale Birdsell, the attending physician, personally viewed and interpreted this ECG.  Date: 10/31/2018 EKG Time: 1953 Rate: 63 Rhythm: normal sinus rhythm QRS Axis: normal Intervals: normal ST/T Wave abnormalities: normal Narrative Interpretation: no evidence of acute ischemia  ____________________________________________  RADIOLOGY  CT chest: No acute PE or other acute abnormality US LLE: No acute DVT  ____________________________________________   PROCEDURES  Procedure(s) performed: No  Procedures  Critical Care performed: No ____________________________________________   INITIAL IMPRESSION / ASSESSMENT AND PLAN / ED COURSE  Pertinent labs & imaging results that were available during my care of the patient were  reviewed by me and considered in my medical decision making (see chart for details).  57 year old female with PMH as noted above including prior history of a DVT after surgery presents with left lower leg pain over the last few days.  The patient also reports some shortness of breath but no chest pain.  On exam she is well-appearing and her vital signs are normal.  Her O2 saturations 100%.  She has mild tenderness behind the left calf but no swelling or other acute findings.  Lab work-up obtained from triage is within normal limits.  A CT chest was also obtained and shows no PE or other acute abnormalities.  I reviewed the past medical records in Epic; the patient was admitted in December 2016 for a PE.  She is no longer on anticoagulation.  Overall I suspect most likely muscle strain or other benign cause.  Given her elevated risk we will obtain an ultrasound to rule out DVT.  If it is negative I anticipate discharge home.  There is no evidence of concerning  acute cause for the shortness of breath and especially given the normal vital signs, negative labs, and negative CT, it is most consistent with mild bronchitis.  There is no indication for acute treatment.  ----------------------------------------- 10:50 PM on 10/31/2018 -----------------------------------------  The DVT study is negative.  The patient is stable for discharge home.  I counseled her on the results of the work-up.  Return precautions given, and she expresses understanding. ____________________________________________   FINAL CLINICAL IMPRESSION(S) / ED DIAGNOSES  Final diagnoses:  Left leg pain      NEW MEDICATIONS STARTED DURING THIS VISIT:  New Prescriptions   No medications on file     Note:  This document was prepared using Dragon voice recognition software and may include unintentional dictation errors.    Dionne Bucy, MD 10/31/18 2250

## 2019-01-05 NOTE — Discharge Instructions (Signed)
°  Instructions after Total Knee Replacement ° ° Travoris Bushey P. Lindia Garms, Jr., M.D.    ° Dept. of Orthopaedics & Sports Medicine ° [facility] ° [address] ° [place], [place]  [address] ° Phone: [phone]   Fax: [phone] ° °  °DIET: °• Drink plenty of non-alcoholic fluids. °• Resume your normal diet. Include foods high in fiber. ° °ACTIVITY:  °• You may use crutches or a walker with weight-bearing as tolerated, unless instructed otherwise. °• You may be weaned off of the walker or crutches by your Physical Therapist.  °• Do NOT place pillows under the knee. Anything placed under the knee could limit your ability to straighten the knee.   °• Continue doing gentle exercises. Exercising will reduce the pain and swelling, increase motion, and prevent muscle weakness.   °• Please continue to use the TED compression stockings for 6 weeks. You may remove the stockings at night, but should reapply them in the morning. °• Do not drive or operate any equipment until instructed. ° °WOUND CARE:  °• Continue to use the PolarCare or ice packs periodically to reduce pain and swelling. °• You may bathe or shower after the staples are removed at the first office visit following surgery. ° °MEDICATIONS: °• You may resume your regular medications. °• Please take the pain medication as prescribed on the medication. °• Do not take pain medication on an empty stomach. °• You have been given a prescription for a blood thinner (Lovenox or Coumadin). Please take the medication as instructed. (NOTE: After completing a 2 week course of Lovenox, take one Enteric-coated aspirin once a day. This along with elevation will help reduce the possibility of phlebitis in your operated leg.) °• Do not drive or drink alcoholic beverages when taking pain medications. ° °CALL THE OFFICE FOR: °• Temperature above 101 degrees °• Excessive bleeding or drainage on the dressing. °• Excessive swelling, coldness, or paleness of the toes. °• Persistent  nausea and vomiting. ° °FOLLOW-UP:  °• You should have an appointment to return to the office in 10-14 days after surgery. °• Arrangements have been made for continuation of Physical Therapy (either home therapy or outpatient therapy). °  °

## 2019-01-07 ENCOUNTER — Encounter
Admission: RE | Admit: 2019-01-07 | Discharge: 2019-01-07 | Disposition: A | Discharge status: Home / Self Care | Payer: BLUE CROSS/BLUE SHIELD | Source: Ambulatory Visit | Attending: Orthopedic Surgery | Admitting: Orthopedic Surgery

## 2019-01-07 ENCOUNTER — Other Ambulatory Visit: Payer: Self-pay

## 2019-01-07 DIAGNOSIS — Z01812 Encounter for preprocedural laboratory examination: Secondary | ICD-10-CM | POA: Insufficient documentation

## 2019-01-07 HISTORY — DX: Unspecified osteoarthritis, unspecified site: M19.90

## 2019-01-07 HISTORY — DX: Acute embolism and thrombosis of unspecified deep veins of unspecified lower extremity: I82.409

## 2019-01-07 HISTORY — DX: Other pulmonary embolism without acute cor pulmonale: I26.99

## 2019-01-07 LAB — COMPREHENSIVE METABOLIC PANEL
ALT: 15 U/L (ref 0–44)
AST: 22 U/L (ref 15–41)
Albumin: 3.8 g/dL (ref 3.5–5.0)
Alkaline Phosphatase: 84 U/L (ref 38–126)
Anion gap: 4 — ABNORMAL LOW (ref 5–15)
BUN: 16 mg/dL (ref 6–20)
CHLORIDE: 106 mmol/L (ref 98–111)
CO2: 29 mmol/L (ref 22–32)
CREATININE: 0.77 mg/dL (ref 0.44–1.00)
Calcium: 9.1 mg/dL (ref 8.9–10.3)
GFR calc Af Amer: >60 mL/min (ref >60)
GFR calc non Af Amer: >60 mL/min (ref >60)
Glucose, Bld: 87 mg/dL (ref 70–99)
Potassium: 4 mmol/L (ref 3.5–5.1)
Sodium: 139 mmol/L (ref 135–145)
Total Bilirubin: 0.6 mg/dL (ref 0.3–1.2)
Total Protein: 7.1 g/dL (ref 6.5–8.1)

## 2019-01-07 LAB — URINALYSIS, ROUTINE W REFLEX MICROSCOPIC
Bilirubin Urine: NEGATIVE
Glucose, UA: NEGATIVE mg/dL
Hgb urine dipstick: NEGATIVE
Ketones, ur: NEGATIVE mg/dL
Leukocytes, UA: NEGATIVE
Nitrite: NEGATIVE
Protein, ur: NEGATIVE mg/dL
Specific Gravity, Urine: 1.013 (ref 1.005–1.030)
pH: 6 (ref 5.0–8.0)

## 2019-01-07 LAB — CBC
HCT: 37.6 % (ref 36.0–46.0)
Hemoglobin: 11.8 g/dL — ABNORMAL LOW (ref 12.0–15.0)
MCH: 29.4 pg (ref 26.0–34.0)
MCHC: 31.4 g/dL (ref 30.0–36.0)
MCV: 93.5 fL (ref 80.0–100.0)
Platelets: 279 10*3/uL (ref 150–400)
RBC: 4.02 MIL/uL (ref 3.87–5.11)
RDW: 14.3 % (ref 11.5–15.5)
WBC: 5.1 10*3/uL (ref 4.0–10.5)
nRBC: 0 % (ref 0.0–0.2)

## 2019-01-07 LAB — SURGICAL PCR SCREEN
MRSA, PCR: NEGATIVE
Staphylococcus aureus: NEGATIVE

## 2019-01-07 LAB — TYPE AND SCREEN
ABO/RH(D): A POS
Antibody Screen: NEGATIVE

## 2019-01-07 LAB — APTT: aPTT: 31 seconds (ref 24–36)

## 2019-01-07 LAB — SEDIMENTATION RATE: SED RATE: 20 mm/h (ref 0–30)

## 2019-01-07 LAB — C-REACTIVE PROTEIN: CRP: <0.8 mg/dL (ref <1.0)

## 2019-01-07 LAB — PROTIME-INR
INR: 0.99
Prothrombin Time: 13 seconds (ref 11.4–15.2)

## 2019-01-07 LAB — HEMOGLOBIN A1C
Hgb A1c MFr Bld: 6.1 % — ABNORMAL HIGH (ref 4.8–5.6)
Mean Plasma Glucose: 128.37 mg/dL

## 2019-01-07 NOTE — Pre-Procedure Instructions (Signed)
Erica Powers at Dr [facility] office notified of TXA order and pts hx of dvt , pulmonary embolism. Order dcd. She will make Dr [name] aware.

## 2019-01-07 NOTE — Pre-Procedure Instructions (Signed)
Abnormal HgbA1c faxed to Dr Elenor Legato office

## 2019-01-07 NOTE — Pre-Procedure Instructions (Signed)
Phone message left with Tiffany at Dr [employer] office. Note in chart states pt needs to see Dr [name] or Schnier to evaluate for IVC filter due to history of pulmonary embolism. Pt says she not heard any about this.

## 2019-01-07 NOTE — Patient Instructions (Signed)
Your procedure is scheduled on: January 21, 2019 [facility] Report to Day Surgery on the 2nd floor of [facility]. To find out your arrival time, please call [phone] between 1PM - 3PM on: Tuesday January 20, 2019  REMEMBER: Instructions that are not followed completely may result in serious medical risk, up to and including death; or upon the discretion of your surgeon and anesthesiologist your surgery may need to be rescheduled.  Do not eat food after midnight the night before surgery.  No gum chewing, lozengers or hard candies.  You may however, drink CLEAR liquids up to 2 hours before you are scheduled to arrive for your surgery. Do not drink anything within 2 hours of the start of your surgery.  Clear liquids include: - water  - apple juice without pulp - CLEAR gatorade - black coffee or tea (Do NOT add milk or creamers to the coffee or tea) Do NOT drink anything that is not on this list.  Type 1 and Type 2 diabetics should only drink water.  No Alcohol for 24 hours before or after surgery.  No Smoking including e-cigarettes for 24 hours prior to surgery.  No chewable tobacco products for at least 6 hours prior to surgery.  No nicotine patches on the day of surgery.  On the morning of surgery brush your teeth with toothpaste and water, you may rinse your mouth with mouthwash if you wish. Do not swallow any toothpaste or mouthwash.  Notify your doctor if there is any change in your medical condition (cold, fever, infection).  Do not wear jewelry, make-up, hairpins, clips or nail polish.  Do not wear lotions, powders, or perfumes NOT DEODORANT   Do not shave 48 hours prior to surgery.   Contacts and dentures may not be worn into surgery.  Do not bring valuables to the hospital, including drivers license, insurance or credit cards.  [facility] is not responsible for any belongings or valuables.   TAKE THESE MEDICATIONS THE MORNING OF SURGERY: NONE  Use CHG  Soap or wipes as directed on instruction sheet.  Use inhalers on the day of surgery   Follow recommendations from Cardiologist, Pulmonologist or PCP regarding stopping Aspirin, Coumadin, Plavix, Eliquis, Pradaxa, or Pletal.  Stop Anti-inflammatories (NSAIDS) such as Advil, Aleve, Ibuprofen, Motrin, Naproxen, Naprosyn and Aspirin based products such as Excedrin, Goodys Powder, BC Powder. (May take Tylenol or Acetaminophen if needed.)  Stop ANY OVER THE COUNTER supplements until after surgery. (May continue Vitamin D, Vitamin B, and multivitamin.)  Wear comfortable clothing (specific to your surgery type) to the hospital.  Plan for stool softeners for home use.  If you are being admitted to the hospital overnight, leave your suitcase in the car. After surgery it may be brought to your room.  If you are being discharged the day of surgery, you will not be allowed to drive home. You will need a responsible adult to drive you home and stay with you that night.   If you are taking public transportation, you will need to have a responsible adult with you. Please confirm with your physician that it is acceptable to use public transportation.   Please call [phone] if you have any questions about these instructions.

## 2019-01-08 LAB — URINE CULTURE
Culture: NO GROWTH
Special Requests: NORMAL

## 2019-01-13 ENCOUNTER — Other Ambulatory Visit: Payer: Self-pay

## 2019-01-13 ENCOUNTER — Encounter (INDEPENDENT_AMBULATORY_CARE_PROVIDER_SITE_OTHER): Payer: Self-pay | Admitting: Vascular Surgery

## 2019-01-13 ENCOUNTER — Ambulatory Visit (INDEPENDENT_AMBULATORY_CARE_PROVIDER_SITE_OTHER): Payer: BLUE CROSS/BLUE SHIELD | Admitting: Vascular Surgery

## 2019-01-13 ENCOUNTER — Telehealth (INDEPENDENT_AMBULATORY_CARE_PROVIDER_SITE_OTHER): Payer: Self-pay

## 2019-01-13 ENCOUNTER — Encounter (INDEPENDENT_AMBULATORY_CARE_PROVIDER_SITE_OTHER): Payer: Self-pay

## 2019-01-13 VITALS — BP 147/90 | HR 73 | Resp 10 | Ht 63.0 in | Wt 206.0 lb

## 2019-01-13 DIAGNOSIS — R600 Localized edema: Secondary | ICD-10-CM

## 2019-01-13 DIAGNOSIS — M1711 Unilateral primary osteoarthritis, right knee: Secondary | ICD-10-CM | POA: Diagnosis not present

## 2019-01-13 DIAGNOSIS — I2699 Other pulmonary embolism without acute cor pulmonale: Secondary | ICD-10-CM

## 2019-01-13 DIAGNOSIS — E119 Type 2 diabetes mellitus without complications: Secondary | ICD-10-CM | POA: Diagnosis not present

## 2019-01-13 DIAGNOSIS — M7989 Other specified soft tissue disorders: Secondary | ICD-10-CM

## 2019-01-13 NOTE — Assessment & Plan Note (Signed)
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  Scheduled for right knee replacement next week.  She is high risk for recurrent thromboembolic events and as such an IVC filter was recommended by her orthopedic surgeon.  I would agree that she has high risk and the placement of an IVC filter and subsequent removal 2 to 3 months after knee replacement would be an appropriate measure to reduce her risk of pulmonary embolus.  I discussed that this would not reduce the risk of DVT, and perioperative DVT prophylaxis should be continued as normal.  I have discussed the procedure of placing the filter as well as the removal.  The patient is agreement and would like to have a filter prior to her knee replacement.  This will be scheduled for next week.

## 2019-01-13 NOTE — Assessment & Plan Note (Signed)
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  May also have some lymphedema.  Symptoms have been reasonably stable.

## 2019-01-13 NOTE — Assessment & Plan Note (Signed)
Scheduled for right knee replacement next week.  She is high risk for recurrent thromboembolic events and as such an IVC filter was recommended by her orthopedic surgeon.  I would agree that she has high risk and the placement of an IVC filter and subsequent removal 2 to 3 months after knee replacement would be an appropriate measure to reduce her risk of pulmonary embolus.  I discussed that this would not reduce the risk of DVT, and perioperative DVT prophylaxis should be continued as normal.  I have discussed the procedure of placing the filter as well as the removal.  The patient is agreement and would like to have a filter prior to her knee replacement.  This will be scheduled for next week.

## 2019-01-13 NOTE — Patient Instructions (Signed)
Inferior Vena Cava Filter Insertion Insertion of an inferior vena cava (IVC) filter is a procedure in which a filter is placed into the large vein in your abdomen that carries blood from the lower part of your body to your heart (inferior vena cava). This filter helps to prevent blood clots in the legs or pelvis from traveling to your lungs, which can be very dangerous. The filter is a small, metal device that is shaped like the spokes of an umbrella. The filter is inserted through a pathway that is created in the neck or groin. Filters are inserted when blood thinners (anticoagulants) cannot be used to prevent blood clots from forming. You may need filters rather than anticoagulants because you have:  Severe platelet problems or shortages.  Recent or current major bleeding that cannot be treated.  Bleeding associated with anticoagulants.  Recurrence of blood clots while taking anticoagulants.  A need for surgery in the near future.  Bleeding in your head.  Multiple broken bones. Tell a health care provider about:  Any allergies you have, including iodine.  All medicines you are taking, including vitamins, herbs, eye drops, creams, and over-the-counter medicines.  Any blood disorders you have.  Any problems you or family members have had with anesthetic medicines.  Any medical conditions you have.  Any surgeries you have had.  Whether you are pregnant or may be pregnant. What are the risks? Generally, this is a safe procedure. However, problems may occur, including:  The filter blocking the inferior vena cava. This can cause leg swelling.  The filter eventually failing and not working properly.  The filter moving and traveling to the heart or lungs.  Damage to the vein. This is rare.  Bleeding.  Allergic reactions to medicines or dyes.  Damage to other structures or organs.  Infection.  A pool of blood (hematoma) around the site where a flexible tube is put into a  large vein (catheter insertion site). What happens before the procedure? Staying hydrated Follow instructions from your health care provider about hydration, which may include:  Up to 2 hours before the procedure - you may continue to drink clear liquids, such as water, clear fruit juice, black coffee, and plain tea. Eating and drinking restrictions Follow instructions from your health care provider about eating and drinking, which may include:  8 hours before the procedure - stop eating heavy meals or foods such as meat, fried foods, or fatty foods.  6 hours before the procedure - stop eating light meals or foods, such as toast or cereal.  6 hours before the procedure - stop drinking milk or drinks that contain milk.  2 hours before the procedure - stop drinking clear liquids. Medicines   Ask your health care provider about: ? Changing or stopping your regular medicines. This is especially important if you are taking diabetes medicines or blood thinners. ? Taking medicines such as aspirin and ibuprofen. These medicines can thin your blood. Do not take these medicines before your procedure if your health care provider instructs you not to.  You may be given antibiotic medicine to help prevent infection. General instructions  Ask your health care provider how your surgical site will be marked or identified.  You may have blood tests. These tests can help to tell how well your kidneys and liver are working. They can also show how fast your blood is clotting.  You may be asked to shower with a germ-killing soap.  Plan to have someone take you   home from the hospital or clinic.  If you will be going home right after the procedure, plan to have someone with you for 24 hours.  Do not use any products that contain nicotine or tobacco, such as cigarettes and e-cigarettes. If you need help quitting, ask your health care provider. What happens during the procedure?  To lower your risk of  infection: ? Your health care team will wash or sanitize their hands. ? Your skin will be washed with soap. ? Hair may be removed from the surgical area.  An IV tube will be inserted into one of your veins.  You will be given one or more of the following: ? A medicine to help you relax (sedative). ? A medicine to numb the area (local anesthetic).  The procedure is done through a large vein in your neck or groin. A small cut (incision) will be made in this area.  A flexible tube (catheter) will be put into a large vein where the incision was made.  Contrast dye may be injected into the inferior vena cava to help guide the catheter.  X-rays may be used to make sure that the catheter is in the correct position.  The IVC filter will be inserted into the vein through the catheter until it reaches the correct location in the inferior vena cava.  The catheter will be removed.  Pressure will be applied to the insertion site to stop bleeding.  A bandage (dressing) may be applied over the catheter insertion site.  Your IV tube will be taken out. The procedure may vary among health care providers and hospitals. What happens after the procedure?  Your blood pressure, heart rate, breathing rate, and blood oxygen level will be monitored until the medicines you were given have worn off.  Your insertion site will be monitored for the first few hours for any signs of bleeding.  Do not drive for 24 hours if you were given a sedative. Summary  The inferior vena cava filter helps to prevent blood clots in the legs or pelvis from traveling to your lungs.  The IVC filter is inserted when anticoagulants cannot be used to prevent blood clots.  Plan to have someone take you home from the hospital or clinic, and plan to have someone with you for 24 hours if you will be going home right after the procedure. This information is not intended to replace advice given to you by your health care provider.  Make sure you discuss any questions you have with your health care provider. Document Released: 01/09/2006 Document Revised: 10/10/2016 Document Reviewed: 10/10/2016 [employer] Interactive Patient Education  2019 [employer]  

## 2019-01-13 NOTE — Progress Notes (Signed)
MRN : [id number]  Erica Powers is a 58 y.o. ([birth year]) female who presents with chief complaint of  Chief Complaint  Patient presents with  . Follow-up  .  History of Present Illness: Patient returns today in follow up on request of her orthopedic surgeon.  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  She has severe arthritis in her right knee and has an upcoming knee replacement for next week.  Given her previous history of DVT and pulmonary embolus, it was requested by her orthopedic surgeon that she consider an IVC filter placement.  She has had some postphlebitic issues that we have seen her for in the past.  She reports swelling which is reasonably stable.  Her pain now is largely from her arthritis.  No recent chest pain or shortness of breath.  No fever or chills.  Current Outpatient Medications  Medication Sig Dispense Refill  . acetaminophen (TYLENOL) 500 MG tablet Take 500 mg by mouth every 6 (six) hours as needed.    Marland Kitchen albuterol (PROVENTIL HFA;VENTOLIN HFA) 108 (90 Base) MCG/ACT inhaler Inhale 1-2 puffs into the lungs every 4 (four) hours as needed for wheezing or shortness of breath.     . Calcium Carbonate-Vitamin D (CALTRATE 600+D) 600-400 MG-UNIT tablet Take 1 tablet by mouth daily.     . diclofenac sodium (VOLTAREN) 1 % GEL Apply 1 application topically 3 (three) times daily as needed (knee pain).    . Multiple Vitamins-Minerals (MULTIVITAMIN WITH MINERALS) tablet Take 1 tablet by mouth daily.     No current facility-administered medications for this visit.     Past Medical History:  Diagnosis Date  . Arthritis   . Asthma   . Difficult intubation    pt denies  . DVT, lower extremity (HCC)    right leg that went to lung  . GERD (gastroesophageal reflux disease)   . History of hiatal hernia   . Pulmonary embolism [facility])     Past Surgical History:  Procedure Laterality Date  . BREAST BIOPSY Bilateral    bilat bx  done neg at [facility]   . BREAST SURGERY  2000   Reduction  . CHOLECYSTECTOMY    . COLONOSCOPY N/A 04/08/2015   Procedure: COLONOSCOPY;  Surgeon: Midge Minium, MD;  Location[facility];  Service: Gastroenterology;  Laterality: N/A;  . ESOPHAGOGASTRODUODENOSCOPY (EGD) WITH PROPOFOL N/A 07/07/2015   Procedure: ESOPHAGOGASTRODUODENOSCOPY (EGD) WITH PROPOFOL;  Surgeon: Midge Minium, MD;  Location: [facility];  Service: Endoscopy;  Laterality: N/A;  . GASTRIC ROUX-EN-Y N/A 11/01/2015   Procedure: LAPAROSCOPIC ROUX-EN-Y GASTRIC, HIATAL HERNIA REPAIR, EXTENSIVE LYSIS OF ADHESIONS;  Surgeon: Everette Rank, MD;  Location: [facility] ORS;  Service: General;  Laterality: N/A;  . HEEL SPUR SURGERY Bilateral   . REDUCTION MAMMAPLASTY Bilateral 2000  . ROTATOR CUFF REPAIR Right 2009  . TONSILLECTOMY    . TUBAL LIGATION     Family History  Problem Relation Age of Onset  . Diabetes Mother   . Hypertension Mother   No bleeding disorders, clotting disorders, or aneurysms  Social History       Social History   Substance Use Topics   . Smoking status: Never Smoker   . Smokeless tobacco: Never Used   . Alcohol use 0.6 oz/week    1 Glasses of wine per week  No IV drug use      Allergies  Allergen Reactions  . Codeine Itching  .  Hydrocodone Itching      REVIEW OF SYSTEMS (Negative unless checked)  Constitutional: [x] ?Weight loss  [] ?Fever  [] ?Chills Cardiac: [] ?Chest pain   [] ?Chest pressure   [] ?Palpitations   [] ?Shortness of breath when laying flat   [] ?Shortness of breath at rest   [] ?Shortness of breath with exertion. Vascular:  [] ?Pain in legs with walking   [] ?Pain in legs at rest   [] ?Pain in legs when laying flat   [] ?Claudication   [] ?Pain in feet when walking  [] ?Pain in feet at rest  [] ?Pain in feet when laying flat   [x] ?History of DVT   [x] ?Phlebitis   [x] ?Swelling in legs   [x] ?Varicose veins   [] ?Non-healing ulcers Pulmonary:   [] ?Uses home oxygen   [] ?Productive  cough   [] ?Hemoptysis   [] ?Wheeze  [] ?COPD   [] ?Asthma Neurologic:  [] ?Dizziness  [] ?Blackouts   [] ?Seizures   [] ?History of stroke   [] ?History of TIA  [] ?Aphasia   [] ?Temporary blindness   [] ?Dysphagia   [] ?Weakness or numbness in arms   [] ?Weakness or numbness in legs Musculoskeletal:  [x] ?Arthritis   [] ?Joint swelling   [x] ?Joint pain   [] ?Low back pain Hematologic:  [] ?Easy bruising  [] ?Easy bleeding   [] ?Hypercoagulable state   [] ?Anemic  [] ?Hepatitis Gastrointestinal:  [] ?Blood in stool   [] ?Vomiting blood  [] ?Gastroesophageal reflux/heartburn   [] ?Abdominal pain Genitourinary:  [] ?Chronic kidney disease   [] ?Difficult urination  [] ?Frequent urination  [] ?Burning with urination   [] ?Hematuria Skin:  [] ?Rashes   [] ?Ulcers   [] ?Wounds Psychological:  [] ?History of anxiety   [] ? History of major depression.     Physical Examination  BP (!) 147/90 (BP Location: Left Arm, Patient Position: Sitting, Cuff Size: Small)   Pulse 73   Resp 10   Ht 5\' 3"  (1.6 m)   Wt 206 lb (93.4 kg)   BMI 36.49 kg/m  Gen:  WD/WN, NAD Head: [place]/AT, No temporalis wasting. Ear/Nose/Throat: Hearing grossly intact, nares w/o erythema or drainage Eyes: Conjunctiva clear. Sclera non-icteric Neck: Supple.  Trachea midline Pulmonary:  Good air movement, no use of accessory muscles.  Cardiac: RRR, no JVD Vascular:  Vessel Right Left  Radial Palpable Palpable                          PT Palpable Palpable  DP Palpable Palpable   Gastrointestinal: soft, non-tender/non-distended. No guarding/reflex.  Musculoskeletal: M/S 5/5 throughout.  No deformity or atrophy.  Mild bilateral lower extremity edema a little worse on the right than the left. Neurologic: Sensation grossly intact in extremities.  Symmetrical.  Speech is fluent.  Psychiatric: Judgment intact, Mood & affect appropriate for pt's clinical situation. Dermatologic: No rashes or ulcers noted.  No cellulitis or open wounds.       Labs Recent  Results (from the past 2160 hour(s))  Troponin I - ONCE - STAT     Status: None   Collection Time: 10/31/18  7:53 PM  Result Value Ref Range   Troponin I <0.03 <0.03 ng/mL    Comment: Performed at [facility], [address] [place], [place] [address]  CBC with Differential     Status: None   Collection Time: 10/31/18  7:53 PM  Result Value Ref Range   WBC 8.1 4.0 - 10.5 K/uL   RBC 4.04 3.87 - 5.11 MIL/uL   Hemoglobin 12.0 12.0 - 15.0 g/dL   HCT 60.4 54.0 - 98.1 %   MCV 95.5 80.0 - 100.0 fL   MCH  29.7 26.0 - 34.0 pg   MCHC 31.1 30.0 - 36.0 g/dL   RDW 16.113.7 09.611.5 - 04.515.5 %   Platelets 279 150 - 400 K/uL   nRBC 0.0 0.0 - 0.2 %   Neutrophils Relative % 59 %   Neutro Abs 4.7 1.7 - 7.7 K/uL   Lymphocytes Relative 34 %   Lymphs Abs 2.7 0.7 - 4.0 K/uL   Monocytes Relative 7 %   Monocytes Absolute 0.6 0.1 - 1.0 K/uL   Eosinophils Relative 0 %   Eosinophils Absolute 0.0 0.0 - 0.5 K/uL   Basophils Relative 0 %   Basophils Absolute 0.0 0.0 - 0.1 K/uL   Immature Granulocytes 0 %   Abs Immature Granulocytes 0.02 0.00 - 0.07 K/uL    Comment: Performed at [facility], [address] [place], [place] [address]  APTT     Status: None   Collection Time: 10/31/18  7:53 PM  Result Value Ref Range   aPTT 31 24 - 36 seconds    Comment: Performed at [facility], [address] [place], [place] [address]  Basic metabolic panel     Status: Abnormal   Collection Time: 10/31/18  7:53 PM  Result Value Ref Range   Sodium 140 135 - 145 mmol/L   Potassium 4.4 3.5 - 5.1 mmol/L   Chloride 105 98 - 111 mmol/L   CO2 29 22 - 32 mmol/L   Glucose, Bld 105 (H) 70 - 99 mg/dL   BUN 19 6 - 20 mg/dL   Creatinine, Ser 8.290.78 0.44 - 1.00 mg/dL   Calcium 8.8 (L) 8.9 - 10.3 mg/dL   GFR calc non Af Amer >60 >60 mL/min   GFR calc Af Amer >60 >60 mL/min   Anion gap 6 5 - 15    Comment: Performed at [facility], [address] [place], [place] [address]  Surgical pcr  screen     Status: None   Collection Time: 01/07/19  8:44 AM  Result Value Ref Range   MRSA, PCR NEGATIVE NEGATIVE   Staphylococcus aureus NEGATIVE NEGATIVE    Comment: (NOTE) The Xpert SA Assay (FDA approved for NASAL specimens in patients 58 years of age and older), is one component of a comprehensive surveillance program. It is not intended to diagnose infection nor to guide or monitor treatment. Performed at [facility], [address] [place], [place] [address]   Urinalysis, Routine w reflex microscopic     Status: Abnormal   Collection Time: 01/07/19  8:44 AM  Result Value Ref Range   Color, Urine STRAW (A) YELLOW   APPearance CLEAR (A) CLEAR   Specific Gravity, Urine 1.013 1.005 - 1.030   pH 6.0 5.0 - 8.0   Glucose, UA NEGATIVE NEGATIVE mg/dL   Hgb urine dipstick NEGATIVE NEGATIVE   Bilirubin Urine NEGATIVE NEGATIVE   Ketones, ur NEGATIVE NEGATIVE mg/dL   Protein, ur NEGATIVE NEGATIVE mg/dL   Nitrite NEGATIVE NEGATIVE   Leukocytes, UA NEGATIVE NEGATIVE    Comment: Performed at [facility], [address] [place], [place] [address]  Urine culture     Status: None   Collection Time: 01/07/19  8:44 AM  Result Value Ref Range   Specimen Description      URINE, RANDOM Performed at [facility], [address] [place], [place] [address]    Special Requests      Normal Performed at [facility], [address] [place], [place] [address]  Culture      NO GROWTH Performed at [facility], [address] [place], [place] [address]    Report Status 01/08/2019 FINAL   C-reactive protein     Status: None   Collection Time: 01/07/19  8:46 AM  Result Value Ref Range   CRP <0.8 <1.0 mg/dL    Comment: Performed at [facility], [address], [place], [place] [address]  Hemoglobin A1c     Status: Abnormal   Collection Time: 01/07/19  8:46 AM  Result Value Ref Range   Hgb A1c MFr Bld 6.1 (H) 4.8 - 5.6 %     Comment: (NOTE) Pre diabetes:          5.7%-6.4% Diabetes:              >6.4% Glycemic control for   <7.0% adults with diabetes    Mean Plasma Glucose 128.37 mg/dL    Comment: Performed at [facility], [address], [place], [place] [address]  Sedimentation rate     Status: None   Collection Time: 01/07/19  8:46 AM  Result Value Ref Range   Sed Rate 20 0 - 30 mm/hr    Comment: Performed at [facility], [address] [place], [place] [address]  APTT     Status: None   Collection Time: 01/07/19  8:46 AM  Result Value Ref Range   aPTT 31 24 - 36 seconds    Comment: Performed at [facility], [address] [place], [place] [address]  CBC     Status: Abnormal   Collection Time: 01/07/19  8:46 AM  Result Value Ref Range   WBC 5.1 4.0 - 10.5 K/uL   RBC 4.02 3.87 - 5.11 MIL/uL   Hemoglobin 11.8 (L) 12.0 - 15.0 g/dL   HCT 52.8 41.3 - 24.4 %   MCV 93.5 80.0 - 100.0 fL   MCH 29.4 26.0 - 34.0 pg   MCHC 31.4 30.0 - 36.0 g/dL   RDW 01.0 27.2 - 53.6 %   Platelets 279 150 - 400 K/uL   nRBC 0.0 0.0 - 0.2 %    Comment: Performed at [facility], [address] [place], [place] [address]  Comprehensive metabolic panel     Status: Abnormal   Collection Time: 01/07/19  8:46 AM  Result Value Ref Range   Sodium 139 135 - 145 mmol/L   Potassium 4.0 3.5 - 5.1 mmol/L   Chloride 106 98 - 111 mmol/L   CO2 29 22 - 32 mmol/L   Glucose, Bld 87 70 - 99 mg/dL   BUN 16 6 - 20 mg/dL   Creatinine, Ser 4.74 0.44 - 1.00 mg/dL   Calcium 9.1 8.9 - 25.9 mg/dL   Total Protein 7.1 6.5 - 8.1 g/dL   Albumin 3.8 3.5 - 5.0 g/dL   AST 22 15 - 41 U/L   ALT 15 0 - 44 U/L   Alkaline Phosphatase 84 38 - 126 U/L   Total Bilirubin 0.6 0.3 - 1.2 mg/dL   GFR calc non Af Amer >60 >60 mL/min   GFR calc Af Amer >60 >60 mL/min   Anion gap 4 (L) 5 - 15    Comment: Performed at [facility], [address] [place], [place] [address]  Protime-INR     Status: None    Collection Time: 01/07/19  8:46 AM  Result Value Ref Range   Prothrombin Time 13.0 11.4 - 15.2 seconds  INR 0.99     Comment: Performed at [facility], [address] [place], [place] [address]  Type and screen Order type and screen if day of surgery is less than 15 days from draw of preadmission visit or order morning of surgery if day of surgery is greater than 6 days from preadmission visit.     Status: None   Collection Time: 01/07/19  8:46 AM  Result Value Ref Range   ABO/RH(D) A POS    Antibody Screen NEG    Sample Expiration 01/21/2019    Extend sample reason      NO TRANSFUSIONS OR PREGNANCY IN THE PAST 3 MONTHS Performed at [facility], [address] [place], [place] [address]     Radiology No results found.  Assessment/Plan Diabetes mellitus, type 2 blood glucose control important in reducing the progression of atherosclerotic disease. Also, involved in wound healing. On appropriate medications.   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  May also have some lymphedema.  Symptoms have been reasonably stable.  Arthritis, degenerative Scheduled for right knee replacement next week.  She is high risk for recurrent thromboembolic events and as such an IVC filter was recommended by her orthopedic surgeon.  I would agree that she has high risk and the placement of an IVC filter and subsequent removal 2 to 3 months after knee replacement would be an appropriate measure to reduce her risk of pulmonary embolus.  I discussed that this would not reduce the risk of DVT, and perioperative DVT prophylaxis should be continued as normal.  I have discussed the procedure of placing the filter as well as the removal.  The patient is agreement and would like to have a filter prior to her knee replacement.  This will be scheduled for next week.  Pulmonary emboli (HCC) Previous history of DVT and pulmonary embolus  after previous surgery.  Scheduled for right knee replacement next week.  She is high risk for recurrent thromboembolic events and as such an IVC filter was recommended by her orthopedic surgeon.  I would agree that she has high risk and the placement of an IVC filter and subsequent removal 2 to 3 months after knee replacement would be an appropriate measure to reduce her risk of pulmonary embolus.  I discussed that this would not reduce the risk of DVT, and perioperative DVT prophylaxis should be continued as normal.  I have discussed the procedure of placing the filter as well as the removal.  The patient is agreement and would like to have a filter prior to her knee replacement.  This will be scheduled for next week.    Festus BarrenJason Chesley Veasey, MD  01/13/2019 3:43 PM    This note was created with Dragon medical transcription system.  Any errors from dictation are purely unintentional

## 2019-01-13 NOTE — Telephone Encounter (Signed)
Spoke with the patient and gave her the paperwork for her procedure on 01/19/2019 as well as the arrival time of 10:30 am.

## 2019-01-16 ENCOUNTER — Other Ambulatory Visit (INDEPENDENT_AMBULATORY_CARE_PROVIDER_SITE_OTHER): Payer: Self-pay | Admitting: Nurse Practitioner

## 2019-01-19 ENCOUNTER — Other Ambulatory Visit: Payer: Self-pay

## 2019-01-19 ENCOUNTER — Ambulatory Visit
Admission: RE | Admit: 2019-01-19 | Discharge: 2019-01-19 | Disposition: A | Discharge status: Home / Self Care | Payer: BLUE CROSS/BLUE SHIELD | Attending: Vascular Surgery | Admitting: Vascular Surgery

## 2019-01-19 ENCOUNTER — Encounter
Admission: RE | Disposition: A | Discharge status: Home / Self Care | Payer: Self-pay | Source: Home / Self Care | Attending: Vascular Surgery

## 2019-01-19 DIAGNOSIS — I2699 Other pulmonary embolism without acute cor pulmonale: Secondary | ICD-10-CM

## 2019-01-19 DIAGNOSIS — E119 Type 2 diabetes mellitus without complications: Secondary | ICD-10-CM | POA: Insufficient documentation

## 2019-01-19 DIAGNOSIS — M7989 Other specified soft tissue disorders: Secondary | ICD-10-CM | POA: Insufficient documentation

## 2019-01-19 DIAGNOSIS — M1711 Unilateral primary osteoarthritis, right knee: Secondary | ICD-10-CM | POA: Diagnosis not present

## 2019-01-19 DIAGNOSIS — Z86711 Personal history of pulmonary embolism: Secondary | ICD-10-CM | POA: Diagnosis present

## 2019-01-19 DIAGNOSIS — Z86718 Personal history of other venous thrombosis and embolism: Secondary | ICD-10-CM | POA: Insufficient documentation

## 2019-01-19 HISTORY — PX: IVC FILTER INSERTION: CATH118245

## 2019-01-19 SURGERY — IVC FILTER INSERTION
Anesthesia: Moderate Sedation

## 2019-01-19 MED ORDER — FENTANYL CITRATE (PF) 100 MCG/2ML IJ SOLN
INTRAMUSCULAR | Status: DC | PRN
  Administered 2019-01-19: 50 ug via INTRAVENOUS
  Administered 2019-01-19: 25 ug via INTRAVENOUS

## 2019-01-19 MED ORDER — CEFAZOLIN SODIUM-DEXTROSE 1-4 GM/50ML-% IV SOLN
1.0000 g | Freq: Once | INTRAVENOUS | Status: AC
  Administered 2019-01-19: 1 g via INTRAVENOUS

## 2019-01-19 MED ORDER — MIDAZOLAM HCL 2 MG/ML PO SYRP: 8.0000 mg | ORAL_SOLUTION | Freq: Once | ORAL | Status: DC | PRN

## 2019-01-19 MED ORDER — LIDOCAINE-EPINEPHRINE (PF) 1 %-1:200000 IJ SOLN
INTRAMUSCULAR | Status: AC
  Filled 2019-01-19: qty 30

## 2019-01-19 MED ORDER — HYDROMORPHONE HCL 1 MG/ML IJ SOLN: 1.0000 mg | Freq: Once | INTRAMUSCULAR | Status: DC | PRN

## 2019-01-19 MED ORDER — FENTANYL CITRATE (PF) 100 MCG/2ML IJ SOLN
INTRAMUSCULAR | Status: AC
  Filled 2019-01-19: qty 2

## 2019-01-19 MED ORDER — MIDAZOLAM HCL 2 MG/2ML IJ SOLN
INTRAMUSCULAR | Status: DC | PRN
  Administered 2019-01-19: 2 mg via INTRAVENOUS
  Administered 2019-01-19: 1 mg via INTRAVENOUS

## 2019-01-19 MED ORDER — SODIUM CHLORIDE FLUSH 0.9 % IV SOLN
INTRAVENOUS | Status: AC
  Filled 2019-01-19: qty 60

## 2019-01-19 MED ORDER — MIDAZOLAM HCL 5 MG/5ML IJ SOLN
INTRAMUSCULAR | Status: AC
  Filled 2019-01-19: qty 5

## 2019-01-19 MED ORDER — HEPARIN (PORCINE) IN NACL 1000-0.9 UT/500ML-% IV SOLN
INTRAVENOUS | Status: AC
  Filled 2019-01-19: qty 500

## 2019-01-19 MED ORDER — FAMOTIDINE 20 MG PO TABS: 40.0000 mg | ORAL_TABLET | Freq: Once | ORAL | Status: DC | PRN

## 2019-01-19 MED ORDER — ONDANSETRON HCL 4 MG/2ML IJ SOLN: 4.0000 mg | Freq: Four times a day (QID) | INTRAMUSCULAR | Status: DC | PRN

## 2019-01-19 MED ORDER — SODIUM CHLORIDE 0.9 % IV SOLN: INTRAVENOUS | Status: DC

## 2019-01-19 MED ORDER — DIPHENHYDRAMINE HCL 50 MG/ML IJ SOLN: 50.0000 mg | Freq: Once | INTRAMUSCULAR | Status: DC | PRN

## 2019-01-19 MED ORDER — METHYLPREDNISOLONE SODIUM SUCC 125 MG IJ SOLR: 125.0000 mg | Freq: Once | INTRAMUSCULAR | Status: DC | PRN

## 2019-01-19 SURGICAL SUPPLY — 3 items
FILTER VC CELECT-FEMORAL (Filter) ×2 IMPLANT
PACK ANGIOGRAPHY (CUSTOM PROCEDURE TRAY) ×2 IMPLANT
WIRE J 3MM .035X145CM (WIRE) ×2 IMPLANT

## 2019-01-19 NOTE — H&P (Signed)
[facility] History & Physical Update  The patient was interviewed and re-examined.  The patient's previous History and Physical has been reviewed and is unchanged.  There is no change in the plan of care. We plan to proceed with the scheduled procedure.  Festus Barren, MD  01/19/2019, 10:30 AM

## 2019-01-19 NOTE — Op Note (Signed)
[name]   OPERATIVE NOTE    PRE-OPERATIVE DIAGNOSIS: History of DVT and PE with upcoming knee replacement surgery this week  POST-OPERATIVE DIAGNOSIS: same as above  PROCEDURE: 1.   Ultrasound guidance for vascular access to the right femoral vein 2.   Catheter placement into the inferior vena cava 3.   Inferior venacavogram 4.   Placement of a Cook Celect IVC filter  SURGEON: Festus Barren, MD  ASSISTANT(S): None  ANESTHESIA: local with Moderate Conscious Sedation for approximately 15 minutes using 3 mg of Versed and 75 mcg of Fentanyl  ESTIMATED BLOOD LOSS: minimal  CONTRAST: 10 cc  FLUORO TIME: less than one minute  FINDING(S): 1.  Patent IVC  SPECIMEN(S):  none  INDICATIONS:   Erica Powers is a 58 y.o. female who presents with an upcoming knee surgery with a previous history of DVT and PE.  Her orthopedic surgeon was very concerned about thromboembolic complications around the time of her knee replacement.  Inferior vena cava filter is indicated for this reason.  Risks and benefits including filter thrombosis, migration, fracture, bleeding, and infection were all discussed.  We discussed that all IVC filters that we place can be removed if desired from the patient once the need for the filter has passed.    DESCRIPTION: After obtaining full informed written consent, the patient was brought back to the vascular suite. The skin was sterilely prepped and draped in a sterile surgical field was created. Moderate conscious sedation was administered during a face to face encounter with the patient throughout the procedure with my supervision of the RN administering medicines and monitoring the patient's vital signs, pulse oximetry, telemetry and mental status throughout from the start of the procedure until the patient was taken to the recovery room. The right femoral was accessed under direct ultrasound guidance without difficulty with a Seldinger needle  and a J-wire was then placed. After skin nick and dilatation, the delivery sheath was placed into the inferior vena cava and an inferior venacavogram was performed. This demonstrated a patent IVC with the level of the renal veins at L1.  The filter was then deployed into the inferior vena cava at the level of L2 just below the renal veins. The delivery sheath was then removed. Pressure was held. Sterile dressings were placed. The patient tolerated the procedure well and was taken to the recovery room in stable condition.  COMPLICATIONS: None  CONDITION: Stable  Festus Barren  01/19/2019, 12:20 PM   This note was created with Dragon Medical transcription system. Any errors in dictation are purely unintentional.

## 2019-01-20 ENCOUNTER — Telehealth (INDEPENDENT_AMBULATORY_CARE_PROVIDER_SITE_OTHER): Payer: Self-pay | Admitting: Vascular Surgery

## 2019-01-20 ENCOUNTER — Encounter: Payer: Self-pay | Admitting: Vascular Surgery

## 2019-01-20 MED ORDER — CEFAZOLIN SODIUM-DEXTROSE 2-4 GM/100ML-% IV SOLN: 2.0000 g | INTRAVENOUS | Status: DC

## 2019-01-20 NOTE — Telephone Encounter (Signed)
Patient had a filter placed yesterday with Dr. [name], if [facility] clinic needs something written, they would need to fax over a clearance sheet. Thank you.

## 2019-01-21 ENCOUNTER — Ambulatory Visit: Payer: BLUE CROSS/BLUE SHIELD | Admitting: Certified Registered Nurse Anesthetist

## 2019-01-21 ENCOUNTER — Inpatient Hospital Stay: Payer: BLUE CROSS/BLUE SHIELD

## 2019-01-21 ENCOUNTER — Encounter: Payer: Self-pay | Admitting: Orthopedic Surgery

## 2019-01-21 ENCOUNTER — Other Ambulatory Visit: Payer: Self-pay

## 2019-01-21 ENCOUNTER — Encounter
Admission: RE | Disposition: A | Discharge status: Home Health | Payer: Self-pay | Source: Home / Self Care | Attending: Orthopedic Surgery

## 2019-01-21 ENCOUNTER — Inpatient Hospital Stay
Admission: RE | Admit: 2019-01-21 | Discharge: 2019-01-23 | DRG: 470 | Disposition: A | Discharge status: Home Health | Payer: BLUE CROSS/BLUE SHIELD | Attending: Orthopedic Surgery | Admitting: Orthopedic Surgery

## 2019-01-21 DIAGNOSIS — Z8261 Family history of arthritis: Secondary | ICD-10-CM | POA: Diagnosis not present

## 2019-01-21 DIAGNOSIS — Z833 Family history of diabetes mellitus: Secondary | ICD-10-CM

## 2019-01-21 DIAGNOSIS — Z8249 Family history of ischemic heart disease and other diseases of the circulatory system: Secondary | ICD-10-CM

## 2019-01-21 DIAGNOSIS — Z885 Allergy status to narcotic agent status: Secondary | ICD-10-CM | POA: Diagnosis not present

## 2019-01-21 DIAGNOSIS — Z6836 Body mass index (BMI) 36.0-36.9, adult: Secondary | ICD-10-CM | POA: Diagnosis not present

## 2019-01-21 DIAGNOSIS — K219 Gastro-esophageal reflux disease without esophagitis: Secondary | ICD-10-CM | POA: Diagnosis present

## 2019-01-21 DIAGNOSIS — Z95828 Presence of other vascular implants and grafts: Secondary | ICD-10-CM

## 2019-01-21 DIAGNOSIS — M1711 Unilateral primary osteoarthritis, right knee: Secondary | ICD-10-CM | POA: Diagnosis present

## 2019-01-21 DIAGNOSIS — J45909 Unspecified asthma, uncomplicated: Secondary | ICD-10-CM | POA: Diagnosis present

## 2019-01-21 DIAGNOSIS — Z9884 Bariatric surgery status: Secondary | ICD-10-CM

## 2019-01-21 DIAGNOSIS — E669 Obesity, unspecified: Secondary | ICD-10-CM | POA: Diagnosis present

## 2019-01-21 DIAGNOSIS — Z79899 Other long term (current) drug therapy: Secondary | ICD-10-CM | POA: Diagnosis not present

## 2019-01-21 DIAGNOSIS — Z86711 Personal history of pulmonary embolism: Secondary | ICD-10-CM

## 2019-01-21 DIAGNOSIS — Z96652 Presence of left artificial knee joint: Secondary | ICD-10-CM

## 2019-01-21 DIAGNOSIS — Z8042 Family history of malignant neoplasm of prostate: Secondary | ICD-10-CM | POA: Diagnosis not present

## 2019-01-21 DIAGNOSIS — Z96659 Presence of unspecified artificial knee joint: Secondary | ICD-10-CM

## 2019-01-21 HISTORY — PX: TOTAL KNEE ARTHROPLASTY: SHX125

## 2019-01-21 LAB — ABO/RH: ABO/RH(D): A POS

## 2019-01-21 SURGERY — ARTHROPLASTY, KNEE, TOTAL
Anesthesia: Spinal | Site: Knee | Laterality: Right

## 2019-01-21 MED ORDER — BUPIVACAINE HCL (PF) 0.5 % IJ SOLN
INTRAMUSCULAR | Status: DC | PRN
  Administered 2019-01-21: 2.6 mL

## 2019-01-21 MED ORDER — DEXAMETHASONE SODIUM PHOSPHATE 10 MG/ML IJ SOLN
8.0000 mg | Freq: Once | INTRAMUSCULAR | Status: AC
  Administered 2019-01-21: 8 mg via INTRAVENOUS

## 2019-01-21 MED ORDER — FENTANYL CITRATE (PF) 100 MCG/2ML IJ SOLN
INTRAMUSCULAR | Status: AC
  Filled 2019-01-21: qty 2

## 2019-01-21 MED ORDER — SODIUM CHLORIDE 0.9 % IV SOLN
INTRAVENOUS | Status: DC
  Administered 2019-01-21 – 2019-01-22 (×3): via INTRAVENOUS

## 2019-01-21 MED ORDER — FENTANYL CITRATE (PF) 100 MCG/2ML IJ SOLN
INTRAMUSCULAR | Status: AC
  Administered 2019-01-21: 25 ug via INTRAVENOUS
  Filled 2019-01-21: qty 2

## 2019-01-21 MED ORDER — CELECOXIB 200 MG PO CAPS
ORAL_CAPSULE | ORAL | Status: AC
  Filled 2019-01-21: qty 2

## 2019-01-21 MED ORDER — LIDOCAINE HCL (PF) 2 % IJ SOLN
INTRAMUSCULAR | Status: AC
  Filled 2019-01-21: qty 10

## 2019-01-21 MED ORDER — GABAPENTIN 300 MG PO CAPS
ORAL_CAPSULE | ORAL | Status: AC
  Filled 2019-01-21: qty 1

## 2019-01-21 MED ORDER — ACETAMINOPHEN 10 MG/ML IV SOLN
1000.0000 mg | Freq: Four times a day (QID) | INTRAVENOUS | Status: AC
  Administered 2019-01-21 – 2019-01-22 (×3): 1000 mg via INTRAVENOUS
  Filled 2019-01-21 (×3): qty 100

## 2019-01-21 MED ORDER — CYCLOBENZAPRINE HCL 10 MG PO TABS: 5.0000 mg | ORAL_TABLET | Freq: Every day | ORAL | Status: DC | PRN

## 2019-01-21 MED ORDER — MIDAZOLAM HCL 5 MG/5ML IJ SOLN
INTRAMUSCULAR | Status: DC | PRN
  Administered 2019-01-21: 2 mg via INTRAVENOUS

## 2019-01-21 MED ORDER — PROPOFOL 500 MG/50ML IV EMUL
INTRAVENOUS | Status: AC
  Filled 2019-01-21: qty 50

## 2019-01-21 MED ORDER — FAMOTIDINE 20 MG PO TABS
20.0000 mg | ORAL_TABLET | Freq: Once | ORAL | Status: AC
  Administered 2019-01-21: 20 mg via ORAL

## 2019-01-21 MED ORDER — MENTHOL 3 MG MT LOZG
1.0000 | LOZENGE | OROMUCOSAL | Status: DC | PRN
  Filled 2019-01-21: qty 9

## 2019-01-21 MED ORDER — BUPIVACAINE HCL (PF) 0.25 % IJ SOLN
INTRAMUSCULAR | Status: AC
  Filled 2019-01-21: qty 60

## 2019-01-21 MED ORDER — CEFAZOLIN SODIUM-DEXTROSE 2-4 GM/100ML-% IV SOLN
2.0000 g | Freq: Four times a day (QID) | INTRAVENOUS | Status: AC
  Administered 2019-01-21 – 2019-01-22 (×4): 2 g via INTRAVENOUS
  Filled 2019-01-21 (×4): qty 100

## 2019-01-21 MED ORDER — ADULT MULTIVITAMIN W/MINERALS CH
1.0000 | ORAL_TABLET | Freq: Every day | ORAL | Status: DC
  Administered 2019-01-22 – 2019-01-23 (×2): 1 via ORAL
  Filled 2019-01-21 (×2): qty 1

## 2019-01-21 MED ORDER — CELECOXIB 200 MG PO CAPS
200.0000 mg | ORAL_CAPSULE | Freq: Two times a day (BID) | ORAL | Status: DC
  Administered 2019-01-21 – 2019-01-23 (×4): 200 mg via ORAL
  Filled 2019-01-21 (×4): qty 1

## 2019-01-21 MED ORDER — SODIUM CHLORIDE 0.9 % IV SOLN
INTRAVENOUS | Status: DC | PRN
  Administered 2019-01-21: 30 ug/min via INTRAVENOUS

## 2019-01-21 MED ORDER — PROPOFOL 500 MG/50ML IV EMUL
INTRAVENOUS | Status: DC | PRN
  Administered 2019-01-21: 70 ug/kg/min via INTRAVENOUS

## 2019-01-21 MED ORDER — ONDANSETRON HCL 4 MG/2ML IJ SOLN: 4.0000 mg | Freq: Four times a day (QID) | INTRAMUSCULAR | Status: DC | PRN

## 2019-01-21 MED ORDER — PHENOL 1.4 % MT LIQD
1.0000 | OROMUCOSAL | Status: DC | PRN
  Filled 2019-01-21: qty 177

## 2019-01-21 MED ORDER — GABAPENTIN 300 MG PO CAPS
300.0000 mg | ORAL_CAPSULE | Freq: Every day | ORAL | Status: DC
  Administered 2019-01-21 – 2019-01-22 (×2): 300 mg via ORAL
  Filled 2019-01-21 (×2): qty 1

## 2019-01-21 MED ORDER — FLEET ENEMA 7-19 GM/118ML RE ENEM: 1.0000 | ENEMA | Freq: Once | RECTAL | Status: DC | PRN

## 2019-01-21 MED ORDER — CELECOXIB 200 MG PO CAPS
400.0000 mg | ORAL_CAPSULE | Freq: Once | ORAL | Status: AC
  Administered 2019-01-21: 400 mg via ORAL

## 2019-01-21 MED ORDER — FERROUS SULFATE 325 (65 FE) MG PO TABS
325.0000 mg | ORAL_TABLET | Freq: Two times a day (BID) | ORAL | Status: DC
  Administered 2019-01-22 – 2019-01-23 (×2): 325 mg via ORAL
  Filled 2019-01-21 (×2): qty 1

## 2019-01-21 MED ORDER — METOCLOPRAMIDE HCL 10 MG PO TABS
10.0000 mg | ORAL_TABLET | Freq: Three times a day (TID) | ORAL | Status: DC
  Administered 2019-01-21 – 2019-01-22 (×3): 10 mg via ORAL
  Filled 2019-01-21 (×3): qty 1

## 2019-01-21 MED ORDER — CEFAZOLIN SODIUM 1 G IJ SOLR
INTRAMUSCULAR | Status: AC
  Filled 2019-01-21: qty 20

## 2019-01-21 MED ORDER — OXYCODONE HCL 5 MG PO TABS
5.0000 mg | ORAL_TABLET | ORAL | Status: DC | PRN
  Administered 2019-01-21 – 2019-01-23 (×9): 5 mg via ORAL
  Filled 2019-01-21 (×9): qty 1

## 2019-01-21 MED ORDER — CEFAZOLIN SODIUM-DEXTROSE 2-4 GM/100ML-% IV SOLN
INTRAVENOUS | Status: AC
  Filled 2019-01-21: qty 100

## 2019-01-21 MED ORDER — ALUM & MAG HYDROXIDE-SIMETH 200-200-20 MG/5ML PO SUSP: 30.0000 mL | ORAL | Status: DC | PRN

## 2019-01-21 MED ORDER — FAMOTIDINE 20 MG PO TABS
ORAL_TABLET | ORAL | Status: AC
  Filled 2019-01-21: qty 1

## 2019-01-21 MED ORDER — HYDROMORPHONE HCL 1 MG/ML IJ SOLN: 0.5000 mg | INTRAMUSCULAR | Status: DC | PRN

## 2019-01-21 MED ORDER — SODIUM CHLORIDE 0.9 % IV SOLN
INTRAVENOUS | Status: DC | PRN
  Administered 2019-01-21: 60 mL

## 2019-01-21 MED ORDER — LABETALOL HCL 5 MG/ML IV SOLN
INTRAVENOUS | Status: AC
  Filled 2019-01-21: qty 4

## 2019-01-21 MED ORDER — GLYCOPYRROLATE 0.2 MG/ML IJ SOLN
INTRAMUSCULAR | Status: DC | PRN
  Administered 2019-01-21: 0.2 mg via INTRAVENOUS

## 2019-01-21 MED ORDER — ENOXAPARIN SODIUM 30 MG/0.3ML ~~LOC~~ SOLN
30.0000 mg | Freq: Two times a day (BID) | SUBCUTANEOUS | Status: DC
  Administered 2019-01-22 – 2019-01-23 (×3): 30 mg via SUBCUTANEOUS
  Filled 2019-01-21 (×3): qty 0.3

## 2019-01-21 MED ORDER — KETAMINE HCL 50 MG/ML IJ SOLN
INTRAMUSCULAR | Status: AC
  Filled 2019-01-21: qty 10

## 2019-01-21 MED ORDER — METOCLOPRAMIDE HCL 10 MG PO TABS: 5.0000 mg | ORAL_TABLET | Freq: Three times a day (TID) | ORAL | Status: DC | PRN

## 2019-01-21 MED ORDER — PROPOFOL 10 MG/ML IV BOLUS
INTRAVENOUS | Status: DC | PRN
  Administered 2019-01-21: 36 mg via INTRAVENOUS

## 2019-01-21 MED ORDER — SENNOSIDES-DOCUSATE SODIUM 8.6-50 MG PO TABS
1.0000 | ORAL_TABLET | Freq: Two times a day (BID) | ORAL | Status: DC
  Administered 2019-01-21 – 2019-01-23 (×4): 1 via ORAL
  Filled 2019-01-21 (×4): qty 1

## 2019-01-21 MED ORDER — ACETAMINOPHEN 325 MG PO TABS: 325.0000 mg | ORAL_TABLET | Freq: Four times a day (QID) | ORAL | Status: DC | PRN

## 2019-01-21 MED ORDER — TRAMADOL HCL 50 MG PO TABS
50.0000 mg | ORAL_TABLET | ORAL | Status: DC | PRN
  Administered 2019-01-21 – 2019-01-22 (×2): 100 mg via ORAL
  Filled 2019-01-21 (×2): qty 2

## 2019-01-21 MED ORDER — MAGNESIUM HYDROXIDE 400 MG/5ML PO SUSP
30.0000 mL | Freq: Every day | ORAL | Status: DC
  Administered 2019-01-22: 30 mL via ORAL
  Filled 2019-01-21: qty 30

## 2019-01-21 MED ORDER — SODIUM CHLORIDE 0.9 % IV SOLN
INTRAVENOUS | Status: DC | PRN
  Administered 2019-01-21: 3500 mL

## 2019-01-21 MED ORDER — METOCLOPRAMIDE HCL 5 MG/ML IJ SOLN: 5.0000 mg | Freq: Three times a day (TID) | INTRAMUSCULAR | Status: DC | PRN

## 2019-01-21 MED ORDER — SODIUM CHLORIDE FLUSH 0.9 % IV SOLN
INTRAVENOUS | Status: AC
  Filled 2019-01-21: qty 40

## 2019-01-21 MED ORDER — CALCIUM CARBONATE-VITAMIN D 500-200 MG-UNIT PO TABS
1.0000 | ORAL_TABLET | Freq: Every day | ORAL | Status: DC
  Administered 2019-01-22 – 2019-01-23 (×2): 1 via ORAL
  Filled 2019-01-21 (×2): qty 1

## 2019-01-21 MED ORDER — ONDANSETRON HCL 4 MG/2ML IJ SOLN: 4.0000 mg | Freq: Once | INTRAMUSCULAR | Status: DC | PRN

## 2019-01-21 MED ORDER — BUPIVACAINE HCL (PF) 0.25 % IJ SOLN
INTRAMUSCULAR | Status: DC | PRN
  Administered 2019-01-21: 60 mL

## 2019-01-21 MED ORDER — LACTATED RINGERS IV SOLN
INTRAVENOUS | Status: DC
  Administered 2019-01-21 (×2): via INTRAVENOUS

## 2019-01-21 MED ORDER — MOMETASONE FUROATE 0.1 % EX SOLN: 4.0000 [drp] | Freq: Every evening | CUTANEOUS | Status: DC | PRN

## 2019-01-21 MED ORDER — KETAMINE HCL 50 MG/ML IJ SOLN
INTRAMUSCULAR | Status: DC | PRN
  Administered 2019-01-21 (×4): 25 mg via INTRAMUSCULAR

## 2019-01-21 MED ORDER — CEFAZOLIN SODIUM-DEXTROSE 2-3 GM-%(50ML) IV SOLR
INTRAVENOUS | Status: DC | PRN
  Administered 2019-01-21 (×2): 2 g via INTRAVENOUS

## 2019-01-21 MED ORDER — DIPHENHYDRAMINE HCL 12.5 MG/5ML PO ELIX: 12.5000 mg | ORAL_SOLUTION | ORAL | Status: DC | PRN

## 2019-01-21 MED ORDER — GENTAMICIN SULFATE 40 MG/ML IJ SOLN
INTRAMUSCULAR | Status: AC
  Filled 2019-01-21: qty 8

## 2019-01-21 MED ORDER — LIDOCAINE HCL (CARDIAC) PF 100 MG/5ML IV SOSY
PREFILLED_SYRINGE | INTRAVENOUS | Status: DC | PRN
  Administered 2019-01-21: 100 mg via INTRAVENOUS

## 2019-01-21 MED ORDER — LABETALOL HCL 5 MG/ML IV SOLN
INTRAVENOUS | Status: DC | PRN
  Administered 2019-01-21 (×2): 20 mg via INTRAVENOUS

## 2019-01-21 MED ORDER — BISACODYL 10 MG RE SUPP
10.0000 mg | Freq: Every day | RECTAL | Status: DC | PRN
  Filled 2019-01-21: qty 1

## 2019-01-21 MED ORDER — ONDANSETRON HCL 4 MG PO TABS: 4.0000 mg | ORAL_TABLET | Freq: Four times a day (QID) | ORAL | Status: DC | PRN

## 2019-01-21 MED ORDER — TETRACAINE HCL 1 % IJ SOLN
INTRAMUSCULAR | Status: DC | PRN
  Administered 2019-01-21: 4 mg via INTRASPINAL

## 2019-01-21 MED ORDER — ALBUTEROL SULFATE (2.5 MG/3ML) 0.083% IN NEBU: 3.0000 mL | INHALATION_SOLUTION | RESPIRATORY_TRACT | Status: DC | PRN

## 2019-01-21 MED ORDER — ACETAMINOPHEN 10 MG/ML IV SOLN
INTRAVENOUS | Status: DC | PRN
  Administered 2019-01-21: 1000 mg via INTRAVENOUS

## 2019-01-21 MED ORDER — DEXAMETHASONE SODIUM PHOSPHATE 10 MG/ML IJ SOLN
INTRAMUSCULAR | Status: AC
  Filled 2019-01-21: qty 1

## 2019-01-21 MED ORDER — MIDAZOLAM HCL 2 MG/2ML IJ SOLN
INTRAMUSCULAR | Status: AC
  Filled 2019-01-21: qty 2

## 2019-01-21 MED ORDER — GABAPENTIN 300 MG PO CAPS
300.0000 mg | ORAL_CAPSULE | Freq: Once | ORAL | Status: AC
  Administered 2019-01-21: 300 mg via ORAL

## 2019-01-21 MED ORDER — FENTANYL CITRATE (PF) 100 MCG/2ML IJ SOLN
25.0000 ug | INTRAMUSCULAR | Status: AC | PRN
  Administered 2019-01-21 (×6): 25 ug via INTRAVENOUS

## 2019-01-21 MED ORDER — PANTOPRAZOLE SODIUM 40 MG PO TBEC
40.0000 mg | DELAYED_RELEASE_TABLET | Freq: Two times a day (BID) | ORAL | Status: DC
  Administered 2019-01-21 – 2019-01-23 (×4): 40 mg via ORAL
  Filled 2019-01-21 (×4): qty 1

## 2019-01-21 MED ORDER — OXYCODONE HCL 5 MG PO TABS: 10.0000 mg | ORAL_TABLET | ORAL | Status: DC | PRN

## 2019-01-21 MED ORDER — BUPIVACAINE LIPOSOME 1.3 % IJ SUSP
INTRAMUSCULAR | Status: AC
  Filled 2019-01-21: qty 20

## 2019-01-21 MED ORDER — GLYCOPYRROLATE 0.2 MG/ML IJ SOLN
INTRAMUSCULAR | Status: AC
  Filled 2019-01-21: qty 1

## 2019-01-21 MED ORDER — FENTANYL CITRATE (PF) 100 MCG/2ML IJ SOLN
INTRAMUSCULAR | Status: DC | PRN
  Administered 2019-01-21: 75 ug via INTRAVENOUS
  Administered 2019-01-21: 25 ug via INTRAVENOUS

## 2019-01-21 MED ORDER — FENTANYL CITRATE (PF) 100 MCG/2ML IJ SOLN
25.0000 ug | INTRAMUSCULAR | Status: AC | PRN
  Administered 2019-01-21 (×2): 25 ug via INTRAVENOUS

## 2019-01-21 MED ORDER — ACETAMINOPHEN 10 MG/ML IV SOLN
INTRAVENOUS | Status: AC
  Filled 2019-01-21: qty 100

## 2019-01-21 MED ORDER — CHLORHEXIDINE GLUCONATE 4 % EX LIQD: 60.0000 mL | Freq: Once | CUTANEOUS | Status: DC

## 2019-01-21 SURGICAL SUPPLY — 74 items
ATTUNE MED DOME PAT 41 KNEE (Knees) ×1 IMPLANT
ATTUNE MED DOME PAT 41MM KNEE (Knees) ×1 IMPLANT
ATTUNE PS FEM RT SZ 7 CEM KNEE (Femur) ×2 IMPLANT
ATTUNE PSRP INSR SZ7 5 KNEE (Insert) ×1 IMPLANT
ATTUNE PSRP INSR SZ7 5MM KNEE (Insert) ×1 IMPLANT
BASE TIBIAL ROT PLAT SZ 7 KNEE (Knees) IMPLANT
BATTERY INSTRU NAVIGATION (MISCELLANEOUS) ×12 IMPLANT
BLADE CLIPPER SURG (BLADE) ×2 IMPLANT
BLADE SAW 70X12.5 (BLADE) ×3 IMPLANT
BLADE SAW 90X13X1.19 OSCILLAT (BLADE) ×3 IMPLANT
BLADE SAW 90X25X1.19 OSCILLAT (BLADE) ×3 IMPLANT
BSPLAT TIB 7 CMNT ROT PLAT STR (Knees) ×1 IMPLANT
BTRY SRG DRVR LF (MISCELLANEOUS) ×4
CANISTER SUCT 1200ML W/VALVE (MISCELLANEOUS) ×3 IMPLANT
CANISTER SUCT 3000ML PPV (MISCELLANEOUS) ×6 IMPLANT
CEMENT HV SMART SET (Cement) ×6 IMPLANT
COOLER POLAR GLACIER W/PUMP (MISCELLANEOUS) ×3 IMPLANT
COVER WAND RF STERILE (DRAPES) ×3 IMPLANT
CUFF TOURN 24 STER (MISCELLANEOUS) IMPLANT
CUFF TOURN 30 STER DUAL PORT (MISCELLANEOUS) ×2 IMPLANT
DRAPE SHEET LG 3/4 BI-LAMINATE (DRAPES) ×3 IMPLANT
DRSG DERMACEA 8X12 NADH (GAUZE/BANDAGES/DRESSINGS) ×3 IMPLANT
DRSG OPSITE POSTOP 4X14 (GAUZE/BANDAGES/DRESSINGS) ×3 IMPLANT
DRSG TEGADERM 4X4.75 (GAUZE/BANDAGES/DRESSINGS) ×3 IMPLANT
DURAPREP 26ML APPLICATOR (WOUND CARE) ×6 IMPLANT
ELECT CAUTERY BLADE 6.4 (BLADE) ×3 IMPLANT
ELECT REM PT RETURN 9FT ADLT (ELECTROSURGICAL) ×3
ELECTRODE REM PT RTRN 9FT ADLT (ELECTROSURGICAL) ×1 IMPLANT
EX-PIN ORTHOLOCK NAV 4X150 (PIN) ×6 IMPLANT
GLOVE BIOGEL M STRL SZ7.5 (GLOVE) ×6 IMPLANT
GLOVE INDICATOR 8.0 STRL GRN (GLOVE) ×3 IMPLANT
GOWN STRL REUS W/ TWL LRG LVL3 (GOWN DISPOSABLE) ×2 IMPLANT
GOWN STRL REUS W/TWL LRG LVL3 (GOWN DISPOSABLE) ×6
HEMOVAC 400CC 10FR (MISCELLANEOUS) ×3 IMPLANT
HOLDER FOLEY CATH W/STRAP (MISCELLANEOUS) ×3 IMPLANT
HOOD PEEL AWAY FLYTE STAYCOOL (MISCELLANEOUS) ×6 IMPLANT
KIT TURNOVER KIT A (KITS) ×3 IMPLANT
KNIFE SCULPS 14X20 (INSTRUMENTS) ×3 IMPLANT
LABEL OR SOLS (LABEL) ×3 IMPLANT
MANIFOLD NEPTUNE WASTE (CANNULA) ×3 IMPLANT
NDL SAFETY ECLIPSE 18X1.5 (NEEDLE) ×1 IMPLANT
NDL SPNL 20GX3.5 QUINCKE YW (NEEDLE) ×2 IMPLANT
NEEDLE HYPO 18GX1.5 SHARP (NEEDLE) ×3
NEEDLE SPNL 20GX3.5 QUINCKE YW (NEEDLE) ×6 IMPLANT
NS IRRIG 500ML POUR BTL (IV SOLUTION) ×3 IMPLANT
PACK TOTAL KNEE (MISCELLANEOUS) ×3 IMPLANT
PAD WRAPON POLAR KNEE (MISCELLANEOUS) ×1 IMPLANT
PENCIL SMOKE ULTRAEVAC 22 CON (MISCELLANEOUS) ×3 IMPLANT
PIN DRILL QUICK PACK ×3 IMPLANT
PIN FIXATION 1/8DIA X 3INL (PIN) ×9 IMPLANT
PIN STEINMAN FIXATION KNEE (PIN) ×2 IMPLANT
PULSAVAC PLUS IRRIG FAN TIP (DISPOSABLE) ×3
SOL .9 NS 3000ML IRR  AL (IV SOLUTION) ×2
SOL .9 NS 3000ML IRR AL (IV SOLUTION) ×1
SOL .9 NS 3000ML IRR UROMATIC (IV SOLUTION) ×1 IMPLANT
SOL PREP PVP 2OZ (MISCELLANEOUS) ×3
SOLUTION PREP PVP 2OZ (MISCELLANEOUS) ×1 IMPLANT
SPONGE DRAIN TRACH 4X4 STRL 2S (GAUZE/BANDAGES/DRESSINGS) ×3 IMPLANT
STAPLER SKIN PROX 35W (STAPLE) ×3 IMPLANT
STOCKINETTE IMPERV 14X48 (MISCELLANEOUS) IMPLANT
STRAP TIBIA SHORT (MISCELLANEOUS) ×3 IMPLANT
SUCTION FRAZIER HANDLE 10FR (MISCELLANEOUS) ×2
SUCTION TUBE FRAZIER 10FR DISP (MISCELLANEOUS) ×1 IMPLANT
SUT VIC AB 0 CT1 36 (SUTURE) ×3 IMPLANT
SUT VIC AB 1 CT1 36 (SUTURE) ×6 IMPLANT
SUT VIC AB 2-0 CT2 27 (SUTURE) ×3 IMPLANT
SYR 20CC LL (SYRINGE) ×3 IMPLANT
SYR 30ML LL (SYRINGE) ×6 IMPLANT
TIBIAL BASE ROT PLAT SZ 7 KNEE (Knees) ×3 IMPLANT
TIP FAN IRRIG PULSAVAC PLUS (DISPOSABLE) ×1 IMPLANT
TOWEL OR 17X26 4PK STRL BLUE (TOWEL DISPOSABLE) ×3 IMPLANT
TOWER CARTRIDGE SMART MIX (DISPOSABLE) ×3 IMPLANT
TRAY FOLEY MTR SLVR 16FR STAT (SET/KITS/TRAYS/PACK) ×3 IMPLANT
WRAPON POLAR PAD KNEE (MISCELLANEOUS) ×3

## 2019-01-21 NOTE — Anesthesia Postprocedure Evaluation (Signed)
Anesthesia Post Note  Patient: Erica Powers  Procedure(s) Performed: TOTAL KNEE ARTHROPLASTY with navigation (Right Knee)  Patient location during evaluation: PACU Anesthesia Type: Spinal Level of consciousness: awake and alert Pain management: pain level controlled Vital Signs Assessment: post-procedure vital signs reviewed and stable Respiratory status: spontaneous breathing, nonlabored ventilation, respiratory function stable and patient connected to nasal cannula oxygen Cardiovascular status: blood pressure returned to baseline and stable Postop Assessment: no apparent nausea or vomiting Anesthetic complications: no     Last Vitals:  Vitals:   01/21/19 1611 01/21/19 1616  BP: 110/72   Pulse: (!) 53 (!) 56  Resp: 20 12  Temp:    SpO2: 100% 97%    Last Pain:  Vitals:   01/21/19 1616  TempSrc:   PainSc: 5                  Yevette Edwards

## 2019-01-21 NOTE — H&P (Signed)
The patient has been re-examined, and the chart reviewed, and there have been no interval changes to the documented history and physical.    The risks, benefits, and alternatives have been discussed at length. The patient expressed understanding of the risks benefits and agreed with plans for surgical intervention.  Demerius Podolak P. Arden Axon, Jr. M.D.    

## 2019-01-21 NOTE — Transfer of Care (Signed)
Immediate Anesthesia Transfer of Care Note  Patient: Erica Powers  Procedure(s) Performed: TOTAL KNEE ARTHROPLASTY with navigation (Right Knee)  Patient Location: PACU  Anesthesia Type:Spinal  Level of Consciousness: awake, alert  and oriented  Airway & Oxygen Therapy: Patient Spontanous Breathing and Patient connected to nasal cannula oxygen  Post-op Assessment: Report given to RN and Post -op Vital signs reviewed and stable  Post vital signs: Reviewed and stable  Last Vitals:  Vitals Value Taken Time  BP 111/73 01/21/2019  3:41 PM  Temp 37.1 C 01/21/2019  3:41 PM  Pulse 65 01/21/2019  3:41 PM  Resp 16 01/21/2019  3:41 PM  SpO2 100 % 01/21/2019  3:41 PM    Last Pain:  Vitals:   01/21/19 0957  TempSrc: Oral  PainSc: 0-No pain         Complications: No apparent anesthesia complications

## 2019-01-21 NOTE — Op Note (Signed)
OPERATIVE NOTE  DATE OF SURGERY:  01/21/2019  PATIENT NAME:  Erica Powers   DOB: [birth year]  MRN: [id number]  PRE-OPERATIVE DIAGNOSIS: Degenerative arthrosis of the right knee, primary  POST-OPERATIVE DIAGNOSIS:  Same  PROCEDURE:  Right total knee arthroplasty using computer-assisted navigation  SURGEON:  Jena Gauss. M.D.  ASSISTANT: Anabel Halon, PA-C (present and scrubbed throughout the case, critical for assistance with exposure, retraction, instrumentation, and closure)  ANESTHESIA: spinal  ESTIMATED BLOOD LOSS: 50 mL  FLUIDS REPLACED: 1300 mL of crystalloid  TOURNIQUET TIME: 133 minutes  DRAINS: 2 medium Hemovac drains  SOFT TISSUE RELEASES: Anterior cruciate ligament, posterior cruciate ligament, deep medial collateral ligament, patellofemoral ligament, posterolateral corner, and pie crusting of the IT band  IMPLANTS UTILIZED: DePuy Attune size 7 posterior stabilized femoral component (cemented), size 7 rotating platform tibial component (cemented), 41 mm medialized dome patella (cemented), and a 5 mm stabilized rotating platform polyethylene insert.  INDICATIONS FOR SURGERY: Erica Powers is a 58 y.o. year old female with a long history of progressive knee pain. X-rays demonstrated severe degenerative changes in tricompartmental fashion. The patient had not seen any significant improvement despite conservative nonsurgical intervention. After discussion of the risks and benefits of surgical intervention, the patient expressed understanding of the risks benefits and agree with plans for total knee arthroplasty.   The risks, benefits, and alternatives were discussed at length including but not limited to the risks of infection, bleeding, nerve injury, stiffness, blood clots, the need for revision surgery, cardiopulmonary complications, among others, and they were willing to proceed.  PROCEDURE IN DETAIL: The patient was brought into the operating room and, after  adequate spinal anesthesia was achieved, a tourniquet was placed on the patient's upper thigh. The patient's knee and leg were cleaned and prepped with alcohol and DuraPrep and draped in the usual sterile fashion. A "timeout" was performed as per usual protocol. The lower extremity was exsanguinated using an Esmarch, and the tourniquet was inflated to 300 mmHg. An anterior longitudinal incision was made followed by a standard mid vastus approach. The deep fibers of the medial collateral ligament were elevated in a subperiosteal fashion off of the medial flare of the tibia so as to maintain a continuous soft tissue sleeve. The patella was subluxed laterally and the patellofemoral ligament was incised. Inspection of the knee demonstrated severe degenerative changes with full-thickness loss of articular cartilage. Osteophytes were debrided using a rongeur. Anterior and posterior cruciate ligaments were excised. Two 4.0 mm Schanz pins were inserted in the femur and into the tibia for attachment of the array of trackers used for computer-assisted navigation. Hip center was identified using a circumduction technique. Distal landmarks were mapped using the computer. The distal femur and proximal tibia were mapped using the computer. The distal femoral cutting guide was positioned using computer-assisted navigation so as to achieve a 5 distal valgus cut. The femur was sized and it was felt that a size 7 femoral component was appropriate. A size 7 femoral cutting guide was positioned and the anterior cut was performed and verified using the computer. This was followed by completion of the posterior and chamfer cuts. Femoral cutting guide for the central box was then positioned in the center box cut was performed.  Attention was then directed to the proximal tibia. Medial and lateral menisci were excised. The extramedullary tibial cutting guide was positioned using computer-assisted navigation so as to achieve a 0  varus-valgus alignment and 3 posterior slope. The cut was performed  and verified using the computer. The proximal tibia was sized and it was felt that a size 7 tibial tray was appropriate. Tibial and femoral trials were inserted followed by insertion of a 5 mm polyethylene insert. The knee was felt to be tight laterally.  The trial components were removed and the knee was brought into full extension and distracted using the Moreland retractors.  The posterolateral corner was carefully released using a combination of electrocautery and Metzenbaum scissors.  The knee was still tight laterally.  The IT band was pie crusted using a scalpel.  Trial components were reinserted followed by placement of a 5 mm polyethylene trial.  This allowed for excellent mediolateral soft tissue balancing both in flexion and in full extension. Finally, the patella was cut and prepared so as to accommodate a 41 mm medialized dome patella. A patella trial was placed and the knee was placed through a range of motion with excellent patellar tracking appreciated. The femoral trial was removed after debridement of posterior osteophytes. The central post-hole for the tibial component was reamed followed by insertion of a keel punch. Tibial trials were then removed. Cut surfaces of bone were irrigated with copious amounts of normal saline with antibiotic solution using pulsatile lavage and then suctioned dry. Polymethylmethacrylate cement with gentamicin was prepared in the usual fashion using a vacuum mixer. Cement was applied to the cut surface of the proximal tibia as well as along the undersurface of a size 7 rotating platform tibial component. Tibial component was positioned and impacted into place. Excess cement was removed using Personal assistant. Cement was then applied to the cut surfaces of the femur as well as along the posterior flanges of the size 7 femoral component. The femoral component was positioned and impacted into place.  Excess cement was removed using Personal assistant. A 5 mm polyethylene trial was inserted and the knee was brought into full extension with steady axial compression applied. Finally, cement was applied to the backside of a 41 mm medialized dome patella and the patellar component was positioned and patellar clamp applied. Excess cement was removed using Personal assistant. After adequate curing of the cement, the tourniquet was deflated after a total tourniquet time of 133 minutes. Hemostasis was achieved using electrocautery. The knee was irrigated with copious amounts of normal saline with antibiotic solution using pulsatile lavage and then suctioned dry. 20 mL of 1.3% Exparel and 60 mL of 0.25% Marcaine in 40 mL of normal saline was injected along the posterior capsule, medial and lateral gutters, and along the arthrotomy site. A 5 mm stabilized rotating platform polyethylene insert was inserted and the knee was placed through a range of motion with excellent mediolateral soft tissue balancing appreciated and excellent patellar tracking noted. 2 medium drains were placed in the wound bed and brought out through separate stab incisions. The medial parapatellar portion of the incision was reapproximated using interrupted sutures of #1 Vicryl. Subcutaneous tissue was approximated in layers using first #0 Vicryl followed #2-0 Vicryl. The skin was approximated with skin staples. A sterile dressing was applied.  The patient tolerated the procedure well and was transported to the recovery room in stable condition.     P. Angie Fava., M.D.

## 2019-01-21 NOTE — Anesthesia Procedure Notes (Signed)
Spinal  Patient location during procedure: OR Start time: 01/21/2019 11:23 AM End time: 01/21/2019 11:28 AM Staffing Resident/CRNA: Bernardo Heater, CRNA Performed: resident/CRNA  Preanesthetic Checklist Completed: patient identified, site marked, surgical consent, pre-op evaluation, timeout performed, IV checked, risks and benefits discussed and monitors and equipment checked Spinal Block Patient position: sitting Prep: ChloraPrep Patient monitoring: heart rate, continuous pulse ox, blood pressure and cardiac monitor Approach: midline Location: L4-5 Injection technique: single-shot Needle Needle type: Introducer and Pencan  Needle gauge: 24 G Needle length: 9 cm Additional Notes Negative paresthesia. Negative blood return. Positive free-flowing CSF. Expiration date of kit checked and confirmed. Patient tolerated procedure well, without complications.

## 2019-01-21 NOTE — Anesthesia Post-op Follow-up Note (Signed)
Anesthesia QCDR form completed.        

## 2019-01-21 NOTE — Anesthesia Preprocedure Evaluation (Signed)
Anesthesia Evaluation  Patient identified by MRN, date of birth, ID band Patient awake    Reviewed: Allergy & Precautions, H&P , NPO status , Patient's Chart, lab work & pertinent test results, reviewed documented beta blocker date and time   Airway Mallampati: II   Neck ROM: full    Dental  (+) Poor Dentition   Pulmonary neg pulmonary ROS, asthma ,    Pulmonary exam normal        Cardiovascular Exercise Tolerance: Good hypertension, negative cardio ROS Normal cardiovascular exam Rhythm:regular Rate:Normal     Neuro/Psych  Neuromuscular disease negative neurological ROS  negative psych ROS   GI/Hepatic negative GI ROS, Neg liver ROS, hiatal hernia, GERD  ,  Endo/Other  negative endocrine ROSdiabetes  Renal/GU negative Renal ROS  negative genitourinary   Musculoskeletal   Abdominal   Peds  Hematology negative hematology ROS (+)   Anesthesia Other Findings Past Medical History: No date: Arthritis No date: Asthma No date: Difficult intubation     Comment:  pt denies No date: DVT, lower extremity (HCC)     Comment:  right leg that went to lung No date: GERD (gastroesophageal reflux disease) No date: History of hiatal hernia No date: Pulmonary embolism (HCC) Past Surgical History: No date: BREAST BIOPSY; Bilateral     Comment:  bilat bx done neg at [facility]  2000: BREAST SURGERY     Comment:  Reduction No date: CHOLECYSTECTOMY 04/08/2015: COLONOSCOPY; N/A     Comment:  Procedure: COLONOSCOPY;  Surgeon: Midge Minium, MD;                Location: [facility];  Service:               Gastroenterology;  Laterality: N/A; 07/07/2015: ESOPHAGOGASTRODUODENOSCOPY (EGD) WITH PROPOFOL; N/A     Comment:  Procedure: ESOPHAGOGASTRODUODENOSCOPY (EGD) WITH               PROPOFOL;  Surgeon: Midge Minium, MD;  Location: [facility];  Service: Endoscopy;  Laterality: N/A; 11/01/2015: GASTRIC ROUX-EN-Y;  N/A     Comment:  Procedure: LAPAROSCOPIC ROUX-EN-Y GASTRIC, HIATAL HERNIA              REPAIR, EXTENSIVE LYSIS OF ADHESIONS;  Surgeon: Everette Rank, MD;  Location: [facility];  Service: General;                Laterality: N/A; No date: HEEL SPUR SURGERY; Bilateral 01/19/2019: IVC FILTER INSERTION; N/A     Comment:  Procedure: IVC FILTER INSERTION;  Surgeon: Annice Needy,              MD;  Location: [facility];  Service:               Cardiovascular;  Laterality: N/A; 2000: REDUCTION MAMMAPLASTY; Bilateral 2009: ROTATOR CUFF REPAIR; Right No date: TONSILLECTOMY No date: TUBAL LIGATION BMI    Body Mass Index:  36.49 kg/m     Reproductive/Obstetrics negative OB ROS                             Anesthesia Physical Anesthesia Plan  ASA: III  Anesthesia Plan: Spinal   Post-op Pain Management:    Induction:   PONV Risk Score and Plan: 3  Airway Management Planned:  Additional Equipment:   Intra-op Plan:   Post-operative Plan:   Informed Consent: I have reviewed the patients History and Physical, chart, labs and discussed the procedure including the risks, benefits and alternatives for the proposed anesthesia with the patient or authorized representative who has indicated his/her understanding and acceptance.     Dental Advisory Given  Plan Discussed with: CRNA  Anesthesia Plan Comments:         Anesthesia Quick Evaluation

## 2019-01-22 ENCOUNTER — Encounter: Payer: Self-pay | Admitting: Orthopedic Surgery

## 2019-01-22 NOTE — Evaluation (Signed)
Physical Therapy Evaluation Patient Details Name: Erica Powers MRN: [id number] DOB: 04/18/1961 Today's Date: 01/22/2019   History of Present Illness  Patient is a 58 year old female admitted for R TKA. Patient with PMH to include: OA, asthma, DVT, GERD, PE    Clinical Impression  Patient very eager to get up and move, feeling good. Patient performs bed mobility with modified independence, use of bed rail and increased time. Sit to stand with min guard. Patient ambulated 80 feet with RW and min guard. Cues needed for increased right knee flexion with swing phase. Able to correct with cues. Patient will benefit from skilled PT to address her strength, decreased ROM and functional limitations to return to maximal independence level.      Follow Up Recommendations Home health PT    Equipment Recommendations  Rolling walker with 5" wheels    Recommendations for Other Services       Precautions / Restrictions Precautions Precautions: Fall Restrictions Weight Bearing Restrictions: Yes RLE Weight Bearing: Weight bearing as tolerated      Mobility  Bed Mobility Overal bed mobility: Modified Independent             General bed mobility comments: use of rails, increased time  Transfers Overall transfer level: Needs assistance Equipment used: Rolling walker (2 wheeled) Transfers: Sit to/from Stand Sit to Stand: Min guard            Ambulation/Gait Ambulation/Gait assistance: Min guard Gait Distance (Feet): 80 Feet Assistive device: Rolling walker (2 wheeled) Gait Pattern/deviations: Step-through pattern;Decreased step length - right;Decreased step length - left Gait velocity: decreased   General Gait Details: patient circumducting right LE, able to correct with cues  Stairs            Wheelchair Mobility    Modified Rankin (Stroke Patients Only)       Balance Overall balance assessment: Modified Independent                                            Pertinent Vitals/Pain Pain Assessment: 0-10 Pain Score: 5  Pain Descriptors / Indicators: Aching;Discomfort;Sore Pain Intervention(s): Limited activity within patient's tolerance;Monitored during session;Ice applied;Repositioned    Home Living Family/patient expects to be discharged to:: Private residence Living Arrangements: Alone   Type of Home: House Home Access: Stairs to enter Entrance Stairs-Rails: Right Entrance Stairs-Number of Steps: 2 at front 4-5 at back Home Layout: Two level;Able to live on main level with bedroom/bathroom        Prior Function Level of Independence: Independent               Hand Dominance        Extremity/Trunk Assessment   Upper Extremity Assessment Upper Extremity Assessment: Overall WFL for tasks assessed    Lower Extremity Assessment Lower Extremity Assessment: RLE deficits/detail RLE Deficits / Details: weakness due to pain/surgery however WNL  RLE Sensation: WNL       Communication   Communication: No difficulties  Cognition Arousal/Alertness: Awake/alert Behavior During Therapy: WFL for tasks assessed/performed Overall Cognitive Status: Within Functional Limits for tasks assessed                                        General Comments      Exercises Total  Joint Exercises Ankle Circles/Pumps: AROM;10 reps;Both Quad Sets: AROM;10 reps;Supine Heel Slides: AROM;10 reps;Supine Hip ABduction/ADduction: AROM;10 reps;Supine Goniometric ROM: 5-80   Assessment/Plan    PT Assessment Patient needs continued PT services  PT Problem List Decreased strength;Decreased balance;Pain;Decreased mobility;Decreased activity tolerance;Decreased safety awareness       PT Treatment Interventions DME instruction;Functional mobility training;Balance training;Patient/family education;Gait training;Therapeutic activities;Neuromuscular re-education;Stair training;Therapeutic exercise    PT Goals  (Current goals can be found in the Care Plan section)  Acute Rehab PT Goals Patient Stated Goal: to return home, get back to work PT Goal Formulation: With patient Time For Goal Achievement: 02/05/19 Potential to Achieve Goals: Good    Frequency BID   Barriers to discharge Decreased caregiver support      Co-evaluation               AM-PAC PT "6 Clicks" Mobility  Outcome Measure Help needed turning from your back to your side while in a flat bed without using bedrails?: A Little Help needed moving from lying on your back to sitting on the side of a flat bed without using bedrails?: A Little Help needed moving to and from a bed to a chair (including a wheelchair)?: A Little Help needed standing up from a chair using your arms (e.g., wheelchair or bedside chair)?: A Little Help needed to walk in hospital room?: A Little Help needed climbing 3-5 steps with a railing? : A Little 6 Click Score: 18    End of Session Equipment Utilized During Treatment: Gait belt Activity Tolerance: Patient tolerated treatment well Patient left: in chair;with chair alarm set;with call bell/phone within reach;with family/visitor present Nurse Communication: Mobility status PT Visit Diagnosis: Muscle weakness (generalized) (M62.81);Difficulty in walking, not elsewhere classified (R26.2);Pain Pain - Right/Left: Right Pain - part of body: Knee    Time: 0920-0945 PT Time Calculation (min) (ACUTE ONLY): 25 min   Charges:   PT Evaluation $PT Eval Moderate Complexity: 1 Mod PT Treatments $Gait Training: 8-22 mins        Fadi Menter, PT, GCS 01/22/19,10:00 AM

## 2019-01-22 NOTE — Care Management Note (Signed)
Case Management Note  Patient Details  Name: ASIA DUSENBURY MRN: [id number] Date of Birth: 01-Jul-1961  Subjective/Objective:                  Met with patient to discuss DC plan and needs Patient needs a RW and BSC, notified [place]  Patient provided [facility] list per CMS.gov, chose [facility], notified Helene Kelp with [facility] Patient uses Dr. Veda Canning as PCP Pharmacy is [facility] in [place] Can afford medication Patient has transportation set up  Action/Plan: [facility] list provided per CMS.gov Chose [facility] for [employer], notified Teresa   Expected Discharge Date:                  Expected Discharge Plan:     In-House Referral:     Discharge planning Services  CM Consult  Post Acute Care Choice:    Choice offered to:  Patient  DME Arranged:  3-N-1, Walker rolling DME Agency:     HH Arranged:  PT [place]:  [facility] [employer]  Status of Service:  In process, will continue to follow  If discussed at Long Length of Stay Meetings, dates discussed:    Additional Comments:  Su Hilt, RN 01/22/2019, 11:30 AM

## 2019-01-22 NOTE — Progress Notes (Signed)
Physical Therapy Treatment Patient Details Name: Erica Powers MRN: [id number] DOB: 08/14/1961 Today's Date: 01/22/2019    History of Present Illness Patient is a 58 year old female who was admitted  to [facility] for a Right TKA. Patient with PMH to include: OA, asthma, DVT, GERD, PE      PT Comments    Patient ready for PT session. Would like to return to bed to rest after walking. Patient reports minimal pain, feeling good. Increased ability with transfer, bed mobility and ambulation this pm. Requiring supervision / min guard for all activities. Patient ambulated 200 feet with RW and supervision. Slow steady pace with improved knee flexion on right during gait noted this pm. Patient will continue to benefit from skilled PT to improve strength and functional independence to return home at discharge.        Follow Up Recommendations  Home health PT     Equipment Recommendations  Rolling walker with 5" wheels;3in1 (PT)    Recommendations for Other Services       Precautions / Restrictions Precautions Precautions: Fall Restrictions Weight Bearing Restrictions: Yes RLE Weight Bearing: Weight bearing as tolerated    Mobility  Bed Mobility Overal bed mobility: Modified Independent             General bed mobility comments: increased time and effort with sit to supine  Transfers Overall transfer level: Modified independent Equipment used: Rolling walker (2 wheeled) Transfers: Sit to/from Stand Sit to Stand: Supervision         General transfer comment: Mobility: Per PT report  Ambulation/Gait Ambulation/Gait assistance: Supervision Gait Distance (Feet): 200 Feet Assistive device: Rolling walker (2 wheeled) Gait Pattern/deviations: Step-through pattern Gait velocity: decreased   General Gait Details: patient circumducting right LE, able to correct with cues   Stairs             Wheelchair Mobility    Modified Rankin (Stroke Patients Only)        Balance Overall balance assessment: Modified Independent                                          Cognition Arousal/Alertness: Awake/alert Behavior During Therapy: WFL for tasks assessed/performed Overall Cognitive Status: Within Functional Limits for tasks assessed                                        Exercises Total Joint Exercises Ankle Circles/Pumps: AROM;10 reps;Both Quad Sets: AROM;10 reps;Right Heel Slides: AROM;10 reps;Right Straight Leg Raises: AROM;10 reps;Right Goniometric ROM: 0-85    General Comments        Pertinent Vitals/Pain Pain Assessment: 0-10 Pain Score: 3  Pain Descriptors / Indicators: Sore Pain Intervention(s): Limited activity within patient's tolerance;Monitored during session;Repositioned;Ice applied    Home Living Family/patient expects to be discharged to:: Private residence Living Arrangements: Alone   Type of Home: House Home Access: Stairs to enter Entrance Stairs-Rails: Right Home Layout: Two level;Able to live on main level with bedroom/bathroom        Prior Function Level of Independence: Independent          PT Goals (current goals can now be found in the care plan section) Acute Rehab PT Goals Patient Stated Goal: To return home PT Goal Formulation: With patient Time For Goal Achievement: 02/05/19  Potential to Achieve Goals: Good Progress towards PT goals: Progressing toward goals    Frequency    BID      PT Plan Current plan remains appropriate    Co-evaluation              AM-PAC PT "6 Clicks" Mobility   Outcome Measure  Help needed turning from your back to your side while in a flat bed without using bedrails?: A Little Help needed moving from lying on your back to sitting on the side of a flat bed without using bedrails?: A Little Help needed moving to and from a bed to a chair (including a wheelchair)?: A Little Help needed standing up from a chair using your arms  (e.g., wheelchair or bedside chair)?: A Little Help needed to walk in hospital room?: A Little Help needed climbing 3-5 steps with a railing? : A Little 6 Click Score: 18    End of Session Equipment Utilized During Treatment: Gait belt Activity Tolerance: Patient tolerated treatment well Patient left: in bed;with bed alarm set;with call bell/phone within reach Nurse Communication: Mobility status PT Visit Diagnosis: Muscle weakness (generalized) (M62.81);Difficulty in walking, not elsewhere classified (R26.2);Pain Pain - Right/Left: Right Pain - part of body: Knee     Time: 1345-1409 PT Time Calculation (min) (ACUTE ONLY): 24 min  Charges:  $Gait Training: 8-22 mins $Therapeutic Exercise: 8-22 mins                     Anna Beaird, PT, GCS 01/22/19,2:26 PM

## 2019-01-22 NOTE — Progress Notes (Signed)
Pnt's foley removed approximately 0600, per post-op care/order.  

## 2019-01-22 NOTE — Progress Notes (Signed)
  Subjective: 1 Day Post-Op Procedure(s) (LRB): TOTAL KNEE ARTHROPLASTY with navigation (Right) Patient reports pain as mild.   Patient seen in rounds with Dr. [name]. Patient is well, and has had no acute complaints or problems Plan is to go Home after hospital stay. Negative for chest pain and shortness of breath Fever: no Gastrointestinal: Negative for nausea and vomiting  Objective: Vital signs in last 24 hours: Temp:  [97.3 F (36.3 C)-98.7 F (37.1 C)] 98.3 F (36.8 C) (02/20 0401) Pulse Rate:  [52-65] 52 (02/20 0401) Resp:  [12-20] 16 (02/20 0401) BP: (97-145)/(53-89) 99/58 (02/20 0401) SpO2:  [97 %-100 %] 100 % (02/20 0401) Weight:  [93.4 kg] 93.4 kg (02/19 0957)  Intake/Output from previous day:  Intake/Output Summary (Last 24 hours) at 01/22/2019 0605 Last data filed at 01/22/2019 0500 Gross per 24 hour  Intake 3587.92 ml  Output 3260 ml  Net 327.92 ml    Intake/Output this shift: Total I/O In: 2050.9 [P.O.:237; I.V.:1535.8; IV Piggyback:278.1] Out: 2090 [Urine:1950; Drains:140]  Labs: No results for input(s): HGB in the last 72 hours. No results for input(s): WBC, RBC, HCT, PLT in the last 72 hours. No results for input(s): NA, K, CL, CO2, BUN, CREATININE, GLUCOSE, CALCIUM in the last 72 hours. No results for input(s): LABPT, INR in the last 72 hours.   EXAM General - Patient is Alert and Oriented Extremity - Neurovascular intact Dorsiflexion/Plantar flexion intact Compartment soft Dressing/Incision - clean, dry, with a Hemovac intact. Motor Function - intact, moving foot and toes well on exam.  Able to straight leg raise independently  Past Medical History:  Diagnosis Date  . Arthritis   . Asthma   . Difficult intubation    pt denies  . DVT, lower extremity (HCC)    right leg that went to lung  . GERD (gastroesophageal reflux disease)   . History of hiatal hernia   . Pulmonary embolism (HCC)     Assessment/Plan: 1 Day Post-Op Procedure(s)  (LRB): TOTAL KNEE ARTHROPLASTY with navigation (Right) Active Problems:   Total knee replacement status  Estimated body mass index is 36.49 kg/m as calculated from the following:   Height as of this encounter: 5\' 3"  (1.6 m).   Weight as of this encounter: 93.4 kg. Advance diet Up with therapy D/C IV fluids Discharge home with home health planned tomorrow.  DVT Prophylaxis - Lovenox, Foot Pumps and TED hose Weight-Bearing as tolerated to right leg  Dedra Skeens, PA-C Orthopaedic Surgery 01/22/2019, 6:05 AM

## 2019-01-22 NOTE — Evaluation (Signed)
Occupational Therapy Evaluation Patient Details Name: Erica Powers MRN: [id number] DOB: Mar 11, 1961 Today's Date: 01/22/2019    History of Present Illness Patient is a 58 year old female who was admitted  to [facility] for a Right TKA. Patient with PMH to include: OA, asthma, DVT, GERD, PE     Clinical Impression   Pt. presents with weakness, limited activity tolerance, and limited functional mobility which limits the ability to complete basic ADL and IADL functioning. Pt. resides at home alone. Pt. was independent with ADLs, and IADL functioning: including: meal preparation, and medication management. Pt. was able to drive and was working for [facility]. Pt. education was provided about A/E use for LE ADLs. Pt. Was able to demonstration proper use of the sockaide, and reacher for the LLE with cues, and visual demonstration. Pt. could benefit from OT services for ADL training, A/E training, and pt. education about home modification, and DME. Pt. Plans to return home upon discharge, with family assistance as needed. No follow-up OT services are warranted at this time.    Follow Up Recommendations  No OT follow up    Equipment Recommendations       Recommendations for Other Services       Precautions / Restrictions Precautions Precautions: Fall Restrictions Weight Bearing Restrictions: Yes RLE Weight Bearing: Weight bearing as tolerated      Mobility Bed Mobility Overal bed mobility: Modified Independent             General bed mobility comments: use of rails, increased time  Transfers Overall transfer level: Needs assistance Equipment used: Rolling walker (2 wheeled) Transfers: Sit to/from Stand Sit to Stand: Min guard         General transfer comment: Mobility: Per PT report    Balance                                          ADL either performed or assessed with clinical judgement   ADL Overall ADL's : Needs  assistance/impaired Eating/Feeding: Set up;Independent;Sitting   Grooming: Set up;Independent;Sitting   Upper Body Bathing: Set up;Independent;Sitting   Lower Body Bathing: Set up;Moderate assistance   Upper Body Dressing : Set up;Independent   Lower Body Dressing: Set up;Moderate assistance                 General ADL Comments: Pt. education was provided about A/E use for LE ADLs.     Vision         Perception     Praxis      Pertinent Vitals/Pain Pain Assessment: No/denies pain Pain Score: 5  Pain Descriptors / Indicators: Aching;Discomfort;Sore Pain Intervention(s): Limited activity within patient's tolerance;Monitored during session;Ice applied;Repositioned     Hand Dominance Right   Extremity/Trunk Assessment Upper Extremity Assessment Upper Extremity Assessment: Overall WFL for tasks assessed          Communication Communication Communication: No difficulties   Cognition Arousal/Alertness: Awake/alert Behavior During Therapy: WFL for tasks assessed/performed Overall Cognitive Status: Within Functional Limits for tasks assessed                                     General Comments       Exercises   Shoulder Instructions      Home Living Family/patient expects to be discharged to:: Private  residence Living Arrangements: Alone   Type of Home: House Home Access: Stairs to enter [employer] of Steps: 2 at front 4-5 at back Entrance Stairs-Rails: Right Home Layout: Two level;Able to live on main level with bedroom/bathroom Alternate Level Stairs-Number of Steps: flight   Bathroom Shower/Tub: Tub/shower unit                    Prior Functioning/Environment Level of Independence: Independent                 OT Problem List: Decreased strength;Decreased activity tolerance;Decreased knowledge of use of DME or AE;Decreased range of motion      OT Treatment/Interventions: Self-care/ADL  training;Therapeutic exercise;Patient/family education;Therapeutic activities    OT Goals(Current goals can be found in the care plan section) Acute Rehab OT Goals Patient Stated Goal: To return home OT Goal Formulation: With patient Potential to Achieve Goals: Good  OT Frequency: Min 2X/week   Barriers to D/C:            Co-evaluation              AM-PAC OT "6 Clicks" Daily Activity     Outcome Measure Help from another person eating meals?: None Help from another person taking care of personal grooming?: None Help from another person toileting, which includes using toliet, bedpan, or urinal?: A Little Help from another person bathing (including washing, rinsing, drying)?: A Little Help from another person to put on and taking off regular upper body clothing?: None Help from another person to put on and taking off regular lower body clothing?: A Little 6 Click Score: 21   End of Session    Activity Tolerance: Patient tolerated treatment well Patient left: in chair;with call bell/phone within reach;with chair alarm set  OT Visit Diagnosis: Muscle weakness (generalized) (M62.81)                Time: 0388-8280 OT Time Calculation (min): 25 min Charges:  OT General Charges $OT Visit: 1 Visit  Olegario Messier, MS, OTR/L   Olegario Messier 01/22/2019, 1:21 PM

## 2019-01-22 NOTE — Progress Notes (Signed)
Clinical Social Worker (CSW) received SNF consult. PT is recommending home health. RN case manager aware of above. Please reconsult if future social work needs arise. CSW signing off.   Yarlin Breisch, LCSW [phone] 

## 2019-01-23 MED ORDER — CELECOXIB 200 MG PO CAPS: 200.0000 mg | ORAL_CAPSULE | Freq: Two times a day (BID) | ORAL | 1 refills | Status: DC

## 2019-01-23 MED ORDER — ENOXAPARIN SODIUM 40 MG/0.4ML ~~LOC~~ SOLN: 40.0000 mg | SUBCUTANEOUS | 0 refills | Status: DC

## 2019-01-23 MED ORDER — OXYCODONE HCL 5 MG PO TABS: 5.0000 mg | ORAL_TABLET | ORAL | 0 refills | Status: DC | PRN

## 2019-01-23 MED ORDER — TRAMADOL HCL 50 MG PO TABS: 50.0000 mg | ORAL_TABLET | ORAL | 1 refills | Status: DC | PRN

## 2019-01-23 NOTE — Progress Notes (Signed)
  Subjective: 2 Days Post-Op Procedure(s) (LRB): TOTAL KNEE ARTHROPLASTY with navigation (Right) Patient reports pain as mild.   Patient seen in rounds with Dr. [name]. Patient is well, and has had no acute complaints or problems Plan is to go Home after hospital stay. Negative for chest pain and shortness of breath Fever: no Gastrointestinal: Negative for nausea and vomiting  Objective: Vital signs in last 24 hours: Temp:  [98 F (36.7 C)-98.3 F (36.8 C)] 98.1 F (36.7 C) (02/20 2259) Pulse Rate:  [56-59] 56 (02/20 2259) Resp:  [18] 18 (02/20 2259) BP: (101-107)/(49-64) 107/61 (02/20 2259) SpO2:  [99 %-100 %] 99 % (02/20 2259)  Intake/Output from previous day:  Intake/Output Summary (Last 24 hours) at 01/23/2019 0856 Last data filed at 01/23/2019 0300 Gross per 24 hour  Intake 1714.63 ml  Output 195 ml  Net 1519.63 ml    Intake/Output this shift: No intake/output data recorded.  Labs: No results for input(s): HGB in the last 72 hours. No results for input(s): WBC, RBC, HCT, PLT in the last 72 hours. No results for input(s): NA, K, CL, CO2, BUN, CREATININE, GLUCOSE, CALCIUM in the last 72 hours. No results for input(s): LABPT, INR in the last 72 hours.   EXAM General - Patient is Alert and Oriented Extremity - Neurovascular intact Dorsiflexion/Plantar flexion intact Compartment soft Dressing/Incision - clean, dry, with the Hemovac removed.  The tubing appeared to be intact on removal. Motor Function - intact, moving foot and toes well on exam.  Able to straight leg raise independently.  Ambulated 200 feet with physical therapy.  Past Medical History:  Diagnosis Date  . Arthritis   . Asthma   . Difficult intubation    pt denies  . DVT, lower extremity (HCC)    right leg that went to lung  . GERD (gastroesophageal reflux disease)   . History of hiatal hernia   . Pulmonary embolism (HCC)     Assessment/Plan: 2 Days Post-Op Procedure(s) (LRB): TOTAL KNEE  ARTHROPLASTY with navigation (Right) Active Problems:   Total knee replacement status  Estimated body mass index is 36.49 kg/m as calculated from the following:   Height as of this encounter: 5\' 3"  (1.6 m).   Weight as of this encounter: 93.4 kg. Advance diet Up with therapy D/C IV fluids Discharge home with home health planned today.  DVT Prophylaxis - Lovenox, Foot Pumps and TED hose Weight-Bearing as tolerated to right leg  Dedra Skeens, PA-C Orthopaedic Surgery 01/23/2019, 8:56 AM

## 2019-01-23 NOTE — Discharge Summary (Signed)
Physician Discharge Summary  Subjective: 2 Days Post-Op Procedure(s) (LRB): TOTAL KNEE ARTHROPLASTY with navigation (Right) Patient reports pain as moderate.   Patient seen in rounds with Dr. [name]. Patient is well, and has had no acute complaints or problems Patient is ready to go home with home health physical therapy  Physician Discharge Summary  Patient ID: Erica Powers MRN: [id number] DOB/AGE: 08/12/1961 58 y.o.  Admit date: 01/21/2019 Discharge date: 01/23/2019  Admission Diagnoses:  Discharge Diagnoses:  Active Problems:   Total knee replacement status   Discharged Condition: fair  Hospital Course: The patient is postop day 2 from a right total knee replacement.  She has done very well with physical therapy and is ambulated 200 feet.  She had a bowel movement this morning.  The patient has had stable vitals.  Her Hemovac was removed this morning.  The patient is ready go home with home health physical therapy.  Treatments: surgery:  Right total knee arthroplasty using computer-assisted navigation  SURGEON: Jena Gauss. M.D.  ASSISTANT: Anabel Halon, PA-C (present and scrubbed throughout the case, critical for assistance with exposure, retraction, instrumentation, and closure)  ANESTHESIA: spinal  ESTIMATED BLOOD LOSS: 50 mL  FLUIDS REPLACED: 1300 mL of crystalloid  TOURNIQUET TIME: 133 minutes  DRAINS: 2 medium Hemovac drains  SOFT TISSUE RELEASES: Anterior cruciate ligament, posterior cruciate ligament, deep medial collateral ligament, patellofemoral ligament, posterolateral corner, and pie crusting of the IT band  IMPLANTS UTILIZED: DePuy Attune size 7 posterior stabilized femoral component (cemented), size 7 rotating platform tibial component (cemented), 41 mm medialized dome patella (cemented), and a 5 mm stabilized rotating platform polyethylene insert.   Discharge Exam: Blood pressure 107/61, pulse (!) 56, temperature 98.1 F (36.7 C), temperature  source Oral, resp. rate 18, height 5\' 3"  (1.6 m), weight 93.4 kg, SpO2 99 %.   Disposition: Discharge disposition: 01-Home or Self Care        Allergies as of 01/23/2019      Reactions   Codeine Itching   Hydrocodone Itching      Medication List    STOP taking these medications   meloxicam 7.5 MG tablet Commonly known as:  MOBIC     TAKE these medications   acetaminophen 500 MG tablet Commonly known as:  TYLENOL Take 500 mg by mouth every 6 (six) hours as needed.   albuterol 108 (90 Base) MCG/ACT inhaler Commonly known as:  PROVENTIL HFA;VENTOLIN HFA Inhale 1-2 puffs into the lungs every 4 (four) hours as needed for wheezing or shortness of breath.   CALTRATE 600+D 600-400 MG-UNIT tablet Generic drug:  Calcium Carbonate-Vitamin D Take 1 tablet by mouth daily.   celecoxib 200 MG capsule Commonly known as:  CELEBREX Take 1 capsule (200 mg total) by mouth 2 (two) times daily.   cyclobenzaprine 5 MG tablet Commonly known as:  FLEXERIL Take 1 tablet by mouth daily as needed for spasms.   diclofenac sodium 1 % Gel Commonly known as:  VOLTAREN Apply 1 application topically 3 (three) times daily as needed (knee pain).   enoxaparin 40 MG/0.4ML injection Commonly known as:  LOVENOX Inject 0.4 mLs (40 mg total) into the skin daily.   mometasone 0.1 % lotion Commonly known as:  ELOCON Apply 4 drops topically at bedtime as needed for dry skin.   multivitamin with minerals tablet Take 1 tablet by mouth daily.   oxyCODONE 5 MG immediate release tablet Commonly known as:  Oxy IR/ROXICODONE Take 1 tablet (5 mg total) by mouth  every 4 (four) hours as needed for moderate pain (pain score 4-6).   traMADol 50 MG tablet Commonly known as:  ULTRAM Take 1-2 tablets (50-100 mg total) by mouth every 4 (four) hours as needed for moderate pain.            Durable Medical Equipment  (From admission, onward)         Start     Ordered   01/21/19 2008  DME Walker rolling   Once    Question:  Patient needs a walker to treat with the following condition  Answer:  Total knee replacement status   01/21/19 2007   01/21/19 2008  DME Bedside commode  Once    Question:  Patient needs a bedside commode to treat with the following condition  Answer:  Total knee replacement status   01/21/19 2007         Follow-up Information    Ronnette Juniper On 02/06/2019.   Specialties:  Orthopedic Surgery, Emergency Medicine Why:  at 8:45am Contact information: [address] [place] [address] [phone]        Donato Heinz, MD On 03/05/2019.   Specialty:  Orthopedic Surgery Why:  at 10:45am Contact information: [address] [facility] [place] [address] [phone]           Signed: Lenard Forth, Maimouna Rondeau 01/23/2019, 9:00 AM   Objective: Vital signs in last 24 hours: Temp:  [98 F (36.7 C)-98.3 F (36.8 C)] 98.1 F (36.7 C) (02/20 2259) Pulse Rate:  [56-59] 56 (02/20 2259) Resp:  [18] 18 (02/20 2259) BP: (101-107)/(49-64) 107/61 (02/20 2259) SpO2:  [99 %-100 %] 99 % (02/20 2259)  Intake/Output from previous day:  Intake/Output Summary (Last 24 hours) at 01/23/2019 0900 Last data filed at 01/23/2019 0300 Gross per 24 hour  Intake 1474.63 ml  Output 195 ml  Net 1279.63 ml    Intake/Output this shift: No intake/output data recorded.  Labs: No results for input(s): HGB in the last 72 hours. No results for input(s): WBC, RBC, HCT, PLT in the last 72 hours. No results for input(s): NA, K, CL, CO2, BUN, CREATININE, GLUCOSE, CALCIUM in the last 72 hours. No results for input(s): LABPT, INR in the last 72 hours.  EXAM: General - Patient is Alert and Oriented Extremity - Sensation intact distally Intact pulses distally No cellulitis present Compartment soft Incision - clean, dry, with Hemovac removed.  The Hemovac tubing appeared to be intact on removal. Motor Function -plantarflexion and dorsiflexion are intact.   Able to straight leg raise independently.  Ambulated 200 feet with physical therapy  Assessment/Plan: 2 Days Post-Op Procedure(s) (LRB): TOTAL KNEE ARTHROPLASTY with navigation (Right) Procedure(s) (LRB): TOTAL KNEE ARTHROPLASTY with navigation (Right) Past Medical History:  Diagnosis Date  . Arthritis   . Asthma   . Difficult intubation    pt denies  . DVT, lower extremity (HCC)    right leg that went to lung  . GERD (gastroesophageal reflux disease)   . History of hiatal hernia   . Pulmonary embolism (HCC)    Active Problems:   Total knee replacement status  Estimated body mass index is 36.49 kg/m as calculated from the following:   Height as of this encounter: 5\' 3"  (1.6 m).   Weight as of this encounter: 93.4 kg. Advance diet Up with therapy Discharge home with home health Diet - Regular diet Follow up - in 2 weeks Activity - WBAT Disposition - Home Condition Upon  Discharge - Stable DVT Prophylaxis - Lovenox and TED hose  Dedra Skeensodd Alphonsus Doyel, PA-C Orthopaedic Surgery 01/23/2019, 9:00 AM

## 2019-01-23 NOTE — Care Management (Signed)
Patient requested that I call [facility] in [place] to get the price for lovenox, she thinks they may have her new insurance card.  Called [facility] at [phone], verbally called in the Lovenox 40 mg X 14 once daily, [facility] stated the price for her Copay is 50$, notified the patient

## 2019-01-23 NOTE — Care Management (Signed)
Called [facility] in [place] at [phone] to do a verbal order for lovenox and obtain price  Unable to get the cost of the medication due to the Patient not having new insurance information on file at the Pharmacy.  Notified the patient

## 2019-01-23 NOTE — Progress Notes (Signed)
Occupational Therapy Treatment Patient Details Name: Erica Powers MRN: [id number] DOB: [birth year] Today's Date: 01/23/2019    History of present illness Patient is a 58 year old female who was admitted  to [facility] for a Right TKA. Patient with PMH to include: OA, asthma, DVT, GERD, PE     OT comments  Pt. education was provided, and A/E use reviewed for LE ADLs. Pt. peports having plans in place for family, and friends to stay with the patient through next Thursday. Pt. Continues to benefit from OT services for ADL training, A/E training, and pt. Education about home modification, and DME while at [facility]. Pt. Plans to return home upon discharge, with family assistance as needed. No follow-up OT services are warranted at this time.   Follow Up Recommendations  No OT follow up    Equipment Recommendations       Recommendations for Other Services      Precautions / Restrictions Precautions Precautions: Fall;Knee Precaution Booklet Issued: Yes (comment) Required Braces or Orthoses: Knee Immobilizer - Right Knee Immobilizer - Right: Discontinue once straight leg raise with < 10 degree lag Restrictions Weight Bearing Restrictions: Yes RLE Weight Bearing: Weight bearing as tolerated       Mobility Bed Mobility Overal bed mobility: Modified Independent             General bed mobility comments: Supine to/from sit with mild increased effort but no assist or cueing required; bed flat  Transfers Overall transfer level: Modified independent Equipment used: Rolling walker (2 wheeled) Transfers: Sit to/from [employer] Sit to Stand: Modified independent (Device/Increase time) Stand pivot transfers: Modified independent (Device/Increase time)       General transfer comment: Mobility per     Balance                         ADL either performed or assessed with clinical judgement   ADL Overall ADL's : Needs assistance/impaired Eating/Feeding: Set  up;Independent;Sitting   Grooming: Set up;Independent;Sitting   Upper Body Bathing: Set up;Independent;Sitting   Lower Body Bathing: Minimal assistance;Set up   Upper Body Dressing : Set up;Independent   Lower Body Dressing: Set up;Moderate assistance                       Vision       Perception     Praxis      Cognition Arousal/Alertness: Awake/alert Behavior During Therapy: WFL for tasks assessed/performed Overall Cognitive Status: Within Functional Limits for tasks assessed                                          Exercises Total Joint Exercises Goniometric ROM: 0-90 degrees   Shoulder Instructions       General Comments increased bloody drainage noted from removed hemovac site R thigh (still within dressing); nurse notified    Pertinent Vitals/ Pain       Pain Assessment: No/denies pain Pain Score: 3  Pain Descriptors / Indicators: Sore Pain Intervention(s): Limited activity within patient's tolerance;Monitored during session;Premedicated before session;Repositioned;Other (comment)(polar care applied and activated)  Home Living                                          Prior  Functioning/Environment              Frequency           Progress Toward Goals  OT Goals(current goals can now be found in the care plan section)     Acute Rehab OT Goals Patient Stated Goal: To return home OT Goal Formulation: With patient Potential to Achieve Goals: Good  Plan      Co-evaluation                 AM-PAC OT "6 Clicks" Daily Activity     Outcome Measure   Help from another person eating meals?: None Help from another person taking care of personal grooming?: None Help from another person toileting, which includes using toliet, bedpan, or urinal?: A Little Help from another person bathing (including washing, rinsing, drying)?: A Little Help from another person to put on and taking off regular upper  body clothing?: None Help from another person to put on and taking off regular lower body clothing?: A Little 6 Click Score: 21    End of Session        Activity Tolerance Patient tolerated treatment well   Patient Left in chair;with call bell/phone within reach;with chair alarm set   Nurse Communication          Time: 1030-1045 OT Time Calculation (min): 15 min  Charges: OT General Charges $OT Visit: 1 Visit OT Treatments $Self Care/Home Management : 8-22 mins  Olegario Messier, MS, OTR/L  Olegario Messier 01/23/2019, 12:16 PM

## 2019-01-23 NOTE — Care Management (Signed)
Paged Dedra Skeens at [phone], requesting a call to notify him that this patient has a Filter and verify the need for Lovenox, awaiting a call back

## 2019-01-23 NOTE — Progress Notes (Signed)
Physical Therapy Treatment Patient Details Name: Erica Powers MRN: [id number] DOB: 1961-02-12 Today's Date: 01/23/2019    History of Present Illness Patient is a 58 year old female who was admitted  to [facility] for a Right TKA. Patient with PMH to include: OA, asthma, DVT, GERD, PE      PT Comments    Pt modified independent with bed mobility, modified independent with transfers using RW; SBA with ambulation around nursing loop using RW; and CGA navigating 8 stairs with railing.  Overall pt steady and safe with all functional mobility during session.  3/10 R knee pain at rest beginning of session and post ambulation.  R knee ROM 0-90 degrees.  Pt appears safe to discharge home with HHPT when medically appropriate (nurse notified).    Follow Up Recommendations  Home health PT     Equipment Recommendations  Rolling walker with 5" wheels;3in1 (PT)    Recommendations for Other Services       Precautions / Restrictions Precautions Precautions: Fall;Knee Precaution Booklet Issued: Yes (comment) Required Braces or Orthoses: Knee Immobilizer - Right Knee Immobilizer - Right: Discontinue once straight leg raise with < 10 degree lag Restrictions Weight Bearing Restrictions: Yes RLE Weight Bearing: Weight bearing as tolerated    Mobility  Bed Mobility Overal bed mobility: Modified Independent             General bed mobility comments: Supine to/from sit with mild increased effort but no assist or cueing required; bed flat  Transfers Overall transfer level: Modified independent Equipment used: Rolling walker (2 wheeled) Transfers: Sit to/from [employer] Sit to Stand: Modified independent (Device/Increase time) Stand pivot transfers: Modified independent (Device/Increase time)       General transfer comment: steady safe transfers noted with RW use  Ambulation/Gait Ambulation/Gait assistance: Supervision Gait Distance (Feet): (140 feet; 220 feet) Assistive  device: Rolling walker (2 wheeled)   Gait velocity: decreased   General Gait Details: initial vc's for R LE heelstrike and smoother gait pattern but improved with repetition, vc's, and increased distance ambulating; mild decreased stance time R LE   Stairs Stairs: Yes Stairs assistance: Min guard Stair Management: One rail Left;Forwards;Step to pattern Number of Stairs: 8 General stair comments: initial vc's for technique and then pt able to safely perform on own   Wheelchair Mobility    Modified Rankin (Stroke Patients Only)       Balance Overall balance assessment: Needs assistance Sitting-balance support: No upper extremity supported;Feet supported Sitting balance-Leahy Scale: Normal Sitting balance - Comments: steady sitting reaching outside BOS   Standing balance support: During functional activity Standing balance-Leahy Scale: Good Standing balance comment: steady with functional mobility with RW use                            Cognition Arousal/Alertness: Awake/alert Behavior During Therapy: WFL for tasks assessed/performed Overall Cognitive Status: Within Functional Limits for tasks assessed                                        Exercises Total Joint Exercises Goniometric ROM: 0-90 degrees  Reviewed TKA written HEP (issued pt paper copy): pt verbalizing appropriate understanding.    General Comments General comments (skin integrity, edema, etc.): increased bloody drainage noted from removed hemovac site R thigh (still within dressing); nurse notified.  Pt agreeable to PT session.  Pertinent Vitals/Pain Pain Assessment: No/denies pain Pain Score: 3  Pain Descriptors / Indicators: Sore Pain Intervention(s): Limited activity within patient's tolerance;Monitored during session;Premedicated before session;Repositioned;Other (comment)(polar care applied and activated)  Vitals (HR and O2 on room air) stable and WFL throughout  treatment session.    Home Living                      Prior Function            PT Goals (current goals can now be found in the care plan section) Acute Rehab PT Goals Patient Stated Goal: To return home PT Goal Formulation: With patient Time For Goal Achievement: 02/05/19 Potential to Achieve Goals: Good Progress towards PT goals: Progressing toward goals    Frequency    BID      PT Plan Current plan remains appropriate    Co-evaluation              AM-PAC PT "6 Clicks" Mobility   Outcome Measure  Help needed turning from your back to your side while in a flat bed without using bedrails?: None Help needed moving from lying on your back to sitting on the side of a flat bed without using bedrails?: None Help needed moving to and from a bed to a chair (including a wheelchair)?: None Help needed standing up from a chair using your arms (e.g., wheelchair or bedside chair)?: None Help needed to walk in hospital room?: A Little Help needed climbing 3-5 steps with a railing? : A Little 6 Click Score: 22    End of Session Equipment Utilized During Treatment: Gait belt Activity Tolerance: Patient tolerated treatment well Patient left: in chair;with call bell/phone within reach;with chair alarm set;with nursing/sitter in room;with SCD's reapplied;Other (comment)(B heels elevated via towel rolls; polar care in place and activated) Nurse Communication: Mobility status;Precautions;Weight bearing status;Other (comment)(Pt's pain status) PT Visit Diagnosis: Muscle weakness (generalized) (M62.81);Difficulty in walking, not elsewhere classified (R26.2);Pain Pain - Right/Left: Right Pain - part of body: Knee     Time: 0915-1003 PT Time Calculation (min) (ACUTE ONLY): 48 min  Charges:  $Gait Training: 8-22 mins $Therapeutic Exercise: 8-22 mins $Therapeutic Activity: 8-22 mins                    Hendricks Limes, PT 01/23/19, 10:19 AM [phone]

## 2019-02-26 ENCOUNTER — Other Ambulatory Visit (INDEPENDENT_AMBULATORY_CARE_PROVIDER_SITE_OTHER): Payer: Self-pay | Admitting: Vascular Surgery

## 2019-02-26 DIAGNOSIS — I82403 Acute embolism and thrombosis of unspecified deep veins of lower extremity, bilateral: Secondary | ICD-10-CM

## 2019-03-03 ENCOUNTER — Ambulatory Visit (INDEPENDENT_AMBULATORY_CARE_PROVIDER_SITE_OTHER): Payer: BLUE CROSS/BLUE SHIELD

## 2019-03-03 ENCOUNTER — Ambulatory Visit (INDEPENDENT_AMBULATORY_CARE_PROVIDER_SITE_OTHER): Payer: BLUE CROSS/BLUE SHIELD | Admitting: Vascular Surgery

## 2019-03-03 ENCOUNTER — Other Ambulatory Visit: Payer: Self-pay

## 2019-03-03 ENCOUNTER — Telehealth (INDEPENDENT_AMBULATORY_CARE_PROVIDER_SITE_OTHER): Payer: Self-pay

## 2019-03-03 ENCOUNTER — Encounter (INDEPENDENT_AMBULATORY_CARE_PROVIDER_SITE_OTHER): Payer: Self-pay | Admitting: Vascular Surgery

## 2019-03-03 ENCOUNTER — Encounter (INDEPENDENT_AMBULATORY_CARE_PROVIDER_SITE_OTHER): Payer: Self-pay

## 2019-03-03 DIAGNOSIS — E119 Type 2 diabetes mellitus without complications: Secondary | ICD-10-CM | POA: Diagnosis not present

## 2019-03-03 DIAGNOSIS — I82403 Acute embolism and thrombosis of unspecified deep veins of lower extremity, bilateral: Secondary | ICD-10-CM | POA: Diagnosis not present

## 2019-03-03 DIAGNOSIS — Z95828 Presence of other vascular implants and grafts: Secondary | ICD-10-CM

## 2019-03-03 DIAGNOSIS — Z79899 Other long term (current) drug therapy: Secondary | ICD-10-CM

## 2019-03-03 DIAGNOSIS — R600 Localized edema: Secondary | ICD-10-CM

## 2019-03-03 DIAGNOSIS — M7989 Other specified soft tissue disorders: Secondary | ICD-10-CM

## 2019-03-03 DIAGNOSIS — Z96651 Presence of right artificial knee joint: Secondary | ICD-10-CM

## 2019-03-03 DIAGNOSIS — I2699 Other pulmonary embolism without acute cor pulmonale: Secondary | ICD-10-CM

## 2019-03-03 DIAGNOSIS — I10 Essential (primary) hypertension: Secondary | ICD-10-CM

## 2019-03-03 NOTE — Assessment & Plan Note (Signed)
Now finished surgery and doing well with rehab

## 2019-03-03 NOTE — Progress Notes (Signed)
MRN : [id number]  Erica Powers is a 58 y.o. (1961-11-01) female who presents with chief complaint of  Chief Complaint  Patient presents with  . Follow-up    ultasound  .  History of Present Illness: Patient returns today in follow up of her IVC filter which was placed preoperatively for her knee replacement.  She did very well with her knee replacement and has resumed nearly normal activity and mobility at this point.  Her surgery was about 6 weeks ago.  She has not had dramatic pain or swelling in her leg other than the typical postoperative knee replacement symptoms.  Duplex today shows no evidence of DVT in either lower extremity.  Current Outpatient Medications  Medication Sig Dispense Refill  . acetaminophen (TYLENOL) 500 MG tablet Take 500 mg by mouth every 6 (six) hours as needed.    Marland Kitchen albuterol (PROVENTIL HFA;VENTOLIN HFA) 108 (90 Base) MCG/ACT inhaler Inhale 1-2 puffs into the lungs every 4 (four) hours as needed for wheezing or shortness of breath.     . Calcium Carbonate-Vitamin D (CALTRATE 600+D) 600-400 MG-UNIT tablet Take 1 tablet by mouth daily.     . celecoxib (CELEBREX) 200 MG capsule Take 1 capsule (200 mg total) by mouth 2 (two) times daily. 60 capsule 1  . diclofenac sodium (VOLTAREN) 1 % GEL Apply 1 application topically 3 (three) times daily as needed (knee pain).    . Multiple Vitamins-Minerals (MULTIVITAMIN WITH MINERALS) tablet Take 1 tablet by mouth daily.    . traMADol (ULTRAM) 50 MG tablet Take 1-2 tablets (50-100 mg total) by mouth every 4 (four) hours as needed for moderate pain. 30 tablet 1  . cyclobenzaprine (FLEXERIL) 5 MG tablet Take 1 tablet by mouth daily as needed for spasms.    Marland Kitchen enoxaparin (LOVENOX) 40 MG/0.4ML injection Inject 0.4 mLs (40 mg total) into the skin daily. (Patient not taking: Reported on 03/03/2019) 14 Syringe 0  . mometasone (ELOCON) 0.1 % lotion Apply 4 drops topically at bedtime as needed for dry skin.    Marland Kitchen oxyCODONE (OXY  IR/ROXICODONE) 5 MG immediate release tablet Take 1 tablet (5 mg total) by mouth every 4 (four) hours as needed for moderate pain (pain score 4-6). (Patient not taking: Reported on 03/03/2019) 30 tablet 0   No current facility-administered medications for this visit.     Past Medical History:  Diagnosis Date  . Arthritis   . Asthma   . Difficult intubation    pt denies  . DVT, lower extremity (HCC)    right leg that went to lung  . GERD (gastroesophageal reflux disease)   . History of hiatal hernia   . Pulmonary embolism [facility])     Past Surgical History:  Procedure Laterality Date  . BREAST BIOPSY Bilateral    bilat bx done neg at [facility]   . BREAST SURGERY  2000   Reduction  . CHOLECYSTECTOMY    . COLONOSCOPY N/A 04/08/2015   Procedure: COLONOSCOPY;  Surgeon: Midge Minium, MD;  Location[facility];  Service: Gastroenterology;  Laterality: N/A;  . ESOPHAGOGASTRODUODENOSCOPY (EGD) WITH PROPOFOL N/A 07/07/2015   Procedure: ESOPHAGOGASTRODUODENOSCOPY (EGD) WITH PROPOFOL;  Surgeon: Midge Minium, MD;  Location: [facility];  Service: Endoscopy;  Laterality: N/A;  . GASTRIC ROUX-EN-Y N/A 11/01/2015   Procedure: LAPAROSCOPIC ROUX-EN-Y GASTRIC, HIATAL HERNIA REPAIR, EXTENSIVE LYSIS OF ADHESIONS;  Surgeon: Everette Rank, MD;  Location: [facility] ORS;  Service: General;  Laterality: N/A;  . HEEL SPUR SURGERY Bilateral   .  IVC FILTER INSERTION N/A 01/19/2019   Procedure: IVC FILTER INSERTION;  Surgeon: Annice Needy, MD;  Location: [facility] INVASIVE CV LAB;  Service: Cardiovascular;  Laterality: N/A;  . REDUCTION MAMMAPLASTY Bilateral 2000  . ROTATOR CUFF REPAIR Right 2009  . TONSILLECTOMY    . TOTAL KNEE ARTHROPLASTY Right 01/21/2019   Procedure: TOTAL KNEE ARTHROPLASTY with navigation;  Surgeon: Donato Heinz, MD;  Location: [facility] ORS;  Service: Orthopedics;  Laterality: Right;  . TUBAL LIGATION     Family History  Problem Relation Age of Onset  . Diabetes Mother   . Hypertension  Mother   No bleeding disorders, clotting disorders, or aneurysms  Social History       Social History   Substance Use Topics   . Smoking status: Never Smoker   . Smokeless tobacco: Never Used   . Alcohol use 0.6 oz/week    1 Glasses of wine per week  No IV drug use      Allergies  Allergen Reactions  . Codeine Itching  . Hydrocodone Itching      REVIEW OF SYSTEMS(Negative unless checked)  Constitutional: [x] ??Weight loss[] ??Fever[] ??Chills Cardiac:[] ??Chest pain[] ??Chest pressure[] ??Palpitations [] ??Shortness of breath when laying flat [] ??Shortness of breath at rest [] ??Shortness of breath with exertion. Vascular: [] ??Pain in legs with walking[] ??Pain in legsat rest[] ??Pain in legs when laying flat [] ??Claudication [] ??Pain in feet when walking [] ??Pain in feet at rest [] ??Pain in feet when laying flat [x] ??History of DVT [x] ??Phlebitis [x] ??Swelling in legs [x] ??Varicose veins [] ??Non-healing ulcers Pulmonary: [] ??Uses home oxygen [] ??Productive cough[] ??Hemoptysis [] ??Wheeze [] ??COPD [] ??Asthma Neurologic: [] ??Dizziness [] ??Blackouts [] ??Seizures [] ??History of stroke [] ??History of TIA[] ??Aphasia [] ??Temporary blindness[] ??Dysphagia [] ??Weaknessor numbness in arms [] ??Weakness or numbnessin legs Musculoskeletal: [x] ??Arthritis [] ??Joint swelling [x] ??Joint pain [] ??Low back pain Hematologic:[] ??Easy bruising[] ??Easy bleeding [] ??Hypercoagulable state [] ??Anemic [] ??Hepatitis Gastrointestinal:[] ??Blood in stool[] ??Vomiting blood[] ??Gastroesophageal reflux/heartburn[] ??Abdominal pain Genitourinary: [] ??Chronic kidney disease [] ??Difficulturination [] ??Frequenturination [] ??Burning with urination[] ??Hematuria Skin: [] ??Rashes [] ??Ulcers [] ??Wounds Psychological: [] ??History of anxiety[] ??History of major depression.      Physical  Examination  There were no vitals taken for this visit. Gen:  WD/WN, NAD Head: /AT, No temporalis wasting. Ear/Nose/Throat: Hearing grossly intact, nares w/o erythema or drainage Eyes: Conjunctiva clear. Sclera non-icteric Neck: Supple.  Trachea midline Pulmonary:  Good air movement, no use of accessory muscles.  Cardiac: RRR, no JVD Vascular:  Vessel Right Left  Radial Palpable Palpable                          PT Palpable Palpable  DP Palpable Palpable   Gastrointestinal: soft, non-tender/non-distended. No guarding/reflex.  Musculoskeletal: M/S 5/5 throughout.  No deformity or atrophy.  Mild right lower extremity edema. Neurologic: Sensation grossly intact in extremities.  Symmetrical.  Speech is fluent.  Psychiatric: Judgment intact, Mood & affect appropriate for pt's clinical situation. Dermatologic: No rashes or ulcers noted.  No cellulitis or open wounds.  Incision is healing well       Labs Recent Results (from the past 2160 hour(s))  Surgical pcr screen     Status: None   Collection Time: 01/07/19  8:44 AM  Result Value Ref Range   MRSA, PCR NEGATIVE NEGATIVE   Staphylococcus aureus NEGATIVE NEGATIVE    Comment: (NOTE) The Xpert SA Assay (FDA approved for NASAL specimens in patients 23 years of age and older), is one component of a comprehensive surveillance program. It is not intended to diagnose infection nor to guide or monitor treatment. Performed at [facility], [address], [place], [place] [address]   Urinalysis, Routine  w reflex microscopic     Status: Abnormal   Collection Time: 01/07/19  8:44 AM  Result Value Ref Range   Color, Urine STRAW (A) YELLOW   APPearance CLEAR (A) CLEAR   Specific Gravity, Urine 1.013 1.005 - 1.030   pH 6.0 5.0 - 8.0   Glucose, UA NEGATIVE NEGATIVE mg/dL   Hgb urine dipstick NEGATIVE NEGATIVE   Bilirubin Urine NEGATIVE NEGATIVE   Ketones, ur NEGATIVE NEGATIVE mg/dL   Protein, ur NEGATIVE NEGATIVE  mg/dL   Nitrite NEGATIVE NEGATIVE   Leukocytes, UA NEGATIVE NEGATIVE    Comment: Performed at [facility], [address], [place], [place] [address]  Urine culture     Status: None   Collection Time: 01/07/19  8:44 AM  Result Value Ref Range   Specimen Description      URINE, RANDOM Performed at [facility], [address], [place], [place] [address]    Special Requests      Normal Performed at [facility], [address] [place], [place] [address]    Culture      NO GROWTH Performed at [facility], [address], [place], [place] [address]    Report Status 01/08/2019 FINAL   C-reactive protein     Status: None   Collection Time: 01/07/19  8:46 AM  Result Value Ref Range   CRP <0.8 <1.0 mg/dL    Comment: Performed at [facility], [address] [place], [place] [address]  Hemoglobin A1c     Status: Abnormal   Collection Time: 01/07/19  8:46 AM  Result Value Ref Range   Hgb A1c MFr Bld 6.1 (H) 4.8 - 5.6 %    Comment: (NOTE) Pre diabetes:          5.7%-6.4% Diabetes:              >6.4% Glycemic control for   <7.0% adults with diabetes    Mean Plasma Glucose 128.37 mg/dL    Comment: Performed at [facility], [address], [place], [place] [address]  Sedimentation rate     Status: None   Collection Time: 01/07/19  8:46 AM  Result Value Ref Range   Sed Rate 20 0 - 30 mm/hr    Comment: Performed at [facility], [address] [place], [place] [address]  APTT     Status: None   Collection Time: 01/07/19  8:46 AM  Result Value Ref Range   aPTT 31 24 - 36 seconds    Comment: Performed at [facility], [address] [place], [place] [address]  CBC     Status: Abnormal   Collection Time: 01/07/19  8:46 AM  Result Value Ref Range   WBC 5.1 4.0 - 10.5 K/uL   RBC 4.02 3.87 - 5.11 MIL/uL   Hemoglobin 11.8 (L) 12.0 - 15.0 g/dL   HCT 28.4 13.2 - 44.0 %   MCV 93.5 80.0 - 100.0 fL   MCH 29.4 26.0 -  34.0 pg   MCHC 31.4 30.0 - 36.0 g/dL   RDW 10.2 72.5 - 36.6 %   Platelets 279 150 - 400 K/uL   nRBC 0.0 0.0 - 0.2 %    Comment: Performed at [facility], [address] [place], [place] [address]  Comprehensive metabolic panel     Status: Abnormal   Collection Time: 01/07/19  8:46 AM  Result Value Ref Range   Sodium 139 135 - 145 mmol/L  Potassium 4.0 3.5 - 5.1 mmol/L   Chloride 106 98 - 111 mmol/L   CO2 29 22 - 32 mmol/L   Glucose, Bld 87 70 - 99 mg/dL   BUN 16 6 - 20 mg/dL   Creatinine, Ser 7.84 0.44 - 1.00 mg/dL   Calcium 9.1 8.9 - 69.6 mg/dL   Total Protein 7.1 6.5 - 8.1 g/dL   Albumin 3.8 3.5 - 5.0 g/dL   AST 22 15 - 41 U/L   ALT 15 0 - 44 U/L   Alkaline Phosphatase 84 38 - 126 U/L   Total Bilirubin 0.6 0.3 - 1.2 mg/dL   GFR calc non Af Amer >60 >60 mL/min   GFR calc Af Amer >60 >60 mL/min   Anion gap 4 (L) 5 - 15    Comment: Performed at [facility], [address], [place], [place] [address]  Protime-INR     Status: None   Collection Time: 01/07/19  8:46 AM  Result Value Ref Range   Prothrombin Time 13.0 11.4 - 15.2 seconds   INR 0.99     Comment: Performed at [facility], [address], [place], [place] [address]  Type and screen Order type and screen if day of surgery is less than 15 days from draw of preadmission visit or order morning of surgery if day of surgery is greater than 6 days from preadmission visit.     Status: None   Collection Time: 01/07/19  8:46 AM  Result Value Ref Range   ABO/RH(D) A POS    Antibody Screen NEG    Sample Expiration 01/21/2019    Extend sample reason      NO TRANSFUSIONS OR PREGNANCY IN THE PAST 3 MONTHS Performed at [facility], [address], [place], [place] [address]   ABO/Rh     Status: None   Collection Time: 01/21/19 10:19 AM  Result Value Ref Range   ABO/RH(D)      A POS Performed at [facility], [address] [place], [place] [address]     Radiology No  results found.  Assessment/Plan Diabetes mellitus, type 2 blood glucose control important in reducing the progression of atherosclerotic disease. Also, involved in wound healing. On appropriate medications.   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  May also have some lymphedema.  Symptoms have been reasonably stable.  Total knee replacement status Now finished surgery and doing well with rehab  Essential (primary) hypertension blood pressure control important in reducing the progression of atherosclerotic disease. On appropriate oral medications.   S/P IVC filter Duplex today shows no evidence of DVT in either lower extremity.  She is beyond the typical risk of developing a DVT at this point following her knee replacement.  She would like to go ahead and get her filter out while she is still out on leave from work.  We discussed that with the ongoing coronavirus situation, some patients have preferred to wait several weeks to allow that pass.  There is some risk the longer the filter is in the harder it will be to get it out, so she would like to go ahead and get this out next week.  Risks and benefits were discussed and she is agreeable to proceed.    Festus Barren, MD  03/03/2019 10:38 AM    This note was created with Dragon medical transcription system.  Any errors from dictation are purely unintentional

## 2019-03-03 NOTE — Assessment & Plan Note (Signed)
Duplex today shows no evidence of DVT in either lower extremity.  She is beyond the typical risk of developing a DVT at this point following her knee replacement.  She would like to go ahead and get her filter out while she is still out on leave from work.  We discussed that with the ongoing coronavirus situation, some patients have preferred to wait several weeks to allow that pass.  There is some risk the longer the filter is in the harder it will be to get it out, so she would like to go ahead and get this out next week.  Risks and benefits were discussed and she is agreeable to proceed.

## 2019-03-03 NOTE — Patient Instructions (Signed)
Inferior Vena Cava Filter Removal  Inferior vena cava filter removal is a procedure to take out a metal filter that was placed into a large vein in the abdomen (inferior vena cava, IVC). An IVC filter prevents blood clots in the legs or pelvis from traveling to the heart or lungs. Some IVC filters are designed to be removed (retrievable filters). You may have your filter removed when the danger of forming blood clots has passed or when you can take blood-thinning medicine to prevent blood clots. In some cases, the filter may need to be removed because it becomes damaged, is not working, or is causing problems. Most filters can be removed through the vein (percutaneous). In the rare cases when a surgeon is unable to remove the filter percutaneously, one of these steps may be taken:  A more invasive, open surgery may be necessary.  The filter may be left in place. Tell a health care provider about:  Any allergies you have.  All medicines you are taking, including vitamins, herbs, eye drops, creams, and over-the-counter medicines.  Any problems you or family members have had with anesthetic medicines or with contrast dyes that are used during an imaging test.  Any blood disorders you have.  Any surgeries you have had.  Any medical conditions you have.  Whether you are pregnant or may be pregnant. What are the risks? Generally, this is a safe procedure. However, problems may occur, including:  Infection.  Bleeding.  Allergic reactions to medicines or dyes.  Damage to the IVC, other blood vessels, or surrounding structures.  A blood clot or a piece of the filter breaking loose and traveling to the heart or lungs. What happens before the procedure? Medicines Ask your health care provider about:  Changing or stopping your regular medicines. This is especially important if you are taking diabetes medicines or blood thinners.  Taking medicines such as aspirin and ibuprofen. These  medicines can thin your blood. Do not take these medicines unless your health care provider tells you to take them.  Taking over-the-counter medicines, vitamins, herbs, and supplements. Staying hydrated Follow instructions from your health care provider about hydration, which may include:  Up to 2 hours before the procedure - you may continue to drink clear liquids, such as water or clear fruit juice. Eating and drinking restrictions Follow instructions from your health care provider about eating and drinking, which may include:  8 hours before the procedure - stop eating heavy meals or foods such as meat, fried foods, or fatty foods.  6 hours before the procedure - stop eating light meals or foods, such as toast or cereal.  6 hours before the procedure - stop drinking milk or drinks that contain milk.  2 hours before the procedure - stop drinking clear liquids. General instructions  Plan to have someone take you home from the hospital or clinic.  Plan to have a responsible adult care for you for at least 24 hours after you leave the hospital or clinic. This is important. What happens during the procedure?  To lower your risk of infection: ? Your health care team will wash or sanitize their hands. ? Hair may be removed from the surgical area. ? Your skin will be washed with soap.  An IV will be inserted into one of your veins.  You will be given one or more of the following: ? A medicine to help you relax (sedative). ? A medicine to make you fall asleep (general anesthetic).  The   procedure will be done through a vein in your groin or neck that leads to the IVC. Your health care provider will inject a numbing medicine (local anesthetic) into the skin over the vein that will be used.  A small incision will be made over the vein.  A long, thin tube (catheter) will be inserted into the vein.  The catheter will be moved through your vein and into your IVC. X-rays may be done to  help guide the catheter into place. Dye may be injected through the catheter before the X-rays to make the catheter and filter easier to see.  When the catheter reaches the filter, a hook (snare) on the end of the catheter may be used to latch onto the filter. In some cases, a grasping instrument (forceps) may be threaded through the catheter to gently grab and remove the filter instead.  After the filter has been hooked or grasped, the filter and instruments will be pulled out through the catheter.  The catheter will be removed through the incision in your skin.  Pressure will be placed over your incision until bleeding stops.  A bandage (dressing) will be placed over your incision. The procedure may vary among health care providers and hospitals. What happens after the procedure?  Your blood pressure, heart rate, breathing rate, and blood oxygen level will be monitored until the medicines you were given have worn off.  Do not drive for 24 hours if you were given a sedative during your procedure.  You may need to stay in bed (be on bed rest) for a period of time. Summary  Inferior vena cava (IVC) filter removal is a procedure to take out a filter that was placed to prevent blood clots from traveling to your heart or lungs.  You may have your filter removed when the danger of forming blood clots has passed or when you can take blood-thinning medicines to prevent blood clots. In some cases, a filter is removed because there is a problem with it.  The removal procedure is similar to the procedure that was used to insert the filter. A long, thin tube (catheter) will be inserted through a vein in your groin or neck. Then, the filter will be gently grasped and pulled out through the catheter.  Plan to have a responsible adult care for you for at least 24 hours after you leave the hospital or clinic. This is important. This information is not intended to replace advice given to you by your  health care provider. Make sure you discuss any questions you have with your health care provider. Document Released: 06/04/2017 Document Revised: 06/04/2017 Document Reviewed: 06/04/2017 Elsevier Interactive Patient Education  2019 [employer]  

## 2019-03-03 NOTE — Assessment & Plan Note (Signed)
blood pressure control important in reducing the progression of atherosclerotic disease. On appropriate oral medications.  

## 2019-03-03 NOTE — Telephone Encounter (Signed)
Patient was scheduled to have her IVC filter removed on 03/09/2019 but I was told to reschedule the patient to the end of April or first of May. Patient was called and this was explained.

## 2019-03-09 ENCOUNTER — Ambulatory Visit: Admit: 2019-03-09 | Payer: BLUE CROSS/BLUE SHIELD | Admitting: Vascular Surgery

## 2019-03-09 SURGERY — IVC FILTER REMOVAL
Anesthesia: Moderate Sedation

## 2019-04-07 ENCOUNTER — Encounter (INDEPENDENT_AMBULATORY_CARE_PROVIDER_SITE_OTHER): Payer: Self-pay

## 2019-04-10 ENCOUNTER — Other Ambulatory Visit (INDEPENDENT_AMBULATORY_CARE_PROVIDER_SITE_OTHER): Payer: Self-pay | Admitting: Nurse Practitioner

## 2019-04-15 ENCOUNTER — Other Ambulatory Visit
Admission: RE | Admit: 2019-04-15 | Discharge: 2019-04-15 | Disposition: A | Discharge status: Home / Self Care | Payer: BLUE CROSS/BLUE SHIELD | Source: Ambulatory Visit | Attending: Vascular Surgery | Admitting: Vascular Surgery

## 2019-04-15 ENCOUNTER — Other Ambulatory Visit: Payer: Self-pay

## 2019-04-15 DIAGNOSIS — Z1159 Encounter for screening for other viral diseases: Secondary | ICD-10-CM | POA: Insufficient documentation

## 2019-04-15 LAB — SARS CORONAVIRUS 2 BY RT PCR (HOSPITAL ORDER, PERFORMED IN ~~LOC~~ HOSPITAL LAB): SARS Coronavirus 2: NEGATIVE

## 2019-04-15 MED ORDER — CEFAZOLIN SODIUM-DEXTROSE 2-4 GM/100ML-% IV SOLN
2.0000 g | Freq: Once | INTRAVENOUS | Status: AC
  Administered 2019-04-16: 2 g via INTRAVENOUS

## 2019-04-16 ENCOUNTER — Encounter
Admission: RE | Disposition: A | Discharge status: Home / Self Care | Payer: Self-pay | Source: Home / Self Care | Attending: Vascular Surgery

## 2019-04-16 ENCOUNTER — Other Ambulatory Visit: Payer: Self-pay

## 2019-04-16 ENCOUNTER — Ambulatory Visit
Admission: RE | Admit: 2019-04-16 | Discharge: 2019-04-16 | Disposition: A | Discharge status: Home / Self Care | Payer: BLUE CROSS/BLUE SHIELD | Attending: Vascular Surgery | Admitting: Vascular Surgery

## 2019-04-16 DIAGNOSIS — I82409 Acute embolism and thrombosis of unspecified deep veins of unspecified lower extremity: Secondary | ICD-10-CM

## 2019-04-16 DIAGNOSIS — Z95828 Presence of other vascular implants and grafts: Secondary | ICD-10-CM

## 2019-04-16 DIAGNOSIS — Z885 Allergy status to narcotic agent status: Secondary | ICD-10-CM | POA: Diagnosis not present

## 2019-04-16 DIAGNOSIS — E119 Type 2 diabetes mellitus without complications: Secondary | ICD-10-CM | POA: Insufficient documentation

## 2019-04-16 DIAGNOSIS — I10 Essential (primary) hypertension: Secondary | ICD-10-CM | POA: Insufficient documentation

## 2019-04-16 DIAGNOSIS — Z9884 Bariatric surgery status: Secondary | ICD-10-CM | POA: Diagnosis not present

## 2019-04-16 DIAGNOSIS — Z833 Family history of diabetes mellitus: Secondary | ICD-10-CM | POA: Insufficient documentation

## 2019-04-16 DIAGNOSIS — Z86711 Personal history of pulmonary embolism: Secondary | ICD-10-CM | POA: Diagnosis not present

## 2019-04-16 DIAGNOSIS — Z452 Encounter for adjustment and management of vascular access device: Secondary | ICD-10-CM | POA: Diagnosis present

## 2019-04-16 DIAGNOSIS — Z8249 Family history of ischemic heart disease and other diseases of the circulatory system: Secondary | ICD-10-CM | POA: Insufficient documentation

## 2019-04-16 DIAGNOSIS — M1990 Unspecified osteoarthritis, unspecified site: Secondary | ICD-10-CM | POA: Diagnosis not present

## 2019-04-16 DIAGNOSIS — J45909 Unspecified asthma, uncomplicated: Secondary | ICD-10-CM | POA: Insufficient documentation

## 2019-04-16 DIAGNOSIS — Z9851 Tubal ligation status: Secondary | ICD-10-CM | POA: Diagnosis not present

## 2019-04-16 DIAGNOSIS — Z86718 Personal history of other venous thrombosis and embolism: Secondary | ICD-10-CM | POA: Diagnosis not present

## 2019-04-16 DIAGNOSIS — K219 Gastro-esophageal reflux disease without esophagitis: Secondary | ICD-10-CM | POA: Diagnosis not present

## 2019-04-16 DIAGNOSIS — Z96651 Presence of right artificial knee joint: Secondary | ICD-10-CM | POA: Insufficient documentation

## 2019-04-16 HISTORY — PX: IVC FILTER REMOVAL: CATH118246

## 2019-04-16 SURGERY — IVC FILTER REMOVAL
Anesthesia: Moderate Sedation

## 2019-04-16 MED ORDER — SODIUM CHLORIDE 0.9 % IV SOLN
INTRAVENOUS | Status: DC
  Administered 2019-04-16: 08:00:00 via INTRAVENOUS

## 2019-04-16 MED ORDER — ONDANSETRON HCL 4 MG/2ML IJ SOLN: 4.0000 mg | Freq: Four times a day (QID) | INTRAMUSCULAR | Status: DC | PRN

## 2019-04-16 MED ORDER — MIDAZOLAM HCL 2 MG/2ML IJ SOLN
INTRAMUSCULAR | Status: DC | PRN
  Administered 2019-04-16: 2 mg via INTRAVENOUS
  Administered 2019-04-16: 0.5 mg via INTRAVENOUS

## 2019-04-16 MED ORDER — FENTANYL CITRATE (PF) 100 MCG/2ML IJ SOLN
INTRAMUSCULAR | Status: DC | PRN
  Administered 2019-04-16: 50 ug via INTRAVENOUS
  Administered 2019-04-16: 25 ug via INTRAVENOUS

## 2019-04-16 MED ORDER — MIDAZOLAM HCL 5 MG/5ML IJ SOLN
INTRAMUSCULAR | Status: AC
  Filled 2019-04-16: qty 5

## 2019-04-16 MED ORDER — FENTANYL CITRATE (PF) 100 MCG/2ML IJ SOLN: 12.5000 ug | Freq: Once | INTRAMUSCULAR | Status: DC | PRN

## 2019-04-16 MED ORDER — DIPHENHYDRAMINE HCL 50 MG/ML IJ SOLN
INTRAMUSCULAR | Status: AC
  Filled 2019-04-16: qty 1

## 2019-04-16 MED ORDER — FENTANYL CITRATE (PF) 100 MCG/2ML IJ SOLN
INTRAMUSCULAR | Status: AC
  Filled 2019-04-16: qty 2

## 2019-04-16 SURGICAL SUPPLY — 3 items
FILTER VC CELECT-FEMORAL (Filter) IMPLANT
PACK ANGIOGRAPHY (CUSTOM PROCEDURE TRAY) ×2 IMPLANT
SET VENACAVA FILTER RETRIEVAL (MISCELLANEOUS) ×2 IMPLANT

## 2019-04-16 NOTE — H&P (Signed)
[facility] Admission History & Physical  MRN : [id number]  Erica Powers is a 58 y.o. (1961-08-03) female who presents with chief complaint of No chief complaint on file. Marland Kitchen  History of Present Illness: Patient presents today for IVC filter retrieval.  She is status post knee replacement and had a filter placed with a previous history of DVT.  She did well from her knee replacement and has done rehabilitation well.  No complaints today.  Current Facility-Administered Medications  Medication Dose Route Frequency Provider Last Rate Last Dose  . 0.9 %  sodium chloride infusion   Intravenous Continuous Georgiana Spinner, NP 75 mL/hr at 04/16/19 0815    . ceFAZolin (ANCEF) IVPB 2g/100 mL premix  2 g Intravenous Once Sheppard Plumber E, NP      . fentaNYL (SUBLIMAZE) 100 MCG/2ML injection           . fentaNYL (SUBLIMAZE) injection 12.5 mcg  12.5 mcg Intravenous Once PRN Georgiana Spinner, NP      . midazolam (VERSED) 5 MG/5ML injection           . ondansetron (ZOFRAN) injection 4 mg  4 mg Intravenous Q6H PRN Georgiana Spinner, NP        Past Medical History:  Diagnosis Date  . Arthritis   . Asthma   . Difficult intubation    pt denies  . DVT, lower extremity (HCC)    right leg that went to lung  . GERD (gastroesophageal reflux disease)   . History of hiatal hernia   . Pulmonary embolism [facility])     Past Surgical History:  Procedure Laterality Date  . BREAST BIOPSY Bilateral    bilat bx done neg at [facility]   . BREAST SURGERY  2000   Reduction  . CHOLECYSTECTOMY    . COLONOSCOPY N/A 04/08/2015   Procedure: COLONOSCOPY;  Surgeon: Midge Minium, MD;  Location: [facility];  Service: Gastroenterology;  Laterality: N/A;  . ESOPHAGOGASTRODUODENOSCOPY (EGD) WITH PROPOFOL N/A 07/07/2015   Procedure: ESOPHAGOGASTRODUODENOSCOPY (EGD) WITH PROPOFOL;  Surgeon: Midge Minium, MD;  Location: [facility];  Service: Endoscopy;  Laterality: N/A;  . GASTRIC ROUX-EN-Y N/A  11/01/2015   Procedure: LAPAROSCOPIC ROUX-EN-Y GASTRIC, HIATAL HERNIA REPAIR, EXTENSIVE LYSIS OF ADHESIONS;  Surgeon: Everette Rank, MD;  Location: [facility] ORS;  Service: General;  Laterality: N/A;  . HEEL SPUR SURGERY Bilateral   . IVC FILTER INSERTION N/A 01/19/2019   Procedure: IVC FILTER INSERTION;  Surgeon: Annice Needy, MD;  Location: [facility] INVASIVE CV LAB;  Service: Cardiovascular;  Laterality: N/A;  . IVC FILTER REMOVAL  04/16/2019  . REDUCTION MAMMAPLASTY Bilateral 2000  . ROTATOR CUFF REPAIR Right 2009  . TONSILLECTOMY    . TOTAL KNEE ARTHROPLASTY Right 01/21/2019   Procedure: TOTAL KNEE ARTHROPLASTY with navigation;  Surgeon: Donato Heinz, MD;  Location: [facility] ORS;  Service: Orthopedics;  Laterality: Right;  . TUBAL LIGATION      Social History Social History   Tobacco Use  . Smoking status: Never Smoker  . Smokeless tobacco: Never Used  Substance Use Topics  . Alcohol use: Not Currently  . Drug use: No    Family History Family History  Problem Relation Age of Onset  . Diabetes Mother   . Hypertension Mother   . Breast cancer Neg Hx     Allergies  Allergen Reactions  . Codeine Itching  . Hydrocodone Itching     REVIEW OF SYSTEMS (Negative unless checked)  Constitutional: Weight loss  Fever  Chills Cardiac: Chest pain   Chest pressure   Palpitations   Shortness of breath when laying flat   Shortness of breath at rest   Shortness of breath with exertion. Vascular:  Pain in legs with walking   Pain in legs at rest   Pain in legs when laying flat   Claudication   Pain in feet when walking  Pain in feet at rest  Pain in feet when laying flat   History of DVT   Phlebitis   Swelling in legs   Varicose veins   Non-healing ulcers Pulmonary:   Uses home oxygen   Productive cough   Hemoptysis   Wheeze  COPD   Asthma Neurologic:  Dizziness  Blackouts   Seizures   History of stroke   History of TIA  Aphasia    Temporary blindness   Dysphagia   Weakness or numbness in arms   Weakness or numbness in legs Musculoskeletal:  Arthritis   Joint swelling   Joint pain   Low back pain Hematologic:  Easy bruising  Easy bleeding   Hypercoagulable state   Anemic  Hepatitis Gastrointestinal:  Blood in stool   Vomiting blood  Gastroesophageal reflux/heartburn   Difficulty swallowing. Genitourinary:  Chronic kidney disease   Difficult urination  Frequent urination  Burning with urination   Blood in urine Skin:  Rashes   Ulcers   Wounds Psychological:  History of anxiety    History of major depression.  Physical Examination  Vitals:   04/16/19 0840 04/16/19 0845 04/16/19 0850 04/16/19 0855  BP: 110/76 (!) 198/94 (!) 154/82 (!) 148/74  Pulse: (!) 57 93 76 64  Resp: (!) 25  Temp:      TempSrc:      SpO2: 100% 100% 100% 100%  Weight:      Height:       Body mass index is 34.28 kg/m. Gen: WD/WN, NAD Head: [place]/AT, No temporalis wasting.  Ear/Nose/Throat: Hearing grossly intact, nares w/o erythema or drainage, oropharynx w/o Erythema/Exudate,  Eyes: Conjunctiva clear, sclera non-icteric Neck: Trachea midline.  No JVD.  Pulmonary:  Good air movement, respirations not labored, no use of accessory muscles.  Cardiac: RRR, normal S1, S2. Vascular:  Vessel Right Left  Radial Palpable Palpable   Musculoskeletal: M/S 5/5 throughout.  Extremities without ischemic changes.  No deformity or atrophy.  Neurologic: Sensation grossly intact in extremities.  Symmetrical.  Speech is fluent. Motor exam as listed above. Psychiatric: Judgment intact, Mood & affect appropriate for pt's clinical situation. Dermatologic: No rashes or ulcers noted.  No cellulitis or open wounds.      CBC Lab Results  Component Value Date   WBC 5.1 01/07/2019   HGB 11.8 (L) 01/07/2019   HCT 37.6 01/07/2019   MCV 93.5 01/07/2019   PLT 279 01/07/2019    BMET     Component Value Date/Time   NA 139 01/07/2019 0846   NA 140 05/04/2013 0842   K 4.0 01/07/2019 0846   K 3.6 05/04/2013 0842   CL 106 01/07/2019 0846   CL 107 05/04/2013 0842   CO2 29 01/07/2019 0846   CO2 30 05/04/2013 0842   GLUCOSE 87 01/07/2019 0846   GLUCOSE 100 (H) 05/04/2013 0842   BUN 16 01/07/2019 0846   BUN 12 05/04/2013 0842   CREATININE 0.77 01/07/2019 0846   CREATININE 0.79 05/04/2013 0842   CALCIUM 9.1 01/07/2019 0846   CALCIUM 8.9 05/04/2013 0842  GFRNONAA >60 01/07/2019 0846   GFRNONAA >60 05/04/2013 0842   GFRAA >60 01/07/2019 0846   GFRAA >60 05/04/2013 0842   CrCl cannot be calculated (Patient's most recent lab result is older than the maximum 21 days allowed.).  COAG Lab Results  Component Value Date   INR 0.99 01/07/2019   INR 1.17 11/15/2015    Radiology No results found.   Assessment/Plan Diabetes mellitus, type 2 blood glucose control important in reducing the progression of atherosclerotic disease. Also, involved in wound healing. On appropriate medications.   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 May also have some lymphedema. Symptoms have been reasonably stable.  Total knee replacement status Now finished surgery and doing well with rehab  Essential (primary) hypertension blood pressure control important in reducing the progression of atherosclerotic disease. On appropriate oral medications.  S/P IVC filter Here today for removal.  Risks and benefits discussed.   Festus BarrenJason , MD  04/16/2019 9:02 AM

## 2019-04-16 NOTE — H&P (Signed)
[facility] History & Physical Update  The patient was interviewed and re-examined.  The patient's previous History and Physical has been reviewed and is unchanged.  There is no change in the plan of care. We plan to proceed with the scheduled procedure.  Festus Barren, MD  04/16/2019, 8:10 AM

## 2019-04-16 NOTE — Op Note (Signed)
    OPERATIVE NOTE   PROCEDURE: 1. Ultrasound guidance for vascular access to right jugular vein. 2. Catheter placement into IVC from right jugular vein. 3. Inferior venacavogram. 4. Attempted retrieval of IVC filter, unsuccessful   PRE-OPERATIVE DIAGNOSIS: 1. Status post IVC filter for previous DVT    POST-OPERATIVE DIAGNOSIS: Same as above  SURGEON: Festus Barren, MD  ASSISTANT(S): None  ANESTHESIA: local with moderate conscious sedation for approximately 15 minutes using 2.5 mg of Versed and 75 mcg of Fentanyl  ESTIMATED BLOOD LOSS: 10 cc  FINDING(S): 1. IVC was widely patent but the hook on the top of the filter was incorporated into the right caval wall.  SPECIMEN(S): None  INDICATIONS:  Patient is a 58 y.o.female who presents with a previous history of IVC filter placement for DVT and recent knee replacement.  The patient desires removal of the filter to avoid the small lifetime risks of perforation, infection, thrombosis, migration, and stent fracture. This was delayed about two months due to COVID 19 restrictions. Risks and benefits of removal were discussed and the patient is agreeable to proceed.  The patient understands that the filter may not be able to be retrieved if the top of the filter has embedded in the caval wall.   DESCRIPTION: After obtaining full informed written consent, the patient was brought back to the operating room and placed supine upon the operating table. Moderate conscious sedation was administered during a face to face encounter with the patient throughout the procedure with my supervision of the RN administering medicines and monitoring the patient's vital signs, pulse oximetry, telemetry and mental status throughout from the start of the procedure until the patient was taken to the recovery room. After obtaining adequate sedation, the patient was prepped and draped in the standard fashion. The right jugular vein was visualized with ultrasound and  found to be widely patent. This was accessed under direct ultrasound guidance with a Seldinger needle and a permanent image was recorded. A J-wire was then placed. After skin nick and dilatation, the retrieval sheath was placed over the wire into the inferior vena cava. Inferior venacavogram was then performed. The IVC was found to be widely patent but the filter was tilted with the top hook up against the right caval sidewall.  I attempted to retrieve this was various snares but it was clear that the hook had become embedded in the caval wall and retrieval would be quite complex.  Given the patient's hysteria and continuous motion it was clear that extensive attempts at retrieving the filter would be impossible today.  The sheath was then removed and pressure was held on the neck. The patient tolerated the procedure well and was taken to the recovery room in stable condition.  COMPLICATIONS: None  CONDITION: Stable    Festus Barren 04/16/2019 9:09 AM  This note was created with Dragon Medical transcription system. Any errors in dictation are purely unintentional.

## 2019-04-16 NOTE — Progress Notes (Signed)
Dr. [name] at bedside to speak with pt. Post procedure. Pt. Verbalized understanding.

## 2019-04-17 ENCOUNTER — Telehealth (INDEPENDENT_AMBULATORY_CARE_PROVIDER_SITE_OTHER): Payer: Self-pay | Admitting: Vascular Surgery

## 2019-04-17 NOTE — Telephone Encounter (Signed)
Patient is aware of the below and the RX called in to [facility] in [place] per patient preference. AS, CMA

## 2019-04-17 NOTE — Telephone Encounter (Signed)
Patient calling stating she is in pain from procedure done 04/16/19 and would like something for the pain. She said she has been taking Tylenol and its not helping. Please advise. AS, CMA

## 2019-04-20 DIAGNOSIS — M25511 Pain in right shoulder: Secondary | ICD-10-CM | POA: Insufficient documentation

## 2019-04-24 ENCOUNTER — Telehealth: Payer: Self-pay

## 2019-04-24 DIAGNOSIS — Z20822 Contact with and (suspected) exposure to covid-19: Secondary | ICD-10-CM

## 2019-04-24 NOTE — Telephone Encounter (Signed)
rec'd call from  [facility].  Requested to schedule pt. For COVID 19 test, due to exposure to Coronavirus, and hx of Asthma.  Stated the pt. Is requesting to be scheduled on 5/26, due to trying to  complete 14 day quarantine.  Stated the pt. Is asymptomatic at this time.     Phone call to pt.  Scheduled appt. for COVID 19 test on 04/28/19 @ 9:45 AM.  Advised to wear a mask, drive up to the site, remain in car, and advised to follow directions by staff.  Verb. Understanding.

## 2019-04-28 ENCOUNTER — Other Ambulatory Visit: Payer: BLUE CROSS/BLUE SHIELD

## 2019-04-28 DIAGNOSIS — Z20822 Contact with and (suspected) exposure to covid-19: Secondary | ICD-10-CM

## 2019-04-29 LAB — NOVEL CORONAVIRUS, NAA: SARS-CoV-2, NAA: NOT DETECTED

## 2019-05-06 DIAGNOSIS — M7551 Bursitis of right shoulder: Secondary | ICD-10-CM | POA: Insufficient documentation

## 2019-09-16 DIAGNOSIS — Z9884 Bariatric surgery status: Secondary | ICD-10-CM | POA: Insufficient documentation

## 2019-10-08 DIAGNOSIS — R1084 Generalized abdominal pain: Secondary | ICD-10-CM | POA: Insufficient documentation

## 2020-01-18 ENCOUNTER — Other Ambulatory Visit: Payer: Self-pay | Admitting: Family Medicine

## 2020-01-18 DIAGNOSIS — Z1231 Encounter for screening mammogram for malignant neoplasm of breast: Secondary | ICD-10-CM

## 2020-03-23 DIAGNOSIS — M793 Panniculitis, unspecified: Secondary | ICD-10-CM | POA: Insufficient documentation

## 2020-05-17 IMAGING — MG MM DIGITAL SCREENING BILAT W/ TOMO W/ CAD
8 of 14 series · 8 of 40 positions shown · non-contrast
Comparison: Previous exam(s).

ACR Breast Density Category a: The breast tissue is almost entirely
fatty.

CLINICAL DATA: Screening.

EXAM:
DIGITAL SCREENING BILATERAL MAMMOGRAM WITH TOMO AND CAD

[R CV synth-2D]
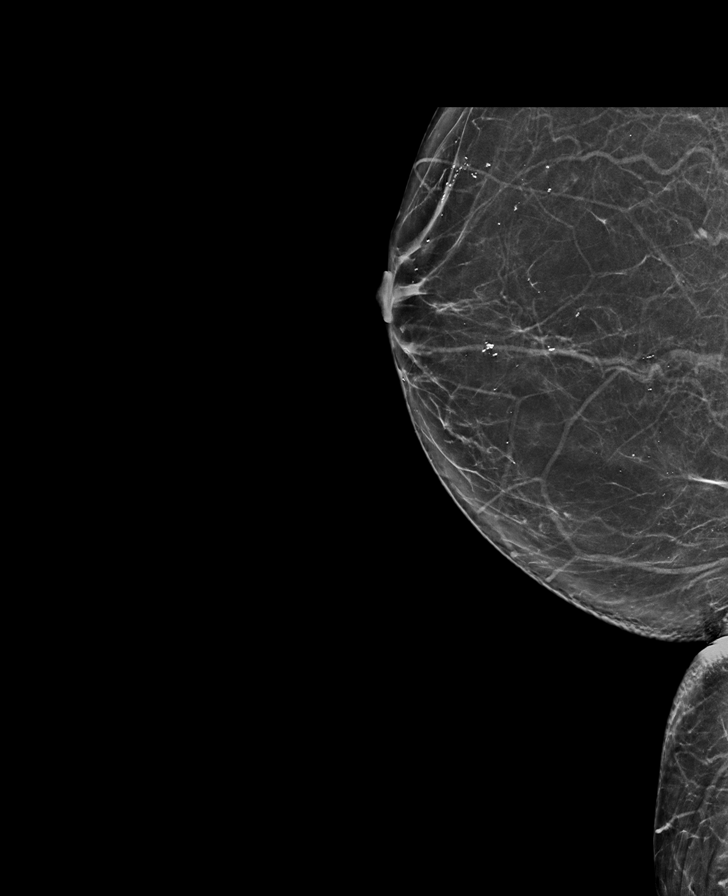

[L CV synth-2D]
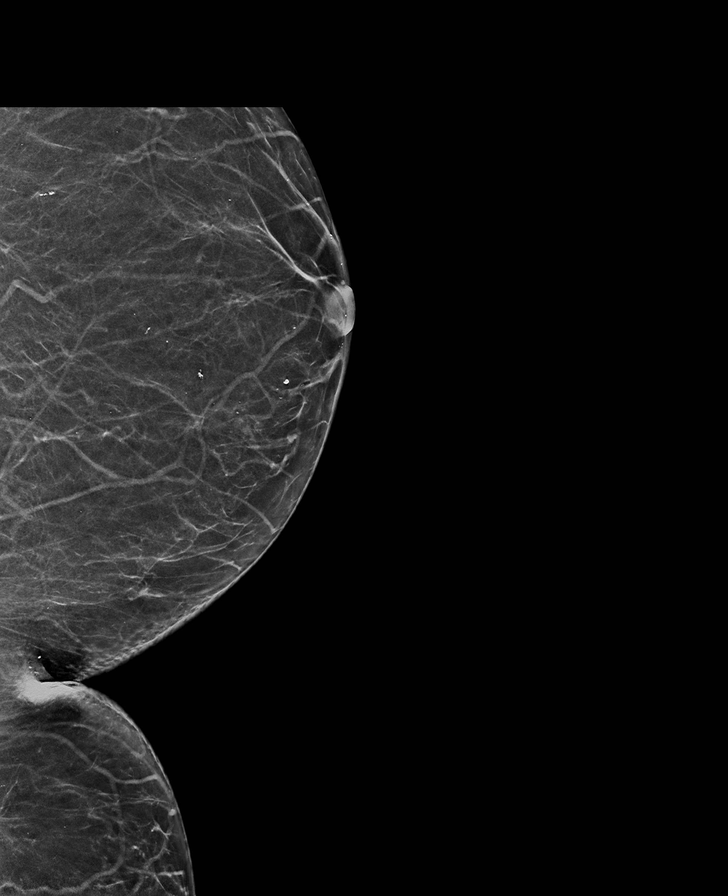

[L MLO synth-2D (1 of 2)]
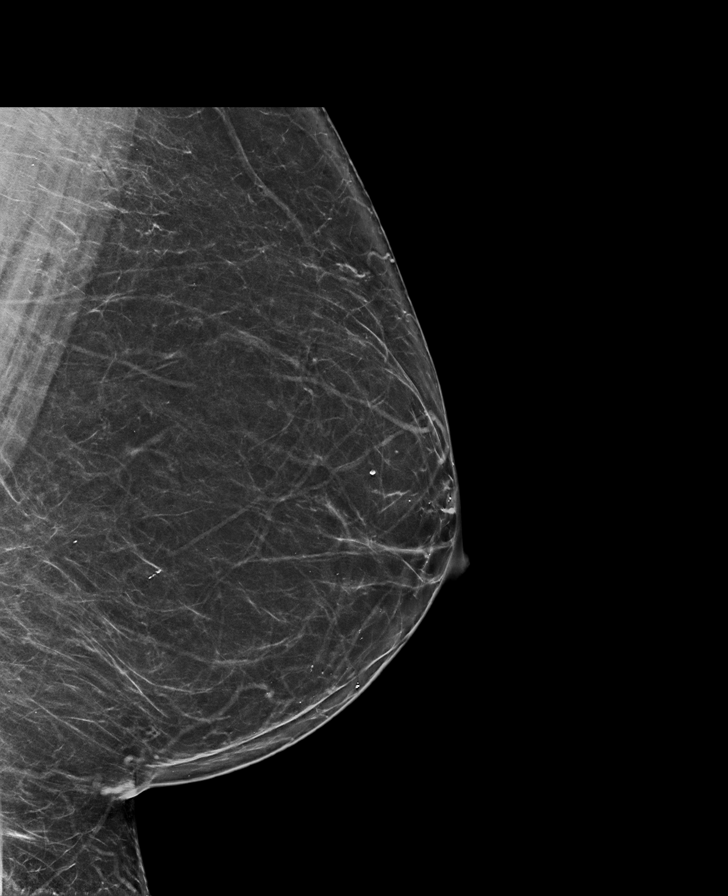

[R MLO synth-2D]
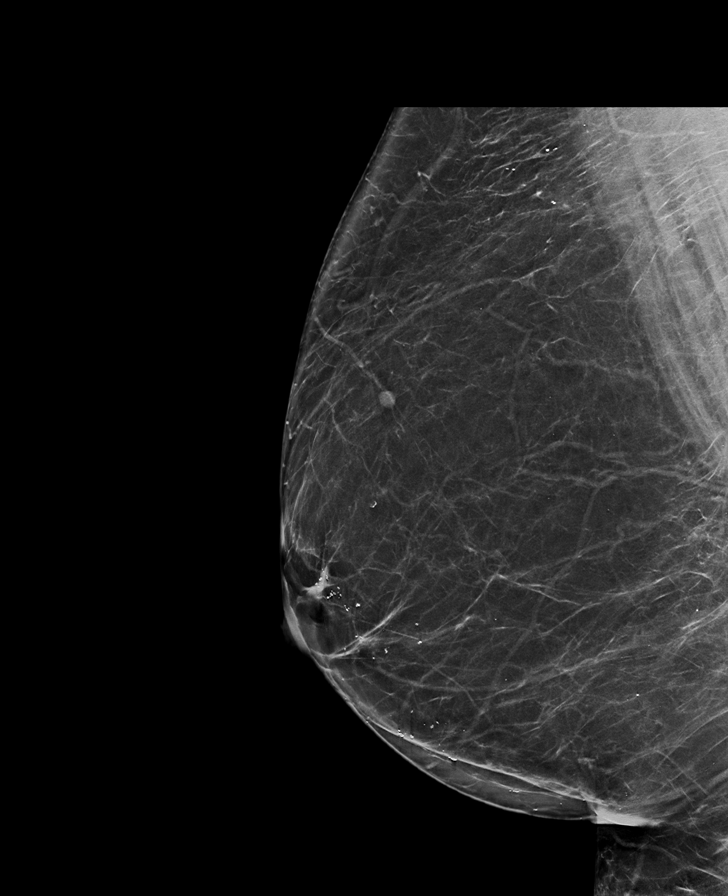

[L CC synth-2D]
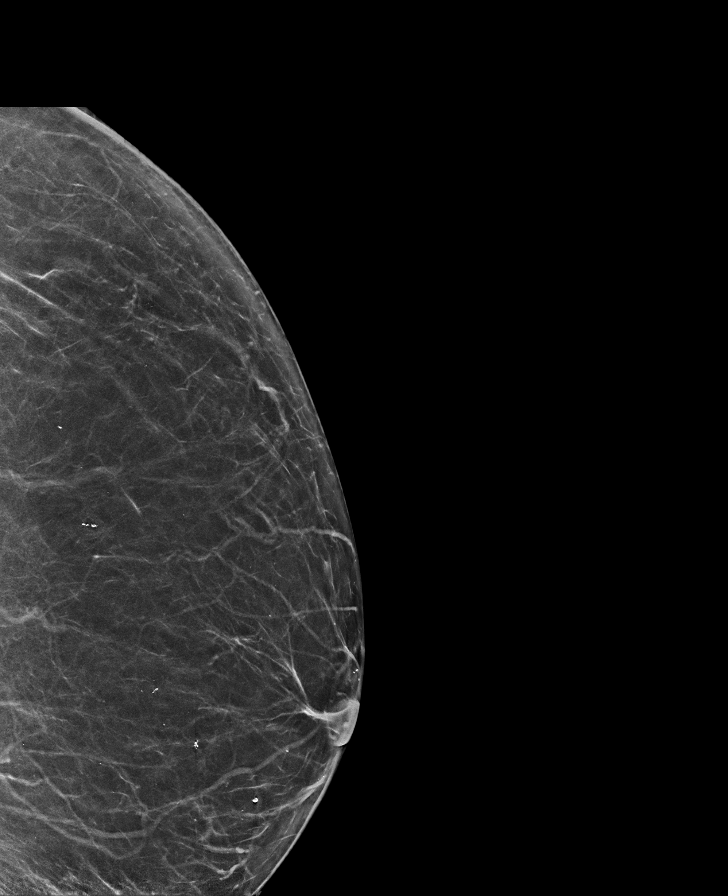

[L MLO synth-2D (2 of 2)]
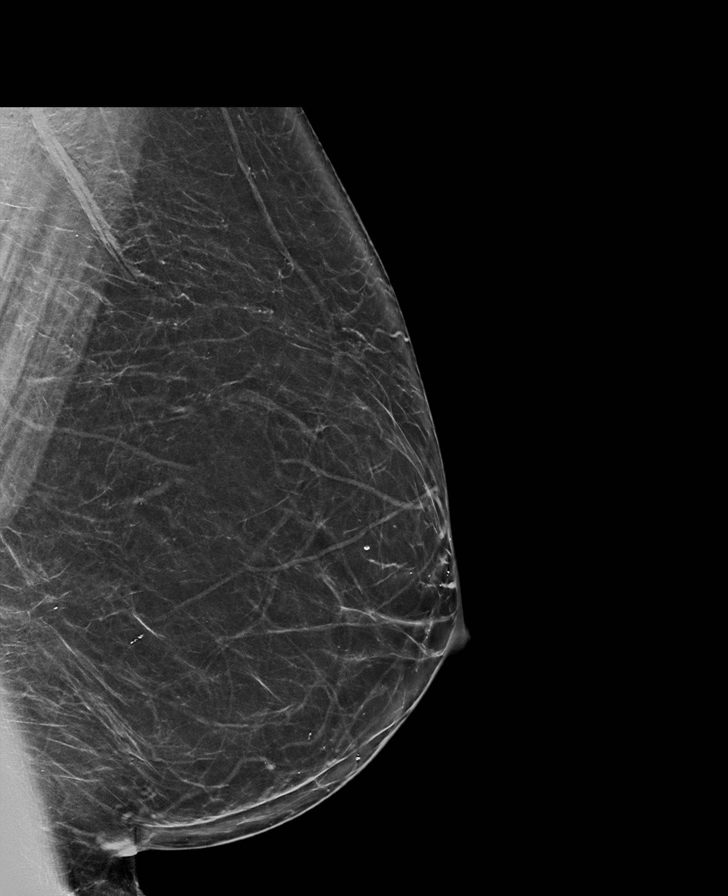

[R CC synth-2D]
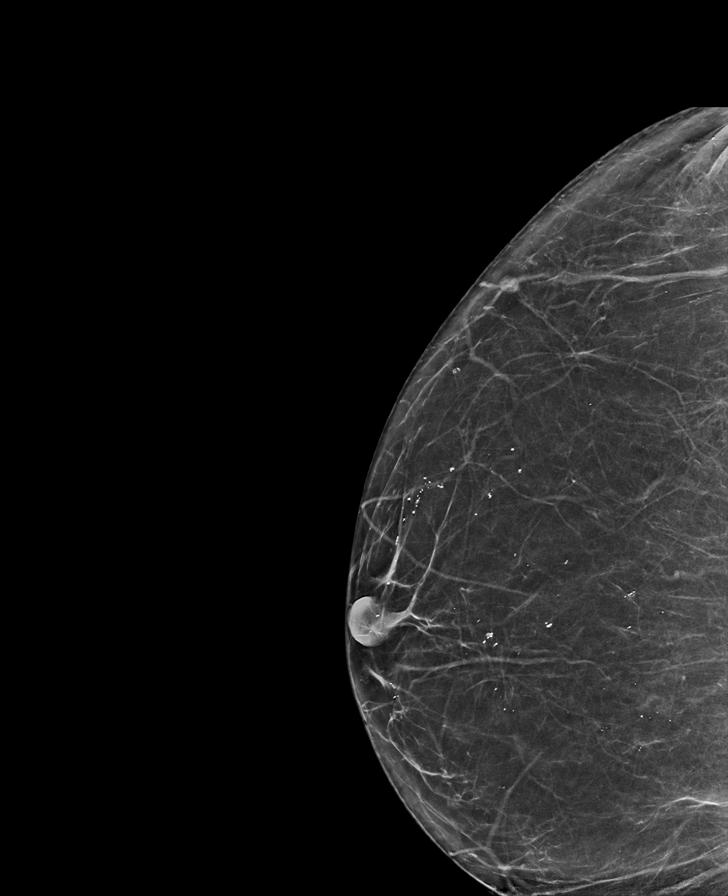

[R CV tomo · tomo slice 41/80.0]
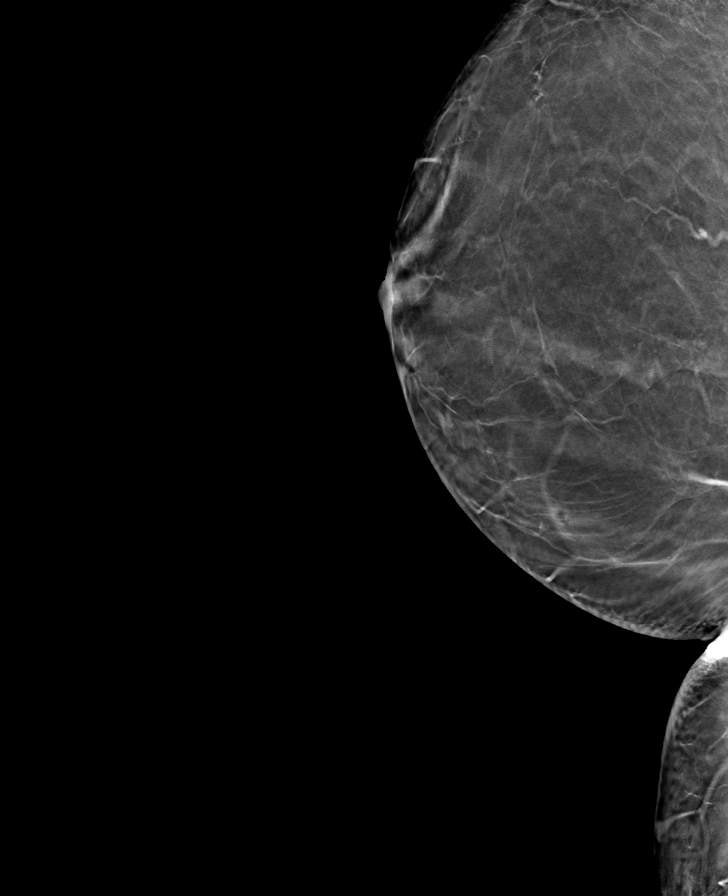

[8 of 40 positions shown; findings below may reference images not displayed]

FINDINGS: There are no findings suspicious for malignancy. Images were
processed with CAD.
IMPRESSION: No mammographic evidence of malignancy. A result letter of this
screening mammogram will be mailed directly to the patient.

RECOMMENDATION:
Screening mammogram in one year. (Code:8Y-[id number])

BI-RADS CATEGORY  1: Negative.

## 2020-07-05 ENCOUNTER — Other Ambulatory Visit: Payer: Self-pay

## 2020-07-21 ENCOUNTER — Ambulatory Visit
Admission: RE | Admit: 2020-07-21 | Discharge: 2020-07-21 | Disposition: A | Discharge status: Home / Self Care | Payer: No Typology Code available for payment source | Source: Ambulatory Visit | Attending: Family Medicine | Admitting: Family Medicine

## 2020-07-21 ENCOUNTER — Ambulatory Visit
Admission: RE | Admit: 2020-07-21 | Discharge: 2020-07-21 | Disposition: A | Discharge status: Home / Self Care | Payer: No Typology Code available for payment source | Attending: Family Medicine | Admitting: Family Medicine

## 2020-07-21 ENCOUNTER — Other Ambulatory Visit: Payer: Self-pay | Admitting: Family Medicine

## 2020-07-21 DIAGNOSIS — R0602 Shortness of breath: Secondary | ICD-10-CM

## 2020-09-05 ENCOUNTER — Other Ambulatory Visit: Payer: Self-pay

## 2020-09-08 ENCOUNTER — Other Ambulatory Visit: Payer: Self-pay

## 2020-09-08 ENCOUNTER — Encounter: Payer: Self-pay | Admitting: Gastroenterology

## 2020-09-08 ENCOUNTER — Ambulatory Visit (INDEPENDENT_AMBULATORY_CARE_PROVIDER_SITE_OTHER): Payer: No Typology Code available for payment source | Admitting: Gastroenterology

## 2020-09-08 VITALS — BP 126/79 | HR 65 | Temp 97.8°F | Ht 64.0 in | Wt 193.0 lb

## 2020-09-08 DIAGNOSIS — D519 Vitamin B12 deficiency anemia, unspecified: Secondary | ICD-10-CM | POA: Diagnosis not present

## 2020-09-08 DIAGNOSIS — Z9884 Bariatric surgery status: Secondary | ICD-10-CM | POA: Diagnosis not present

## 2020-09-08 DIAGNOSIS — D509 Iron deficiency anemia, unspecified: Secondary | ICD-10-CM | POA: Diagnosis not present

## 2020-09-08 DIAGNOSIS — Z6833 Body mass index (BMI) 33.0-33.9, adult: Secondary | ICD-10-CM | POA: Insufficient documentation

## 2020-09-08 MED ORDER — VITAMIN D (ERGOCALCIFEROL) 1.25 MG (50000 UNIT) PO CAPS: 50000.0000 [IU] | ORAL_CAPSULE | ORAL | 0 refills | Status: DC

## 2020-09-08 MED ORDER — CLENPIQ 10-3.5-12 MG-GM -GM/160ML PO SOLN: 320.0000 mL | Freq: Once | ORAL | 0 refills | Status: AC

## 2020-09-08 MED ORDER — CYANOCOBALAMIN 1000 MCG/ML IJ SOLN: INTRAMUSCULAR | 0 refills | Status: AC

## 2020-09-08 NOTE — Progress Notes (Signed)
Arlyss Repress, MD [address]  [address]  [place], [place] [address]  Main: [phone]  Fax: [phone]    Gastroenterology Consultation  Referring Provider:     Hillery Aldo, MD Primary Care Physician:  Hillery Aldo, MD Primary Gastroenterologist:  Dr. Arlyss Repress Reason for Consultation:     Iron deficiency anemia        HPI:   Erica Powers is a 59 y.o. female referred by Dr. Hillery Aldo, MD  for consultation & management of iron deficiency anemia.  Patient has history of Roux-en-Y gastric bypass in 2016, s/p small bowel obstruction, lysis of adhesions, history of H. pylori infection is seen in consultation for new diagnosis of iron deficiency anemia.  Patient reports ongoing fatigue for several months.,  Found to have hemoglobin of 10, MCV 81, ferritin 5.4, vitamin B12 153 in 03/2020.Patient denies melena, rectal bleeding.  She does report soreness in her upper stomach and abdominal bloating.  She denies any NSAID use.  She is taking multivitamin with minerals daily.  She was recommended to take oral iron 3 pills a day, resulted in severe constipation, currently taking 1 pill a day.  She is not on B12 supplements.  She takes over-the-counter vitamin D supplements.  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  She tries to exercise regularly She did have history of DVT, s/p IVC filter placement which has been removed  NSAIDs: None  Antiplts/Anticoagulants/Anti thrombotics: None  GI Procedures: EGD in 2016 by Dr. 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 - BENIGN COLONIC MUCOSA WITH LYMPHOID AGGREGATE (X1 FRAGMENT). - BENIGN COLONIC MUCOSA (X2 FRAGMENTS). - NO DYSPLASIA OR MALIGNANCY.   Past Medical History:  Diagnosis Date  . Arthritis   . Asthma   .  Difficult intubation    pt denies  . DVT, lower extremity (HCC)    right leg that went to lung  . GERD (gastroesophageal reflux disease)   . History of hiatal hernia   . Pulmonary embolism [facility])     Past Surgical History:  Procedure Laterality Date  . BREAST BIOPSY Bilateral    [facility]   . BREAST SURGERY  2000   Reduction  . CHOLECYSTECTOMY    . COLONOSCOPY N/A 04/08/2015   Procedure: COLONOSCOPY;  Surgeon: Midge Minium, MD;  Location: [facility];  Service: Gastroenterology;  Laterality: N/A;  . ESOPHAGOGASTRODUODENOSCOPY (EGD) WITH PROPOFOL N/A 07/07/2015   Procedure: ESOPHAGOGASTRODUODENOSCOPY (EGD) WITH PROPOFOL;  Surgeon: Midge Minium, MD;  Location: [facility];  Service: Endoscopy;  Laterality: N/A;  . GASTRIC ROUX-EN-Y N/A 11/01/2015   Procedure: LAPAROSCOPIC ROUX-EN-Y GASTRIC, HIATAL HERNIA REPAIR, EXTENSIVE LYSIS OF ADHESIONS;  Surgeon: Everette Rank, MD;  Location: [facility] ORS;  Service: General;  Laterality: N/A;  . HEEL SPUR SURGERY Bilateral   . IVC FILTER INSERTION N/A 01/19/2019   Procedure: IVC FILTER INSERTION;  Surgeon: Annice Needy, MD;  Location: [facility] INVAS[facility] CV LAB;  Service: Cardiovascular;  Laterality: N/A;  . IVC FILTER REMOVAL  04/16/2019  . IVC FILTER REMOVAL N/A 04/16/2019   Procedure: IVC FILTER REMOVAL;  Surgeon: Annice Needy, MD;  Location: [facility] INVAS[facility] CV LAB;  Service: Cardiovascular;  Laterality: N/A;  . REDUCTION MAMMAPLASTY Bilateral 2000  . ROTATOR CUFF REPAIR Right 2009  .  TONSILLECTOMY    . TOTAL KNEE ARTHROPLASTY Right 01/21/2019   Procedure: TOTAL KNEE ARTHROPLASTY with navigation;  Surgeon: Donato Heinz, MD;  Location: [facility] OR[facility];  Service: Orthopedics;  Laterality: Right;  . TUBAL LIGATION      Current Outpatient Medications:  .  acetaminophen (TYLENOL) 500 MG tablet, Take 500 mg by mouth every 6 (six) hours as needed., Disp: , Rfl:  .  albuterol (PROVENTIL HFA;VENTOLIN HFA) 108 (90 Base) MCG/ACT inhaler,  Inhale 1-2 puffs into the lungs every 4 (four) hours as needed for wheezing or shortness of breath. , Disp: , Rfl:  .  azelastine (ASTELIN) 0.1 % nasal spray, USE 1 TO 2 SPRAYS IN EACH NOSTRIL EVERY 12 HOURS AS NEEDED FOR ALLERGIES, Disp: , Rfl:  .  budesonide-formoterol (SYMBICORT) 80-4.5 MCG/ACT inhaler, Inhale into the lungs., Disp: , Rfl:  .  Calcium Carbonate-Vit D-Min (CALCIUM 600+D PLUS MINERALS) 600-400 MG-UNIT TABS, Take by mouth., Disp: , Rfl:  .  ferrous sulfate 325 (65 FE) MG EC tablet, Take by mouth., Disp: , Rfl:  .  meloxicam (MOBIC) 15 MG tablet, TAKE 1 TABLET BY MOUTH DAILY FOR 10 DAYS THEN AS NEEDED FOR HIP PAIN, Disp: , Rfl:  .  Multiple Vitamin (MULTI-VITAMIN) tablet, Take by mouth., Disp: , Rfl:  .  nystatin (MYCOSTATIN/NYSTOP) powder, Apply topically., Disp: , Rfl:  .  ondansetron (ZOFRAN-ODT) 4 MG disintegrating tablet, Take by mouth., Disp: , Rfl:  .  tiZANidine (ZANAFLEX) 4 MG tablet, Take 4 mg by mouth 3 (three) times daily., Disp: , Rfl:  .  traMADol (ULTRAM) 50 MG tablet, Take 1-2 tablets (50-100 mg total) by mouth every 4 (four) hours as needed for moderate pain., Disp: 30 tablet, Rfl: 1 .  cyanocobalamin (,VITAMIN B-12,) 1000 MCG/ML injection, Inject 9ml once a week for 4 weeks then once a month, Disp: 8 mL, Rfl: 0 .  Sod Picosulfate-Mag Ox-Cit Acd (CLENPIQ) 10-3.5-12 MG-GM -GM/160ML SOLN, Take 320 mLs by mouth once for 1 dose., Disp: 320 mL, Rfl: 0 .  Vitamin D, Ergocalciferol, (DRISDOL) 1.25 MG (50000 UNIT) CAPS capsule, Take 1 capsule (50,000 Units total) by mouth every 7 (seven) days., Disp: 12 capsule, Rfl: 0   Family History  Problem Relation Age of Onset  . Diabetes Mother   . Hypertension Mother   . Breast cancer Neg Hx      Social History   Tobacco Use  . Smoking status: Never Smoker  . Smokeless tobacco: Never Used  Vaping Use  . Vaping Use: Never used  Substance Use Topics  . Alcohol use: Not Currently  . Drug use: No    Allergies as of  09/08/2020 - Review Complete 09/08/2020  Allergen Reaction Noted  . Codeine Itching 06/24/2015  . Hydrocodone Itching 03/30/2015    Review of Systems:    All systems reviewed and negative except where noted in HPI.   Physical Exam:  BP 126/79 (BP Location: Left Arm, Patient Position: Sitting, Cuff Size: Normal)   Pulse 65   Temp 97.8 F (36.6 C) (Oral)   Ht 5\' 4"  (1.626 m)   Wt 193 lb (87.5 kg)   BMI 33.13 kg/m  No LMP recorded. Patient is postmenopausal.  General:   Alert,  Well-developed, well-nourished, pleasant and cooperative in NAD Head:  Normocephalic and atraumatic. Eyes:  Sclera clear, no icterus.   Conjunctiva pink. Ears:  Normal auditory acuity. Nose:  No deformity, discharge, or lesions. Mouth:  No deformity or lesions,oropharynx pink & moist. Neck:  Supple;  no masses or thyromegaly. Lungs:  Respirations even and unlabored.  Clear throughout to auscultation.   No wheezes, crackles, or rhonchi. No acute distress. Heart:  Regular rate and rhythm; no murmurs, clicks, rubs, or gallops. Abdomen:  Normal bowel sounds. Soft, mild epigastric tenderness and non-distended without masses, hepatosplenomegaly or hernias noted.  No guarding or rebound tenderness.   Rectal: Not performed Msk:  Symmetrical without gross deformities. Good, equal movement & strength bilaterally. Pulses:  Normal pulses noted. Extremities:  No clubbing or edema.  No cyanosis. Neurologic:  Alert and oriented x3;  grossly normal neurologically. Skin:  Intact without significant lesions or rashes. No jaundice. Lymph Nodes:  No significant cervical adenopathy. Psych:  Alert and cooperative. Normal mood and affect.  Imaging Studies: Reviewed  Assessment and Plan:   Erica Powers is a 59 y.o. pleasant African-American female with morbid obesity s/p Roux-en-Y gastric bypass in 2016, history of H. pylori infection, history of DVT s/p IVC filter placement and removal is seen in consultation for iron  deficiency anemia and chronic fatigue  Iron deficiency anemia with no evidence of active GI bleed Recommend EGD and colonoscopy for further evaluation Continue oral iron daily Recheck iron studies in 1 month  B12 deficiency Recommend B12 shots once a week for 1 month then once a month Recheck B12 levels in 1 month  Severe vitamin D deficiency Recommend vitamin D 50 K units once a week for 10 to 12 weeks Recheck vitamin D levels in 3 months  Follow up in 2 to 3 months   Arlyss Repress, MD

## 2020-09-22 ENCOUNTER — Other Ambulatory Visit: Payer: Self-pay

## 2020-09-22 ENCOUNTER — Encounter: Payer: Self-pay | Admitting: Gastroenterology

## 2020-09-27 ENCOUNTER — Other Ambulatory Visit: Payer: Self-pay

## 2020-09-27 ENCOUNTER — Other Ambulatory Visit
Admission: RE | Admit: 2020-09-27 | Discharge: 2020-09-27 | Disposition: A | Discharge status: Home / Self Care | Payer: No Typology Code available for payment source | Source: Ambulatory Visit | Attending: Gastroenterology | Admitting: Gastroenterology

## 2020-09-27 DIAGNOSIS — Z01812 Encounter for preprocedural laboratory examination: Secondary | ICD-10-CM | POA: Insufficient documentation

## 2020-09-27 DIAGNOSIS — Z20822 Contact with and (suspected) exposure to covid-19: Secondary | ICD-10-CM | POA: Diagnosis not present

## 2020-09-27 LAB — SARS CORONAVIRUS 2 (TAT 6-24 HRS): SARS Coronavirus 2: NEGATIVE

## 2020-09-28 NOTE — Discharge Instructions (Signed)
General Anesthesia, Adult, Care After This sheet gives you information about how to care for yourself after your procedure. Your health care provider may also give you more specific instructions. If you have problems or questions, contact your health care provider. What can I expect after the procedure? After the procedure, the following side effects are common:  Pain or discomfort at the IV site.  Nausea.  Vomiting.  Sore throat.  Trouble concentrating.  Feeling cold or chills.  Weak or tired.  Sleepiness and fatigue.  Soreness and body aches. These side effects can affect parts of the body that were not involved in surgery. Follow these instructions at home:  For at least 24 hours after the procedure:  Have a responsible adult stay with you. It is important to have someone help care for you until you are awake and alert.  Rest as needed.  Do not: ? Participate in activities in which you could fall or become injured. ? Drive. ? Use heavy machinery. ? Drink alcohol. ? Take sleeping pills or medicines that cause drowsiness. ? Make important decisions or sign legal documents. ? Take care of children on your own. Eating and drinking  Follow any instructions from your health care provider about eating or drinking restrictions.  When you feel hungry, start by eating small amounts of foods that are soft and easy to digest (bland), such as toast. Gradually return to your regular diet.  Drink enough fluid to keep your urine pale yellow.  If you vomit, rehydrate by drinking water, juice, or clear broth. General instructions  If you have sleep apnea, surgery and certain medicines can increase your risk for breathing problems. Follow instructions from your health care provider about wearing your sleep device: ? Anytime you are sleeping, including during daytime naps. ? While taking prescription pain medicines, sleeping medicines, or medicines that make you drowsy.  Return to  your normal activities as told by your health care provider. Ask your health care provider what activities are safe for you.  Take over-the-counter and prescription medicines only as told by your health care provider.  If you smoke, do not smoke without supervision.  Keep all follow-up visits as told by your health care provider. This is important. Contact a health care provider if:  You have nausea or vomiting that does not get better with medicine.  You cannot eat or drink without vomiting.  You have pain that does not get better with medicine.  You are unable to pass urine.  You develop a skin rash.  You have a fever.  You have redness around your IV site that gets worse. Get help right away if:  You have difficulty breathing.  You have chest pain.  You have blood in your urine or stool, or you vomit blood. Summary  After the procedure, it is common to have a sore throat or nausea. It is also common to feel tired.  Have a responsible adult stay with you for the first 24 hours after general anesthesia. It is important to have someone help care for you until you are awake and alert.  When you feel hungry, start by eating small amounts of foods that are soft and easy to digest (bland), such as toast. Gradually return to your regular diet.  Drink enough fluid to keep your urine pale yellow.  Return to your normal activities as told by your health care provider. Ask your health care provider what activities are safe for you. This information is not   intended to replace advice given to you by your health care provider. Make sure you discuss any questions you have with your health care provider. Document Revised: 11/22/2017 Document Reviewed: 07/05/2017 Elsevier Patient Education  2020 Elsevier Inc.  

## 2020-09-29 ENCOUNTER — Ambulatory Visit: Payer: No Typology Code available for payment source | Admitting: Anesthesiology

## 2020-09-29 ENCOUNTER — Other Ambulatory Visit: Payer: Self-pay

## 2020-09-29 ENCOUNTER — Encounter
Admission: RE | Disposition: A | Discharge status: Home / Self Care | Payer: Self-pay | Source: Home / Self Care | Attending: Gastroenterology

## 2020-09-29 ENCOUNTER — Encounter: Payer: Self-pay | Admitting: Gastroenterology

## 2020-09-29 ENCOUNTER — Ambulatory Visit
Admission: RE | Admit: 2020-09-29 | Discharge: 2020-09-29 | Disposition: A | Discharge status: Home / Self Care | Payer: No Typology Code available for payment source | Attending: Gastroenterology | Admitting: Gastroenterology

## 2020-09-29 DIAGNOSIS — D509 Iron deficiency anemia, unspecified: Secondary | ICD-10-CM | POA: Diagnosis not present

## 2020-09-29 DIAGNOSIS — Z885 Allergy status to narcotic agent status: Secondary | ICD-10-CM | POA: Diagnosis not present

## 2020-09-29 DIAGNOSIS — Z9884 Bariatric surgery status: Secondary | ICD-10-CM | POA: Diagnosis not present

## 2020-09-29 DIAGNOSIS — Z980 Intestinal bypass and anastomosis status: Secondary | ICD-10-CM | POA: Diagnosis not present

## 2020-09-29 HISTORY — PX: COLONOSCOPY WITH PROPOFOL: SHX5780

## 2020-09-29 HISTORY — PX: ESOPHAGOGASTRODUODENOSCOPY (EGD) WITH PROPOFOL: SHX5813

## 2020-09-29 HISTORY — DX: Other complications of anesthesia, initial encounter: T88.59XA

## 2020-09-29 SURGERY — COLONOSCOPY WITH PROPOFOL
Anesthesia: General | Site: Throat

## 2020-09-29 MED ORDER — GLYCOPYRROLATE 0.2 MG/ML IJ SOLN
INTRAMUSCULAR | Status: DC | PRN
  Administered 2020-09-29: .1 mg via INTRAVENOUS

## 2020-09-29 MED ORDER — LACTATED RINGERS IV SOLN: INTRAVENOUS | Status: DC

## 2020-09-29 MED ORDER — PROPOFOL 10 MG/ML IV BOLUS
INTRAVENOUS | Status: DC | PRN
  Administered 2020-09-29: 30 mg via INTRAVENOUS
  Administered 2020-09-29: 20 mg via INTRAVENOUS
  Administered 2020-09-29: 30 mg via INTRAVENOUS
  Administered 2020-09-29 (×2): 20 mg via INTRAVENOUS
  Administered 2020-09-29: 150 mg via INTRAVENOUS
  Administered 2020-09-29: 50 mg via INTRAVENOUS

## 2020-09-29 MED ORDER — SODIUM CHLORIDE 0.9 % IV SOLN: INTRAVENOUS | Status: DC

## 2020-09-29 MED ORDER — ACETAMINOPHEN 325 MG PO TABS: 325.0000 mg | ORAL_TABLET | Freq: Once | ORAL | Status: DC

## 2020-09-29 MED ORDER — ACETAMINOPHEN 160 MG/5ML PO SOLN: 325.0000 mg | Freq: Once | ORAL | Status: DC

## 2020-09-29 MED ORDER — LIDOCAINE HCL (CARDIAC) PF 100 MG/5ML IV SOSY
PREFILLED_SYRINGE | INTRAVENOUS | Status: DC | PRN
  Administered 2020-09-29: 50 mg via INTRAVENOUS

## 2020-09-29 SURGICAL SUPPLY — 7 items
BLOCK BITE 60FR ADLT L/F GRN (MISCELLANEOUS) ×4 IMPLANT
GOWN CVR UNV OPN BCK APRN NK (MISCELLANEOUS) ×4 IMPLANT
GOWN ISOL THUMB LOOP REG UNIV (MISCELLANEOUS) ×8
KIT PRC NS LF DISP ENDO (KITS) ×2 IMPLANT
KIT PROCEDURE OLYMPUS (KITS) ×4
MANIFOLD NEPTUNE II (INSTRUMENTS) ×4 IMPLANT
WATER STERILE IRR 250ML POUR (IV SOLUTION) ×4 IMPLANT

## 2020-09-29 NOTE — Transfer of Care (Signed)
Immediate Anesthesia Transfer of Care Note  Patient: Erica Powers  Procedure(s) Performed: COLONOSCOPY WITH PROPOFOL (N/A Rectum) ESOPHAGOGASTRODUODENOSCOPY (EGD) WITH PROPOFOL (N/A Throat)  Patient Location: PACU  Anesthesia Type: General  Level of Consciousness: awake, alert  and patient cooperative  Airway and Oxygen Therapy: Patient Spontanous Breathing and Patient connected to supplemental oxygen  Post-op Assessment: Post-op Vital signs reviewed, Patient's Cardiovascular Status Stable, Respiratory Function Stable, Patent Airway and No signs of Nausea or vomiting  Post-op Vital Signs: Reviewed and stable  Complications: No complications documented.

## 2020-09-29 NOTE — Anesthesia Procedure Notes (Signed)
Date/Time: 09/29/2020 11:17 AM Performed by: Maree Krabbe, CRNA Pre-anesthesia Checklist: Patient identified, Emergency Drugs available, Suction available, Timeout performed and Patient being monitored Patient Re-evaluated:Patient Re-evaluated prior to induction Oxygen Delivery Method: Nasal cannula Placement Confirmation: positive ETCO2

## 2020-09-29 NOTE — H&P (Signed)
Arlyss Repress, MD [address]  [address]  [place], [place] [address]  Main: [phone]  Fax: [phone] Pager: [phone]  Primary Care Physician:  Hillery Aldo, MD Primary Gastroenterologist:  Dr. Arlyss Repress  Pre-Procedure History & Physical: HPI:  Erica Powers is a 59 y.o. female is here for an endoscopy and colonoscopy.   Past Medical History:  Diagnosis Date   Arthritis    Asthma    Complication of anesthesia    Woke during IVC filter removal   Difficult intubation    pt denies   DVT, lower extremity (HCC) 01/15/2015   right leg that went to lung   GERD (gastroesophageal reflux disease)    History of hiatal hernia    Pulmonary embolism (HCC) 01/15/2015    Past Surgical History:  Procedure Laterality Date   BREAST BIOPSY Bilateral    [facility]    BREAST SURGERY  2000   Reduction   CHOLECYSTECTOMY     COLONOSCOPY N/A 04/08/2015   Procedure: COLONOSCOPY;  Surgeon: Midge Minium, MD;  Location: [facility];  Service: Gastroenterology;  Laterality: N/A;   ESOPHAGOGASTRODUODENOSCOPY (EGD) WITH PROPOFOL N/A 07/07/2015   Procedure: ESOPHAGOGASTRODUODENOSCOPY (EGD) WITH PROPOFOL;  Surgeon: Midge Minium, MD;  Location: [facility];  Service: Endoscopy;  Laterality: N/A;   GASTRIC ROUX-EN-Y N/A 11/01/2015   Procedure: LAPAROSCOPIC ROUX-EN-Y GASTRIC, HIATAL HERNIA REPAIR, EXTENSIVE LYSIS OF ADHESIONS;  Surgeon: Everette Rank, MD;  Location: [facility] OR[facility];  Service: General;  Laterality: N/A;   HEEL SPUR SURGERY Bilateral    IVC FILTER INSERTION N/A 01/19/2019   Procedure: IVC FILTER INSERTION;  Surgeon: Annice Needy, MD;  Location: [facility];  Service: Cardiovascular;  Laterality: N/A;   IVC FILTER REMOVAL  04/16/2019   IVC FILTER REMOVAL N/A 04/16/2019   Procedure: IVC FILTER REMOVAL;  Surgeon: Annice Needy, MD;  Location: [facility];  Service: Cardiovascular;  Laterality: N/A;   REDUCTION  MAMMAPLASTY Bilateral 2000   ROTATOR CUFF REPAIR Right 2009   TONSILLECTOMY     TOTAL KNEE ARTHROPLASTY Right 01/21/2019   Procedure: TOTAL KNEE ARTHROPLASTY with navigation;  Surgeon: Donato Heinz, MD;  Location: [facility];  Service: Orthopedics;  Laterality: Right;   TUBAL LIGATION      Prior to Admission medications   Medication Sig Start Date End Date Taking? Authorizing Provider  acetaminophen (TYLENOL) 500 MG tablet Take 500 mg by mouth every 6 (six) hours as needed.   Yes [provider]  albuterol (PROVENTIL HFA;VENTOLIN HFA) 108 (90 Base) MCG/ACT inhaler Inhale 1-2 puffs into the lungs every 4 (four) hours as needed for wheezing or shortness of breath.  10/18/15  Yes [provider]  azelastine (ASTELIN) 0.1 % nasal spray USE 1 TO 2 SPRAYS IN EACH NOSTRIL EVERY 12 HOURS AS NEEDED FOR ALLERGIES 03/18/20  Yes [provider]  budesonide-formoterol (SYMBICORT) 80-4.5 MCG/ACT inhaler Inhale into the lungs.   Yes [provider]  Calcium Carbonate-Vit D-Min (CALCIUM 600+D PLUS MINERALS) 600-400 MG-UNIT TABS Take by mouth.   Yes [provider]  cyanocobalamin (,VITAMIN B-12,) 1000 MCG/ML injection Inject 47ml once a week for 4 weeks then once a month 09/08/20  Yes Berthe Oley, Loel Dubonnet, MD  ferrous sulfate 325 (65 FE) MG EC tablet Take by mouth.   Yes [provider]  meloxicam (MOBIC) 15 MG tablet TAKE 1 TABLET BY MOUTH DAILY FOR 10 DAYS THEN AS NEEDED FOR HIP PAIN  08/26/19  Yes [provider]  Multiple Vitamin (MULTI-VITAMIN) tablet Take by mouth.   Yes [provider]  nystatin (MYCOSTATIN/NYSTOP) powder Apply topically. 02/10/20 02/09/21 Yes [provider]  Vitamin D, Ergocalciferol, (DRISDOL) 1.25 MG (50000 UNIT) CAPS capsule Take 1 capsule (50,000 Units total) by mouth every 7 (seven) days. 09/08/20  Yes Arden Axon, Loel Dubonnet, MD  ondansetron (ZOFRAN-ODT) 4 MG disintegrating tablet Take by mouth. Patient not  taking: Reported on 09/22/2020 10/09/19   [provider]  tiZANidine (ZANAFLEX) 4 MG tablet Take 4 mg by mouth 3 (three) times daily. Patient not taking: Reported on 09/22/2020 06/02/20   [provider]  traMADol (ULTRAM) 50 MG tablet Take 1-2 tablets (50-100 mg total) by mouth every 4 (four) hours as needed for moderate pain. Patient not taking: Reported on 09/22/2020 01/23/19   Dedra Skeens, PA-C    Allergies as of 09/08/2020 - Review Complete 09/08/2020  Allergen Reaction Noted   Codeine Itching 06/24/2015   Hydrocodone Itching 03/30/2015    Family History  Problem Relation Age of Onset   Diabetes Mother    Hypertension Mother    Breast cancer Neg Hx     Social History   Socioeconomic History   Marital status: Divorced    Spouse name: Not on file   Number of children: 2   Years of education: Not on file   Highest education level: Some college, no degree  Occupational History   Not on file  Tobacco Use   Smoking status: Never Smoker   Smokeless tobacco: Never Used  Building services engineer Use: Never used  Substance and Sexual Activity   Alcohol use: Not Currently   Drug use: No   Sexual activity: Not on file  Other Topics Concern   Not on file  Social History Narrative   Not on file   Social Determinants of Health   Financial Resource Strain:    Difficulty of Paying Living Expenses: Not on file  Food Insecurity:    Worried About Programme researcher, broadcasting/film/video in the Last Year: Not on file   [employer] of Food in the Last Year: Not on file  Transportation Needs:    Lack of Transportation (Medical): Not on file   Lack of Transportation (Non-Medical): Not on file  Physical Activity:    Days of Exercise per Week: Not on file   Minutes of Exercise per Session: Not on file  Stress:    Feeling of Stress : Not on file  Social Connections:    Frequency of Communication with Friends and Family: Not on file   Frequency of Social Gatherings  with Friends and Family: Not on file   Attends Religious Services: Not on file   Active Member of Clubs or Organizations: Not on file   Attends Banker Meetings: Not on file   Marital Status: Not on file  Intimate Partner Violence:    Fear of Current or Ex-Partner: Not on file   Emotionally Abused: Not on file   Physically Abused: Not on file   Sexually Abused: Not on file    Review of Systems: See HPI, otherwise negative ROS  Physical Exam: BP 112/76    Pulse (!) 58    Temp (!) 97 F (36.1 C) (Temporal)    Resp 16    Ht 5\' 4"  (1.626 m)    Wt 86.6 kg    SpO2 100%    BMI 32.79 kg/m  General:   Alert,  pleasant and cooperative in NAD Head:  Normocephalic and atraumatic. Neck:  Supple; no masses or thyromegaly. Lungs:  Clear throughout to auscultation.    Heart:  Regular rate and rhythm. Abdomen:  Soft, nontender and nondistended. Normal bowel sounds, without guarding, and without rebound.   Neurologic:  Alert and  oriented x4;  grossly normal neurologically.  Impression/Plan: Erica Powers is here for an endoscopy and colonoscopy to be performed for IDA  Risks, benefits, limitations, and alternatives regarding  endoscopy and colonoscopy have been reviewed with the patient.  Questions have been answered.  All parties agreeable.   Lannette Donath, MD  09/29/2020, 10:11 AM

## 2020-09-29 NOTE — Op Note (Addendum)
[facility] Gastroenterology Patient Name: Geniece Akers Procedure Date: 09/29/2020 11:10 AM MRN: [id number] Account #: [id number] Date of Birth: [birth year] Admit Type: Outpatient Age: 59 Room: [facility] OR ROOM 01 Gender: Female Note Status: Finalized Procedure:             Upper GI endoscopy Indications:           Unexplained iron deficiency anemia Providers:             Toney Reil MD, MD Referring MD:          Hillery Aldo MD, MD (Referring MD) Medicines:             General Anesthesia Complications:         No immediate complications. Estimated blood loss: None. Procedure:             Pre-Anesthesia Assessment:                        - Prior to the procedure, a History and Physical was                         performed, and patient medications and allergies were                         reviewed. The patient is competent. The risks and                         benefits of the procedure and the sedation options and                         risks were discussed with the patient. All questions                         were answered and informed consent was obtained.                         Patient identification and proposed procedure were                         verified by the physician, the nurse, the                         anesthesiologist, the anesthetist and the technician                         in the pre-procedure area in the procedure room in the                         endoscopy suite. Mental Status Examination: alert and                         oriented. Airway Examination: normal oropharyngeal                         airway and neck mobility. Respiratory Examination:                         clear to auscultation. CV Examination: normal.  Prophylactic Antibiotics: The patient does not require                         prophylactic antibiotics. Prior Anticoagulants: The                         patient has taken Plavix  (clopidogrel), last dose was                         5 days prior to procedure. ASA Grade Assessment: III -                         A patient with severe systemic disease. After                         reviewing the risks and benefits, the patient was                         deemed in satisfactory condition to undergo the                         procedure. The anesthesia plan was to use general                         anesthesia. Immediately prior to administration of                         medications, the patient was re-assessed for adequacy                         to receive sedatives. The heart rate, respiratory                         rate, oxygen saturations, blood pressure, adequacy of                         pulmonary ventilation, and response to care were                         monitored throughout the procedure. The physical                         status of the patient was re-assessed after the                         procedure.                        After obtaining informed consent, the endoscope was                         passed under direct vision. Throughout the procedure,                         the patient's blood pressure, pulse, and oxygen                         saturations were monitored continuously. The was  introduced through the mouth, and advanced to the                         afferent jejunal loop. The upper GI endoscopy was                         accomplished without difficulty. The patient tolerated                         the procedure well. Findings:      Evidence of a Roux-en-Y gastrojejunostomy was found. The gastrojejunal       anastomosis was characterized by healthy appearing mucosa. This was       traversed. The pouch-to-jejunum limb was characterized by healthy       appearing mucosa.      The gastroesophageal junction and examined esophagus were normal.      Esophagogastric landmarks were identified: the gastroesophageal  junction       was found at 39 cm from the incisors. Impression:            - Roux-en-Y gastrojejunostomy with gastrojejunal                         anastomosis characterized by healthy appearing mucosa.                        - Normal cardia and gastric body.                        - Normal gastroesophageal junction and esophagus.                        - No specimens collected. Recommendation:        - Proceed with colonoscopy as scheduled                        See colonoscopy report Procedure Code(s):     --- Professional ---                        [phone], Esophagogastroduodenoscopy, flexible,                         transoral; diagnostic, including collection of                         specimen(s) by brushing or washing, when performed                         (separate procedure) Diagnosis Code(s):     --- Professional ---                        Z98.0, Intestinal bypass and anastomosis status                        D50.9, Iron deficiency anemia, unspecified CPT copyright 2019 American Medical Association. All rights reserved. The codes documented in this report are preliminary and upon coder review may  be revised to meet current compliance requirements. Dr. Libby Maw Toney Reil MD, MD 09/29/2020 11:29:33 AM This report has been signed electronically. Number of Addenda: 0 Note  Initiated On: 09/29/2020 11:10 AM Estimated Blood Loss:  Estimated blood loss: none.      [facility]

## 2020-09-29 NOTE — Anesthesia Preprocedure Evaluation (Signed)
Anesthesia Evaluation  Patient identified by MRN, date of birth, ID band Patient awake    Reviewed: Allergy & Precautions, H&P , NPO status , Patient's Chart, lab work & pertinent test results  Airway Mallampati: II  TM Distance: >3 FB Neck ROM: full    Dental no notable dental hx.    Pulmonary asthma ,    Pulmonary exam normal breath sounds clear to auscultation       Cardiovascular hypertension, Normal cardiovascular exam Rhythm:regular Rate:Normal     Neuro/Psych    GI/Hepatic GERD  ,  Endo/Other    Renal/GU      Musculoskeletal   Abdominal   Peds  Hematology   Anesthesia Other Findings   Reproductive/Obstetrics                             Anesthesia Physical Anesthesia Plan  ASA: II  Anesthesia Plan: General   Post-op Pain Management:    Induction: Intravenous  PONV Risk Score and Plan: 3 and Treatment may vary due to age or medical condition, Propofol infusion and TIVA  Airway Management Planned: Natural Airway  Additional Equipment:   Intra-op Plan:   Post-operative Plan:   Informed Consent: I have reviewed the patients History and Physical, chart, labs and discussed the procedure including the risks, benefits and alternatives for the proposed anesthesia with the patient or authorized representative who has indicated his/her understanding and acceptance.     Dental Advisory Given  Plan Discussed with: CRNA  Anesthesia Plan Comments:         Anesthesia Quick Evaluation

## 2020-09-29 NOTE — Anesthesia Postprocedure Evaluation (Signed)
Anesthesia Post Note  Patient: Erica Powers  Procedure(s) Performed: COLONOSCOPY WITH PROPOFOL (N/A Rectum) ESOPHAGOGASTRODUODENOSCOPY (EGD) WITH PROPOFOL (N/A Throat)     Patient location during evaluation: PACU Anesthesia Type: General Level of consciousness: awake and alert and oriented Pain management: satisfactory to patient Vital Signs Assessment: post-procedure vital signs reviewed and stable Respiratory status: spontaneous breathing, nonlabored ventilation and respiratory function stable Cardiovascular status: blood pressure returned to baseline and stable Postop Assessment: Adequate PO intake and No signs of nausea or vomiting Anesthetic complications: no   No complications documented.  Cherly [place]

## 2020-09-29 NOTE — Op Note (Addendum)
[facility] Gastroenterology Patient Name: Erica Powers Procedure Date: 09/29/2020 11:09 AM MRN: [id number] Account #: [id number] Date of Birth: [birth year] Admit Type: Outpatient Age: 59 Room: [facility] OR ROOM 01 Gender: Female Note Status: Finalized Procedure:             Colonoscopy Indications:           Last colonoscopy: May 2016, Unexplained iron                         deficiency anemia Providers:             Toney Reil MD, MD Referring MD:          Hillery Aldo MD, MD (Referring MD) Medicines:             General Anesthesia Complications:         No immediate complications. Estimated blood loss: None. Procedure:             Pre-Anesthesia Assessment:                        - Prior to the procedure, a History and Physical was                         performed, and patient medications and allergies were                         reviewed. The patient is competent. The risks and                         benefits of the procedure and the sedation options and                         risks were discussed with the patient. All questions                         were answered and informed consent was obtained.                         Patient identification and proposed procedure were                         verified by the physician, the nurse, the                         anesthesiologist, the anesthetist and the technician                         in the pre-procedure area in the procedure room in the                         endoscopy suite. Mental Status Examination: alert and                         oriented. Airway Examination: normal oropharyngeal                         airway and neck mobility. Respiratory Examination:  clear to auscultation. CV Examination: normal.                         Prophylactic Antibiotics: The patient does not require                         prophylactic antibiotics. Prior Anticoagulants: The                          patient has taken Plavix (clopidogrel), last dose was                         5 days prior to procedure. ASA Grade Assessment: III -                         A patient with severe systemic disease. After                         reviewing the risks and benefits, the patient was                         deemed in satisfactory condition to undergo the                         procedure. The anesthesia plan was to use general                         anesthesia. Immediately prior to administration of                         medications, the patient was re-assessed for adequacy                         to receive sedatives. The heart rate, respiratory                         rate, oxygen saturations, blood pressure, adequacy of                         pulmonary ventilation, and response to care were                         monitored throughout the procedure. The physical                         status of the patient was re-assessed after the                         procedure.                        After obtaining informed consent, the colonoscope was                         passed under direct vision. Throughout the procedure,                         the patient's blood pressure, pulse, and oxygen  saturations were monitored continuously. The was                         introduced through the anus and advanced to the the                         terminal ileum, with identification of the appendiceal                         orifice and IC valve. The colonoscopy was performed                         without difficulty. The patient tolerated the                         procedure well. The quality of the bowel preparation                         was evaluated using the BBPS [facility] Bowel Preparation                         Scale) with scores of: Right Colon = 3, Transverse                         Colon = 3 and Left Colon = 3 (entire mucosa seen well                          with no residual staining, small fragments of stool or                         opaque liquid). The total BBPS score equals 9. Findings:      The perianal and digital rectal examinations were normal. Pertinent       negatives include normal sphincter tone and no palpable rectal lesions.      The terminal ileum appeared normal.      The entire examined colon appeared normal.      The retroflexed view of the distal rectum and anal verge was normal and       showed no anal or rectal abnormalities. Impression:            - The examined portion of the ileum was normal.                        - The entire examined colon is normal.                        - The distal rectum and anal verge are normal on                         retroflexion view.                        - No specimens collected. Recommendation:        - Discharge patient to home (with escort).                        - Resume previous diet today.                        -  Continue present medications.                        - Repeat colonoscopy in 10 years for screening                         purposes.                        - Return to my office as previously scheduled.                        - Resume Plavix (clopidogrel) at prior dose in 3 days.                         Refer to managing physician for further adjustment of                         therapy. Procedure Code(s):     --- Professional ---                        [phone], Colonoscopy, flexible; diagnostic, including                         collection of specimen(s) by brushing or washing, when                         performed (separate procedure) Diagnosis Code(s):     --- Professional ---                        D50.9, Iron deficiency anemia, unspecified CPT copyright 2019 American Medical Association. All rights reserved. The codes documented in this report are preliminary and upon coder review may  be revised to meet current compliance requirements. Dr. Libby Maw Toney Reil MD, MD 09/29/2020 11:45:36 AM This report has been signed electronically. Number of Addenda: 0 Note Initiated On: 09/29/2020 11:09 AM Scope Withdrawal Time: 0 hours 8 minutes 18 seconds  Total Procedure Duration: 0 hours 12 minutes 3 seconds  Estimated Blood Loss:  Estimated blood loss: none.      [facility]

## 2020-09-30 ENCOUNTER — Encounter: Payer: Self-pay | Admitting: Gastroenterology

## 2020-12-06 ENCOUNTER — Ambulatory Visit: Payer: No Typology Code available for payment source | Admitting: Gastroenterology

## 2020-12-06 ENCOUNTER — Encounter: Payer: Self-pay | Admitting: Gastroenterology

## 2020-12-06 VITALS — BP 145/81 | HR 62 | Temp 98.0°F | Ht 64.0 in | Wt 196.0 lb

## 2020-12-06 DIAGNOSIS — Z9884 Bariatric surgery status: Secondary | ICD-10-CM

## 2020-12-06 DIAGNOSIS — D519 Vitamin B12 deficiency anemia, unspecified: Secondary | ICD-10-CM

## 2020-12-06 DIAGNOSIS — D509 Iron deficiency anemia, unspecified: Secondary | ICD-10-CM | POA: Diagnosis not present

## 2020-12-06 NOTE — Progress Notes (Signed)
Arlyss Repress, MD [address]  [address]  [place], [place] [address]  Main: [phone]  Fax: [phone]    Gastroenterology Consultation  Referring Provider:     Hillery Aldo, MD Primary Care Physician:  Hillery Aldo, MD Primary Gastroenterologist:  Dr. Arlyss Repress Reason for Consultation:     Iron deficiency anemia        HPI:   Erica Powers is a 60 y.o. female referred by Dr. Hillery Aldo, MD  for consultation & management of iron deficiency anemia.  Patient has history of Roux-en-Y gastric bypass in 2016, s/p small bowel obstruction, lysis of adhesions, history of H. pylori infection is seen in consultation for new diagnosis of iron deficiency anemia.  Patient reports ongoing fatigue for several months.,  Found to have hemoglobin of 10, MCV 81, ferritin 5.4, vitamin B12 153 in 03/2020.Patient denies melena, rectal bleeding.  She does report soreness in her upper stomach and abdominal bloating.  She denies any NSAID use.  She is taking multivitamin with minerals daily.  She was recommended to take oral iron 3 pills a day, resulted in severe constipation, currently taking 1 pill a day.  She is not on B12 supplements.  She takes over-the-counter vitamin D supplements.  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  She tries to exercise regularly She did have history of DVT, s/p IVC filter placement which has been removed  Follow-up visit 12/06/2020 Patient denies any GI symptoms today.  She continues to take iron supplements as well as B12 injections along with vitamin D.  She reports that her energy levels have significantly improved.  She underwent upper endoscopy as well as colonoscopy with no source of anemia identified except for Roux-en-Y gastric bypass.  NSAIDs: None  Antiplts/Anticoagulants/Anti thrombotics: None  GI Procedures:  EGD and colonoscopy 09/29/2020 - Roux-en-Y  gastrojejunostomy with gastrojejunal anastomosis characterized by healthy appearing mucosa. - Normal cardia and gastric body. - Normal gastroesophageal junction and esophagus. - No specimens collected.  - The examined portion of the ileum was normal. - The entire examined colon is normal. - The distal rectum and anal verge are normal on retroflexion view. - No specimens collected.  EGD in 2016 by Dr. 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 - BENIGN COLONIC MUCOSA WITH LYMPHOID AGGREGATE (X1 FRAGMENT). - BENIGN COLONIC MUCOSA (X2 FRAGMENTS). - NO DYSPLASIA OR MALIGNANCY.   Past Medical History:  Diagnosis Date  . Arthritis   . Asthma   . Complication of anesthesia    Woke during IVC filter removal  . Difficult intubation    pt denies  . DVT, lower extremity (HCC) 01/15/2015   right leg that went to lung  . GERD (gastroesophageal reflux disease)   . History of hiatal hernia   . Pulmonary embolism (HCC) 01/15/2015    Past Surgical History:  Procedure Laterality Date  . BREAST BIOPSY Bilateral    bilat bx done neg at [facility]   . BREAST SURGERY  2000   Reduction  . CHOLECYSTECTOMY    . COLONOSCOPY N/A 04/08/2015   Procedure: COLONOSCOPY;  Surgeon: Midge Minium, MD;  Location: [facility];  Service: Gastroenterology;  Laterality: N/A;  . COLONOSCOPY WITH PROPOFOL N/A 09/29/2020   Procedure: COLONOSCOPY WITH PROPOFOL;  Surgeon: Toney Reil, MD;  Location: [facility];  Service: Endoscopy;  Laterality: N/A;  . ESOPHAGOGASTRODUODENOSCOPY (EGD) WITH PROPOFOL N/A 07/07/2015   Procedure: ESOPHAGOGASTRODUODENOSCOPY (EGD) WITH PROPOFOL;  Surgeon: Midge Minium, MD;  Location: [facility];  Service: Endoscopy;  Laterality: N/A;  . ESOPHAGOGASTRODUODENOSCOPY (EGD) WITH PROPOFOL N/A 09/29/2020   Procedure: ESOPHAGOGASTRODUODENOSCOPY  (EGD) WITH PROPOFOL;  Surgeon: Toney Reil, MD;  Location: [facility];  Service: Endoscopy;  Laterality: N/A;  . GASTRIC ROUX-EN-Y N/A 11/01/2015   Procedure: LAPAROSCOPIC ROUX-EN-Y GASTRIC, HIATAL HERNIA REPAIR, EXTENSIVE LYSIS OF ADHESIONS;  Surgeon: Everette Rank, MD;  Location: [facility];  Service: General;  Laterality: N/A;  . HEEL SPUR SURGERY Bilateral   . IVC FILTER INSERTION N/A 01/19/2019   Procedure: IVC FILTER INSERTION;  Surgeon: Annice Needy, MD;  Location: [facility];  Service: Cardiovascular;  Laterality: N/A;  . IVC FILTER REMOVAL  04/16/2019  . IVC FILTER REMOVAL N/A 04/16/2019   Procedure: IVC FILTER REMOVAL;  Surgeon: Annice Needy, MD;  Location: [facility];  Service: Cardiovascular;  Laterality: N/A;  . REDUCTION MAMMAPLASTY Bilateral 2000  . ROTATOR CUFF REPAIR Right 2009  . TONSILLECTOMY    . TOTAL KNEE ARTHROPLASTY Right 01/21/2019   Procedure: TOTAL KNEE ARTHROPLASTY with navigation;  Surgeon: Donato Heinz, MD;  Location: [facility];  Service: Orthopedics;  Laterality: Right;  . TUBAL LIGATION      Current Outpatient Medications:  .  acetaminophen (TYLENOL) 500 MG tablet, Take 500 mg by mouth every 6 (six) hours as needed., Disp: , Rfl:  .  albuterol (PROVENTIL HFA;VENTOLIN HFA) 108 (90 Base) MCG/ACT inhaler, Inhale 1-2 puffs into the lungs every 4 (four) hours as needed for wheezing or shortness of breath. , Disp: , Rfl:  .  azelastine (ASTELIN) 0.1 % nasal spray, USE 1 TO 2 SPRAYS IN EACH NOSTRIL EVERY 12 HOURS AS NEEDED FOR ALLERGIES, Disp: , Rfl:  .  budesonide-formoterol (SYMBICORT) 80-4.5 MCG/ACT inhaler, Inhale into the lungs., Disp: , Rfl:  .  Calcium Carbonate-Vit D-Min (CALCIUM 600+D PLUS MINERALS) 600-400 MG-UNIT TABS, Take by mouth., Disp: , Rfl:  .  cyanocobalamin (,VITAMIN B-12,) 1000 MCG/ML injection, Inject 11ml once a week for 4 weeks then once a month, Disp: 8 mL, Rfl: 0 .  ferrous sulfate 325 (65 FE) MG EC tablet,  Take by mouth., Disp: , Rfl:  .  meloxicam (MOBIC) 15 MG tablet, TAKE 1 TABLET BY MOUTH DAILY FOR 10 DAYS THEN AS NEEDED FOR HIP PAIN, Disp: , Rfl:  .  Multiple Vitamin (MULTI-VITAMIN) tablet, Take by mouth., Disp: , Rfl:  .  nystatin (MYCOSTATIN/NYSTOP) powder, Apply topically., Disp: , Rfl:  .  Vitamin D, Ergocalciferol, (DRISDOL) 1.25 MG (50000 UNIT) CAPS capsule, Take 1 capsule (50,000 Units total) by mouth every 7 (seven) days., Disp: 12 capsule, Rfl: 0   Family History  Problem Relation Age of Onset  . Diabetes Mother   . Hypertension Mother   . Breast cancer Neg Hx      Social History   Tobacco Use  . Smoking status: Never Smoker  . Smokeless tobacco: Never Used  Vaping Use  . Vaping Use: Never used  Substance Use Topics  . Alcohol use: Not Currently  . Drug use: No    Allergies as of 12/06/2020 - Review Complete 12/06/2020  Allergen Reaction Noted  . Codeine Itching 06/24/2015  . Hydrocodone Itching 03/30/2015    Review of Systems:    All systems reviewed and negative except where  noted in HPI.   Physical Exam:  BP (!) 145/81 (BP Location: Left Arm, Patient Position: Sitting, Cuff Size: Normal)   Pulse 62   Temp 98 F (36.7 C) (Oral)   Ht 5\' 4"  (1.626 m)   Wt 196 lb (88.9 kg)   BMI 33.64 kg/m  No LMP recorded. Patient is postmenopausal.  General:   Alert,  Well-developed, well-nourished, pleasant and cooperative in NAD Head:  Normocephalic and atraumatic. Eyes:  Sclera clear, no icterus.   Conjunctiva pink. Ears:  Normal auditory acuity. Nose:  No deformity, discharge, or lesions. Mouth:  No deformity or lesions,oropharynx pink & moist. Neck:  Supple; no masses or thyromegaly. Lungs:  Respirations even and unlabored.  Clear throughout to auscultation.   No wheezes, crackles, or rhonchi. No acute distress. Heart:  Regular rate and rhythm; no murmurs, clicks, rubs, or gallops. Abdomen:  Normal bowel sounds. Soft, nontender and non-distended without masses,  hepatosplenomegaly or hernias noted.  No guarding or rebound tenderness.   Rectal: Not performed Msk:  Symmetrical without gross deformities. Good, equal movement & strength bilaterally. Pulses:  Normal pulses noted. Extremities:  No clubbing or edema.  No cyanosis. Neurologic:  Alert and oriented x3;  grossly normal neurologically. Skin:  Intact without significant lesions or rashes. No jaundice. Psych:  Alert and cooperative. Normal mood and affect.  Imaging Studies: Reviewed  Assessment and Plan:   Erica Powers is a 60 y.o. pleasant African-American female with morbid obesity s/p Roux-en-Y gastric bypass in 2016, history of H. pylori infection, history of DVT s/p IVC filter placement and removal is seen in consultation for iron deficiency anemia and chronic fatigue, secondary to Roux-en-Y gastric bypass  Iron deficiency anemia with no evidence of active GI bleed EGD and colonoscopy are unremarkable Continue oral iron daily Recheck CBC and iron studies today  B12 deficiency Continue B12 shots once a month Recheck B12 levels today  Severe vitamin D deficiency Continue vitamin D 50 K units once a week  Recheck vitamin D levels today  History of Roux-en-Y gastric bypass Recommend bariatric multivitamin with minerals Check serum copper and zinc levels  Follow up in every 6 months   Cephas Darby, MD

## 2020-12-08 ENCOUNTER — Telehealth: Payer: Self-pay

## 2020-12-08 ENCOUNTER — Other Ambulatory Visit: Payer: Self-pay | Admitting: Gastroenterology

## 2020-12-08 DIAGNOSIS — D519 Vitamin B12 deficiency anemia, unspecified: Secondary | ICD-10-CM

## 2020-12-08 DIAGNOSIS — D509 Iron deficiency anemia, unspecified: Secondary | ICD-10-CM

## 2020-12-08 LAB — CBC
Hematocrit: 34.6 % (ref 34.0–46.6)
Hemoglobin: 11.5 g/dL (ref 11.1–15.9)
MCH: 29.8 pg (ref 26.6–33.0)
MCHC: 33.2 g/dL (ref 31.5–35.7)
MCV: 90 fL (ref 79–97)
Platelets: 261 10*3/uL (ref 150–450)
RBC: 3.86 x10E6/uL (ref 3.77–5.28)
RDW: 14 % (ref 11.7–15.4)
WBC: 7 10*3/uL (ref 3.4–10.8)

## 2020-12-08 LAB — B12 AND FOLATE PANEL
Folate: 14.7 ng/mL (ref >3.0)
Vitamin B-12: 1415 pg/mL — ABNORMAL HIGH (ref 232–1245)

## 2020-12-08 LAB — IRON,TIBC AND FERRITIN PANEL
Ferritin: 13 ng/mL — ABNORMAL LOW (ref 15–150)
Iron Saturation: 9 % — CL (ref 15–55)
Iron: 35 ug/dL (ref 27–159)
Total Iron Binding Capacity: 410 ug/dL (ref 250–450)
UIBC: 375 ug/dL (ref 131–425)

## 2020-12-08 LAB — COPPER, SERUM: Copper: 115 ug/dL (ref 80–158)

## 2020-12-08 LAB — VITAMIN D 25 HYDROXY (VIT D DEFICIENCY, FRACTURES): Vit D, 25-Hydroxy: 28.3 ng/mL — ABNORMAL LOW (ref 30.0–100.0)

## 2020-12-08 LAB — ZINC: Zinc: 65 ug/dL (ref 44–115)

## 2020-12-08 NOTE — Telephone Encounter (Signed)
Last office visit 12/06/2020  Last refill 09/08/2020 0 refills  Checked vitamin d levels on 12/06/2020

## 2020-12-08 NOTE — Telephone Encounter (Signed)
Called and left a message for call back. Order lab work for 3 months

## 2020-12-08 NOTE — Telephone Encounter (Signed)
-----   Message from Toney Reil, MD sent at 12/08/2020  9:14 AM EST ----- Anemia has resolved.  She should continue taking oral iron 1-2 times daily as her iron levels are still low.  B12 levels are above normal.  Therefore, she can hold off on B12 injections for next 3 months.  Vitamin D deficiency, vitamin D prescribed  Recheck iron panel and B12 levels in 3 months  Erica Powers

## 2020-12-09 NOTE — Telephone Encounter (Signed)
Called and left a message for call back  

## 2020-12-12 NOTE — Telephone Encounter (Signed)
Patient verbalized understanding of results  

## 2021-01-25 ENCOUNTER — Ambulatory Visit
Admission: RE | Admit: 2021-01-25 | Discharge: 2021-01-25 | Disposition: A | Discharge status: Home / Self Care | Payer: No Typology Code available for payment source | Source: Ambulatory Visit | Attending: Family Medicine | Admitting: Family Medicine

## 2021-01-25 ENCOUNTER — Other Ambulatory Visit: Payer: Self-pay

## 2021-01-25 DIAGNOSIS — Z1231 Encounter for screening mammogram for malignant neoplasm of breast: Secondary | ICD-10-CM | POA: Diagnosis present

## 2021-02-03 ENCOUNTER — Other Ambulatory Visit: Payer: Self-pay | Admitting: Gastroenterology

## 2021-03-22 DIAGNOSIS — R110 Nausea: Secondary | ICD-10-CM | POA: Insufficient documentation

## 2021-03-22 DIAGNOSIS — R140 Abdominal distension (gaseous): Secondary | ICD-10-CM | POA: Insufficient documentation

## 2021-03-24 DIAGNOSIS — K5900 Constipation, unspecified: Secondary | ICD-10-CM | POA: Insufficient documentation

## 2021-04-04 ENCOUNTER — Ambulatory Visit: Payer: No Typology Code available for payment source | Admitting: Gastroenterology

## 2021-07-17 ENCOUNTER — Other Ambulatory Visit: Payer: Self-pay | Admitting: Neurosurgery

## 2021-07-17 DIAGNOSIS — M4722 Other spondylosis with radiculopathy, cervical region: Secondary | ICD-10-CM

## 2021-08-24 ENCOUNTER — Ambulatory Visit: Payer: No Typology Code available for payment source

## 2021-08-29 ENCOUNTER — Other Ambulatory Visit: Payer: Self-pay

## 2021-08-29 ENCOUNTER — Ambulatory Visit
Admission: RE | Admit: 2021-08-29 | Discharge: 2021-08-29 | Disposition: A | Discharge status: Home / Self Care | Payer: No Typology Code available for payment source | Source: Ambulatory Visit | Attending: Neurosurgery | Admitting: Neurosurgery

## 2021-08-29 DIAGNOSIS — M4722 Other spondylosis with radiculopathy, cervical region: Secondary | ICD-10-CM

## 2021-10-06 ENCOUNTER — Ambulatory Visit
Admission: RE | Admit: 2021-10-06 | Discharge: 2021-10-06 | Disposition: A | Discharge status: Home / Self Care | Payer: No Typology Code available for payment source | Source: Ambulatory Visit | Attending: Family Medicine | Admitting: Family Medicine

## 2021-10-06 ENCOUNTER — Ambulatory Visit
Admission: RE | Admit: 2021-10-06 | Discharge: 2021-10-06 | Disposition: A | Discharge status: Home / Self Care | Payer: No Typology Code available for payment source | Attending: Family Medicine | Admitting: Family Medicine

## 2021-10-06 ENCOUNTER — Other Ambulatory Visit: Payer: Self-pay | Admitting: Family Medicine

## 2021-10-06 DIAGNOSIS — R0602 Shortness of breath: Secondary | ICD-10-CM | POA: Insufficient documentation

## 2021-12-22 ENCOUNTER — Other Ambulatory Visit: Payer: Self-pay

## 2021-12-22 ENCOUNTER — Ambulatory Visit: Payer: No Typology Code available for payment source | Admitting: Podiatry

## 2021-12-22 ENCOUNTER — Encounter: Payer: Self-pay | Admitting: Podiatry

## 2021-12-22 VITALS — BP 134/78 | HR 62 | Temp 98.1°F | Resp 20

## 2021-12-22 DIAGNOSIS — B070 Plantar wart: Secondary | ICD-10-CM | POA: Diagnosis not present

## 2021-12-22 NOTE — Progress Notes (Signed)
° °  Subjective: 62 y.o. female presenting today as a new patient for evaluation of symptomatic skin lesions to the plantar aspect of the right foot that is been going on for about 1 month.  Gradual onset.  She says they are very tender to palpation.  Aggravated by walking.  She has tried to shave them down and she has been applying medicated pads with no improvement.  She presents for further treatment and evaluation   Past Medical History:  Diagnosis Date   Arthritis    Asthma    Complication of anesthesia    Woke during IVC filter removal   Difficult intubation    pt denies   DVT, lower extremity ([place]) 01/15/2015   right leg that went to lung   GERD (gastroesophageal reflux disease)    History of hiatal hernia    Pulmonary embolism ([place]) 01/15/2015    Objective: Physical Exam General: The patient is alert and oriented x3 in no acute distress.   Dermatology: Hyperkeratotic skin lesion(s) noted to the plantar aspect of the right foot approximately 1 cm in diameter. Pinpoint bleeding noted upon debridement. Skin is warm, dry and supple bilateral lower extremities. Negative for open lesions or macerations.   Vascular: Palpable pedal pulses bilaterally. No edema or erythema noted. Capillary refill within normal limits.   Neurological: Epicritic and protective threshold grossly intact bilaterally.    Musculoskeletal Exam: Pain on palpation to the noted skin lesion(s).  Range of motion within normal limits to all pedal and ankle joints bilateral. Muscle strength 5/5 in all groups bilateral.    Assessment: #1 plantar wart right foot #2 pain in right foot     Plan of Care:  #1 Patient was evaluated. #2 Excisional debridement of the plantar wart lesion(s) was performed using a chisel blade.  Salicylic acid was applied and the lesion(s) was dressed with a dry sterile dressing. #3 Glycylic salicylic acid OTC was provided for the patient to apply daily  #4 patient is to return to clinic  in 2 weeks.     Edrick Kins, DPM [facility]  Dr. Edrick Kins, DPM    [address], [place] [address]                Office [phone]  Fax [phone]

## 2022-01-04 DIAGNOSIS — R1319 Other dysphagia: Secondary | ICD-10-CM | POA: Insufficient documentation

## 2022-01-04 DIAGNOSIS — R1110 Vomiting, unspecified: Secondary | ICD-10-CM | POA: Insufficient documentation

## 2022-01-04 DIAGNOSIS — E611 Iron deficiency: Secondary | ICD-10-CM | POA: Insufficient documentation

## 2022-01-23 ENCOUNTER — Ambulatory Visit: Payer: No Typology Code available for payment source | Admitting: Podiatry

## 2022-04-30 ENCOUNTER — Emergency Department
Admission: EM | Admit: 2022-04-30 | Discharge: 2022-04-30 | Disposition: A | Discharge status: Home / Self Care | Payer: BC Managed Care – PPO | Attending: Emergency Medicine | Admitting: Emergency Medicine

## 2022-04-30 ENCOUNTER — Other Ambulatory Visit: Payer: Self-pay

## 2022-04-30 ENCOUNTER — Emergency Department: Payer: BC Managed Care – PPO

## 2022-04-30 DIAGNOSIS — S01111A Laceration without foreign body of right eyelid and periocular area, initial encounter: Secondary | ICD-10-CM | POA: Insufficient documentation

## 2022-04-30 DIAGNOSIS — I10 Essential (primary) hypertension: Secondary | ICD-10-CM | POA: Diagnosis not present

## 2022-04-30 DIAGNOSIS — E119 Type 2 diabetes mellitus without complications: Secondary | ICD-10-CM | POA: Insufficient documentation

## 2022-04-30 DIAGNOSIS — S0993XA Unspecified injury of face, initial encounter: Secondary | ICD-10-CM | POA: Diagnosis present

## 2022-04-30 DIAGNOSIS — M25462 Effusion, left knee: Secondary | ICD-10-CM | POA: Diagnosis not present

## 2022-04-30 DIAGNOSIS — M25469 Effusion, unspecified knee: Secondary | ICD-10-CM

## 2022-04-30 DIAGNOSIS — W01198A Fall on same level from slipping, tripping and stumbling with subsequent striking against other object, initial encounter: Secondary | ICD-10-CM | POA: Diagnosis not present

## 2022-04-30 DIAGNOSIS — Z96652 Presence of left artificial knee joint: Secondary | ICD-10-CM | POA: Insufficient documentation

## 2022-04-30 DIAGNOSIS — S0990XA Unspecified injury of head, initial encounter: Secondary | ICD-10-CM | POA: Insufficient documentation

## 2022-04-30 DIAGNOSIS — Y9301 Activity, walking, marching and hiking: Secondary | ICD-10-CM | POA: Insufficient documentation

## 2022-04-30 DIAGNOSIS — S0181XA Laceration without foreign body of other part of head, initial encounter: Secondary | ICD-10-CM

## 2022-04-30 MED ORDER — CYCLOBENZAPRINE HCL 5 MG PO TABS: 5.0000 mg | ORAL_TABLET | Freq: Three times a day (TID) | ORAL | 0 refills | Status: AC | PRN

## 2022-04-30 MED ORDER — LIDOCAINE HCL (PF) 1 % IJ SOLN
5.0000 mL | Freq: Once | INTRAMUSCULAR | Status: DC
Start: 2022-04-30 — End: 2022-05-01
  Filled 2022-04-30: qty 5

## 2022-04-30 MED ORDER — OXYCODONE-ACETAMINOPHEN 5-325 MG PO TABS
1.0000 | ORAL_TABLET | Freq: Once | ORAL | Status: AC
  Administered 2022-04-30: 1 via ORAL
  Filled 2022-04-30: qty 1

## 2022-04-30 MED ORDER — DICLOFENAC SODIUM 75 MG PO TBEC
75.0000 mg | DELAYED_RELEASE_TABLET | Freq: Two times a day (BID) | ORAL | 0 refills | Status: AC
Start: 2022-04-30 — End: 2022-05-15

## 2022-04-30 MED ORDER — ONDANSETRON 4 MG PO TBDP
4.0000 mg | ORAL_TABLET | Freq: Once | ORAL | Status: AC
Start: 2022-04-30 — End: 2022-04-30
  Administered 2022-04-30: 4 mg via ORAL
  Filled 2022-04-30: qty 1

## 2022-04-30 MED ORDER — ONDANSETRON 4 MG PO TBDP
ORAL_TABLET | ORAL | Status: AC
  Filled 2022-04-30: qty 1

## 2022-04-30 NOTE — ED Notes (Signed)
dc ppw provided to patient. qusetions, followup and rx info addressed. PT provides verbal consent for dc at this time. pt via wheelchair to lobby alert and oriented x4.

## 2022-04-30 NOTE — ED Provider Notes (Signed)
[facility] Emergency Department Provider Note     Event Date/Time   First MD Initiated Contact with Patient 04/30/22 2038     (approximate)   History   Fall   HPI  Erica Powers is a 61 y.o. female with a diabetes, arthritis, hypertension, DDD, left TKA, presents to the ED for evaluation of injuries following mechanical fall.  Patient was actually out of country leaving the hotel and [place] to return back stateside.  She apparently tripped and fell, landing on her left knee and hitting the right side of her brow.  She opted to catch her scheduled flight, and seek care here.  Patient presents with left knee pain and swelling as well as small lack to the right side of the head after fall.  She denies any LOC, or weakness.  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 [name].   Physical Exam   Triage Vital Signs: ED Triage Vitals  Enc Vitals Group     BP 04/30/22 2021 (!) 154/72     Pulse Rate 04/30/22 2021 72     Resp 04/30/22 2021 19     Temp 04/30/22 2021 98.3 F (36.8 C)     Temp Source 04/30/22 2021 Oral     SpO2 04/30/22 2021 97 %     Weight 04/30/22 2022 205 lb (93 kg)     Height 04/30/22 2022  (1.651 m)     Head Circumference --      Peak Flow --      Pain Score 04/30/22 2026 8     Pain Loc --      Pain Edu? --      Excl. in GC? --     Most recent vital signs: Vitals:   04/30/22 2021  BP: (!) 154/72  Pulse: 72  Resp: 19  Temp: 98.3 F (36.8 C)  SpO2: 97%    General Awake, no distress.  HEENT NCAT, except for a 1.5 cm laceration to the lateral right brow.  No active bleeding is appreciated.  PERRL. EOMI. No rhinorrhea. Mucous membranes are moist. CV:  Good peripheral perfusion.  RESP:  Normal effort.  ABD:  No distention.  MSK:  Left knee with moderate joint effusion appreciated.  Patient with limited flexion range of motion  secondary to joint effusion.   ED Results / Procedures / Treatments   Labs (all labs ordered are listed, but only abnormal results are displayed) Labs Reviewed - No data to display   EKG    RADIOLOGY  I personally viewed and evaluated these images as part of my medical decision making, as well as reviewing the written report by the radiologist.  ED Provider Interpretation: significant Left knee DJD noted}  CT Head Wo Contrast  Result Date: 04/30/2022 CLINICAL DATA:  Tripped and fell, right-sided scalp laceration EXAM: CT HEAD WITHOUT CONTRAST TECHNIQUE: Contiguous axial images were obtained from the base of the skull through the vertex without intravenous contrast. RADIATION DOSE REDUCTION: This exam was performed according to the departmental dose-optimization program which includes automated exposure control, adjustment of the mA and/or kV according to patient size and/or use of iterative reconstruction technique. COMPARISON:  None Available. FINDINGS: Brain: No acute infarct or hemorrhage. Lateral ventricles and midline structures are unremarkable. No acute extra-axial fluid collections. No mass effect. Vascular: No hyperdense vessel or unexpected calcification. Skull: Normal. Negative  for fracture or focal lesion. Sinuses/Orbits: No acute finding. Other: None. IMPRESSION: 1. No acute intracranial process. Electronically Signed   By: Sharlet Salina M.D.   On: 04/30/2022 20:49   DG Knee Complete 4 Views Left  Result Date: 04/30/2022 CLINICAL DATA:  Left knee pain after fall. EXAM: LEFT KNEE - COMPLETE 4+ VIEW COMPARISON:  MRI left knee 09/16/2015 FINDINGS: Severe lateral greater than medial compartment joint space narrowing. Mild scalloping of the lateral tibial plateau is unchanged to mildly worsened from prior MRI. Large peripheral lateral compartment degenerative osteophytes. Severe patellofemoral joint space narrowing and peripheral osteophytosis. No definite joint effusion. There are  loose bodies measuring approximately 2.1 cm and 2.1 cm respectively within the suprapatellar joint space on lateral view. Additional suprapatellar joint space loose bodies are seen within the more posteromedial and posterolateral aspect of the suprapatellar joint space as well, of similar size. Smaller loose body posterior to the proximal fibula on lateral view. IMPRESSION: Severe patellofemoral and lateral compartment and moderate medial compartment osteoarthritis. Multiple large loose bodies as above. Electronically Signed   By: Neita Garnet M.D.   On: 04/30/2022 21:02     PROCEDURES:  Critical Care performed: No  .Joint Aspiration/Arthrocentesis  Date/Time: 04/30/2022 10:10 PM Performed by: Lissa Hoard, PA-C Authorized by: Lissa Hoard, PA-C   Consent:    Consent obtained:  Verbal   Consent given by:  Patient   Risks, benefits, and alternatives were discussed: yes     Risks discussed:  Bleeding, infection and pain   Alternatives discussed:  No treatment Universal protocol:    Test results available: yes     Imaging studies available: yes     Site/side marked: yes     Patient identity confirmed:  Verbally with patient Anesthesia:    Anesthesia method:  Local infiltration   Local anesthetic:  Lidocaine 1% w/o epi Procedure details:    Preparation: Patient was prepped and draped in usual sterile fashion     Needle gauge:  18 G   Ultrasound guidance: no     Approach:  Superior   Aspirate amount:  35   Aspirate characteristics:  Bloody   Steroid injected: no     Specimen collected: no   Post-procedure details:    Dressing:  Sterile dressing   Procedure completion:  Tolerated well, no immediate complications .Marland KitchenLaceration Repair  Date/Time: 04/30/2022 10:31 PM Performed by: Lissa Hoard, PA-C Authorized by: Lissa Hoard, PA-C   Consent:    Consent obtained:  Verbal   Consent given by:  Patient   Risks, benefits, and alternatives  were discussed: yes     Risks discussed:  Pain and poor cosmetic result Universal protocol:    Imaging studies available: yes     Site/side marked: yes     Patient identity confirmed:  Verbally with patient Anesthesia:    Anesthesia method:  Topical application   Topical anesthetic:  Tetracaine gel Laceration details:    Location:  Face   Face location:  R eyebrow   Length (cm):  1.5   Depth (mm):  5 Pre-procedure details:    Preparation:  Patient was prepped and draped in usual sterile fashion Exploration:    Limited defect created (wound extended): no     Contaminated: no   Treatment:    Area cleansed with:  Saline   Amount of cleaning:  Standard   Irrigation solution:  Sterile saline   Irrigation method:  Tap  Debridement:  None   Undermining:  None   Scar revision: no   Skin repair:    Repair method:  Tissue adhesive Approximation:    Approximation:  Close Repair type:    Repair type:  Simple Post-procedure details:    Dressing:  Open (no dressing)   Procedure completion:  Tolerated well, no immediate complications   MEDICATIONS ORDERED IN ED: Medications  lidocaine (PF) (XYLOCAINE) 1 % injection 5 mL (has no administration in time range)  ondansetron (ZOFRAN-ODT) 4 MG disintegrating tablet (  Not Given 04/30/22 2146)  ondansetron (ZOFRAN-ODT) disintegrating tablet 4 mg (4 mg Oral Given 04/30/22 2142)  oxyCODONE-acetaminophen (PERCOCET/ROXICET) 5-325 MG per tablet 1 tablet (1 tablet Oral Given 04/30/22 2142)     IMPRESSION / MDM / ASSESSMENT AND PLAN / ED COURSE  I reviewed the triage vital signs and the nursing notes.                              Differential diagnosis includes, but is not limited to, closed head injury, SDH, patella fracture, tibial plateau fracture, joint contusion, knee effusion   Patient to the ED for evaluation of injury sustained following mechanical fall.  Patient presents in no acute distress from the [place] where she  sustained a fall.  She presents with laceration over the right brow and an effusion of the left knee.  Patient consents to suture repair as well as a joint aspiration for therapeutic benefit.  The facial laceration was repaired using Dermabond with good wound edge approximation.  The aspiration of the left knee notes a bloody effusion consistent with the patient's trauma.  No radiologic evidence of any acute fracture or dislocation.  CT of the head is without any signs of acute intracranial process.  Patient's diagnosis is consistent with facial contusion/laceration as well as a traumatic effusion to the left knee. Patient will be discharged home with prescriptions for diclofenac and Flexeril. Patient is to follow up with Doctor Who did as needed or otherwise directed. Patient is given ED precautions to return to the ED for any worsening or new symptoms.   FINAL CLINICAL IMPRESSION(S) / ED DIAGNOSES   Final diagnoses:  Facial laceration, initial encounter  Traumatic effusion of knee joint     Rx / DC Orders   ED Discharge Orders          Ordered    cyclobenzaprine (FLEXERIL) 5 MG tablet  3 times daily PRN        04/30/22 2234    diclofenac (VOLTAREN) 75 MG EC tablet  2 times daily        04/30/22 2234             Note:  This document was prepared using Dragon voice recognition software and may include unintentional dictation errors.    Melvenia Needles, PA-C 04/30/22 2247    Lavonia Drafts, MD 04/30/22 2255

## 2022-04-30 NOTE — ED Triage Notes (Signed)
Pt arrives with c/o left knee pain and a small lac on right side of head after having a fall. Pt was walking and tripped. Pt denies LOC. Bleeding controlled in triage.

## 2022-04-30 NOTE — Discharge Instructions (Addendum)
Your head CT and knee XR are essentially normal. No acute injuries are noted. Follow-up with Dr. [name] for continued knee pain. Take the Diclofenac in lieu of your previously prescribed Meloxicam (Mobic) for acute knee pain. Rest, ice, and elevate the knee. Avoid any creams, lotions, ointment on the wound glue.

## 2022-05-07 ENCOUNTER — Ambulatory Visit
Admission: RE | Admit: 2022-05-07 | Discharge: 2022-05-07 | Disposition: A | Discharge status: Home / Self Care | Payer: BC Managed Care – PPO | Attending: Family Medicine | Admitting: Family Medicine

## 2022-05-07 ENCOUNTER — Ambulatory Visit
Admission: RE | Admit: 2022-05-07 | Discharge: 2022-05-07 | Disposition: A | Discharge status: Home / Self Care | Payer: BC Managed Care – PPO | Source: Ambulatory Visit | Attending: Family Medicine | Admitting: Family Medicine

## 2022-05-07 ENCOUNTER — Other Ambulatory Visit: Payer: Self-pay | Admitting: Family Medicine

## 2022-05-07 DIAGNOSIS — M25511 Pain in right shoulder: Secondary | ICD-10-CM | POA: Insufficient documentation

## 2022-05-07 DIAGNOSIS — R0781 Pleurodynia: Secondary | ICD-10-CM | POA: Insufficient documentation

## 2022-10-29 ENCOUNTER — Ambulatory Visit: Admit: 2022-10-29 | Payer: BC Managed Care – PPO | Admitting: Orthopedic Surgery

## 2022-10-29 SURGERY — ARTHROPLASTY, KNEE, TOTAL, USING IMAGELESS COMPUTER-ASSISTED NAVIGATION
Anesthesia: Choice | Site: Knee | Laterality: Left

## 2022-11-21 ENCOUNTER — Other Ambulatory Visit: Payer: Self-pay | Admitting: Family Medicine

## 2022-11-21 DIAGNOSIS — Z1231 Encounter for screening mammogram for malignant neoplasm of breast: Secondary | ICD-10-CM

## 2022-11-22 ENCOUNTER — Ambulatory Visit
Admission: RE | Admit: 2022-11-22 | Discharge: 2022-11-22 | Disposition: A | Discharge status: Home / Self Care | Payer: BC Managed Care – PPO | Source: Ambulatory Visit | Attending: Family Medicine | Admitting: Family Medicine

## 2022-11-22 DIAGNOSIS — Z1231 Encounter for screening mammogram for malignant neoplasm of breast: Secondary | ICD-10-CM | POA: Diagnosis present

## 2022-12-25 ENCOUNTER — Ambulatory Visit: Payer: BC Managed Care – PPO | Admitting: Podiatry

## 2022-12-25 DIAGNOSIS — L600 Ingrowing nail: Secondary | ICD-10-CM

## 2022-12-25 NOTE — Progress Notes (Signed)
   Chief Complaint  Patient presents with   Plantar Warts    Right foot plantar wart follow-up, bilateral hallux ingrown toenails, patient great toe have been sore for 2 months,     Subjective: Patient presents today for evaluation of pain to the medial and lateral border of the bilateral great toes.  Is very sensitive to touch.. Patient is concerned for possible ingrown nail.  It is very sensitive to touch.    Patient also states that she has a very sensitive callus to the lateral aspect of the right foot.  It has been present for few months now.  Patient presents today for further treatment and evaluation.  Past Medical History:  Diagnosis Date   Arthritis    Asthma    Complication of anesthesia    Woke during IVC filter removal   Difficult intubation    pt denies   DVT, lower extremity ([place]) 01/15/2015   right leg that went to lung   GERD (gastroesophageal reflux disease)    History of hiatal hernia    Pulmonary embolism ([place]) 01/15/2015    Objective:  General: Well developed, nourished, in no acute distress, alert and oriented x3   Dermatology: Skin is warm, dry and supple bilateral.  Lateral border bilateral great toes is tender with evidence of an ingrowing nail. Pain on palpation noted to the border of the nail fold. The remaining nails appear unremarkable at this time.  Hyperkeratotic callus noted around the fifth metatarsal tubercle of the right foot with associated tenderness to palpation  Vascular: DP and PT pulses palpable.  No clinical evidence of vascular compromise  Neruologic: Grossly intact via light touch bilateral.  Musculoskeletal: No pedal deformity noted  Assesement: #1 Paronychia with ingrowing nail medial and lateral border bilateral great toes #2 symptomatic callus fifth metatarsal tubercle right  Plan of Care:  1. Patient evaluated.  2. Discussed treatment alternatives and plan of care. Explained nail avulsion procedure and post procedure course  to patient. 3.  After discussing further with the patient we will pursue conservative treatment for now.  Mechanical debridement of the bilateral toenails was performed today and the corners of the nails were debrided away from the medial and lateral nail folds.  The patient felt significant relief.  She tolerated this well 4.  Excisional debridement of the hyperkeratotic callus lesion was also performed to the right fifth metatarsal tubercle area and she did feel relief as well. 5.  Continue wearing wide fitting shoes that do not irritate or constrict the toebox area 6.  Return to clinic in 4 weeks, if she continues to have symptoms and tenderness we will proceed with partial nail matricectomies  *Hospice nurse  Edrick Kins, DPM [facility]  Dr. Edrick Kins, DPM    [address], [place] [address]                Office [phone]  Fax [phone]

## 2023-01-22 ENCOUNTER — Ambulatory Visit: Payer: BC Managed Care – PPO | Admitting: Podiatry

## 2023-03-19 ENCOUNTER — Ambulatory Visit (INDEPENDENT_AMBULATORY_CARE_PROVIDER_SITE_OTHER): Payer: BC Managed Care – PPO | Admitting: Vascular Surgery

## 2023-03-19 ENCOUNTER — Encounter (INDEPENDENT_AMBULATORY_CARE_PROVIDER_SITE_OTHER): Payer: Self-pay | Admitting: Vascular Surgery

## 2023-03-19 VITALS — BP 102/66 | HR 74 | Resp 16 | Wt 192.0 lb

## 2023-03-19 DIAGNOSIS — I2699 Other pulmonary embolism without acute cor pulmonale: Secondary | ICD-10-CM

## 2023-03-19 DIAGNOSIS — Z95828 Presence of other vascular implants and grafts: Secondary | ICD-10-CM | POA: Diagnosis not present

## 2023-03-19 DIAGNOSIS — I10 Essential (primary) hypertension: Secondary | ICD-10-CM

## 2023-03-19 DIAGNOSIS — E119 Type 2 diabetes mellitus without complications: Secondary | ICD-10-CM

## 2023-03-19 NOTE — H&P (View-Only) (Signed)
Patient ID: Erica Powers, female   DOB: [birth year], 62 y.o.   MRN: [id number]  Chief Complaint  Patient presents with   New Patient (Initial Visit)    Ref [place] consult IVC filter prior to knee surgery    HPI [name] is a 62 y.o. female.  I am asked to see the patient by Dr. [name] for evaluation for IVC filter placement.  The patient has a left knee replacement scheduled for May 29.  She has a previous history of DVT and pulmonary embolus about 4 years ago.  Given this, she is extremely high risk for thromboembolic complications around the time of her knee replacement.     Past Medical History:  Diagnosis Date   Arthritis    Asthma    Complication of anesthesia    Woke during IVC filter removal   Difficult intubation    pt denies   DVT, lower extremity 01/15/2015   right leg that went to lung   GERD (gastroesophageal reflux disease)    History of hiatal hernia    Pulmonary embolism 01/15/2015    Past Surgical History:  Procedure Laterality Date   BREAST BIOPSY Bilateral    [facility]at bx done neg at [facility]    BREAST SURGERY  2000   Reduction   CHOLECYSTECTOMY     COLONOSCOPY N/A 04/08/2015   Procedure: COLONOSCOPY;  Surgeon: Midge Minium, MD;  Location[facility];  Service: Gastroenterology;  Laterality: N/A;   COLONOSCOPY WITH PROPOFOL N/A 09/29/2020   Procedure: COLONOSCOPY WITH PROPOFOL;  Surgeon: Toney Reil, MD;  Location: [facility];  Service: Endoscopy;  Laterality: N/A;   ESOPHAGOGASTRODUODENOSCOPY (EGD) WITH PROPOFOL N/A 07/07/2015   Procedure: ESOPHAGOGASTRODUODENOSCOPY (EGD) WITH PROPOFOL;  Surgeon: Midge Minium, MD;  Location: [facility];  Service: Endoscopy;  Laterality: N/A;   ESOPHAGOGASTRODUODENOSCOPY (EGD) WITH PROPOFOL N/A 09/29/2020   Procedure: ESOPHAGOGASTRODUODENOSCOPY (EGD) WITH PROPOFOL;  Surgeon: Toney Reil, MD;  Location: [facility];  Service: Endoscopy;  Laterality: N/A;   GASTRIC ROUX-EN-Y  N/A 11/01/2015   Procedure: LAPAROSCOPIC ROUX-EN-Y GASTRIC, HIATAL HERNIA REPAIR, EXTENSIVE LYSIS OF ADHESIONS;  Surgeon: Everette Rank, MD;  Location: [facility] ORS;  Service: General;  Laterality: N/A;   HEEL SPUR SURGERY Bilateral    IVC FILTER INSERTION N/A 01/19/2019   Procedure: IVC FILTER INSERTION;  Surgeon: Annice Needy, MD;  Location: [facility] INVASIVE CV LAB;  Service: Cardiovascular;  Laterality: N/A;   IVC FILTER REMOVAL  04/16/2019   IVC FILTER REMOVAL N/A 04/16/2019   Procedure: IVC FILTER REMOVAL;  Surgeon: Annice Needy, MD;  Location: [facility] INVASIVE CV LAB;  Service: Cardiovascular;  Laterality: N/A;   REDUCTION MAMMAPLASTY Bilateral 2000   ROTATOR CUFF REPAIR Right 2009   TONSILLECTOMY     TOTAL KNEE ARTHROPLASTY Right 01/21/2019   Procedure: TOTAL KNEE ARTHROPLASTY with navigation;  Surgeon: Donato Heinz, MD;  Location: [facility] ORS;  Service: Orthopedics;  Laterality: Right;   TUBAL LIGATION       Family History  Problem Relation Age of Onset   Diabetes Mother    Hypertension Mother    Breast cancer Neg Hx       Social History   Tobacco Use   Smoking status: Never   Smokeless tobacco: Never  Vaping Use   Vaping Use: Never used  Substance Use Topics   Alcohol use: Not Currently   Drug use: No     Allergies  Allergen Reactions   Codeine Itching  Hydrocodone Itching    Current Outpatient Medications  Medication Sig Dispense Refill   acetaminophen (TYLENOL) 500 MG tablet Take 500 mg by mouth every 6 (six) hours as needed.     albuterol (PROVENTIL HFA;VENTOLIN HFA) 108 (90 Base) MCG/ACT inhaler Inhale 1-2 puffs into the lungs every 4 (four) hours as needed for wheezing or shortness of breath.      azelastine (ASTELIN) 0.1 % nasal spray USE 1 TO 2 SPRAYS IN EACH NOSTRIL EVERY 12 HOURS AS NEEDED FOR ALLERGIES     budesonide-formoterol (SYMBICORT) 80-4.5 MCG/ACT inhaler Inhale into the lungs.     Calcium Carbonate-Vit D-Min (CALCIUM 600+D PLUS MINERALS) 600-400  MG-UNIT TABS Take by mouth.     cyclobenzaprine (FLEXERIL) 5 MG tablet Take 1 tablet (5 mg total) by mouth 3 (three) times daily as needed. 15 tablet 0   ferrous sulfate 325 (65 FE) MG EC tablet Take by mouth.     meloxicam (MOBIC) 15 MG tablet TAKE 1 TABLET BY MOUTH DAILY FOR 10 DAYS THEN AS NEEDED FOR HIP PAIN     Multiple Vitamin (MULTI-VITAMIN) tablet Take by mouth.     cyanocobalamin (,VITAMIN B-12,) 1000 MCG/ML injection Inject 1ml once a week for 4 weeks then once a month (Patient not taking: Reported on 03/19/2023) 8 mL 0   Vitamin D, Ergocalciferol, (DRISDOL) 1.25 MG (50000 UNIT) CAPS capsule TAKE 1 CAPSULE BY MOUTH EVERY 7 DAYS 12 capsule 0   No current facility-administered medications for this visit.      REVIEW OF SYSTEMS (Negative unless checked)  Constitutional: [] Weight loss  [] Fever  [] Chills Cardiac: [] Chest pain   [] Chest pressure   [] Palpitations   [] Shortness of breath when laying flat   [] Shortness of breath at rest   [] Shortness of breath with exertion. Vascular:  [] Pain in legs with walking   [] Pain in legs at rest   [] Pain in legs when laying flat   [] Claudication   [] Pain in feet when walking  [] Pain in feet at rest  [] Pain in feet when laying flat   [x] History of DVT   [] Phlebitis   [] Swelling in legs   [] Varicose veins   [] Non-healing ulcers Pulmonary:   [] Uses home oxygen   [] Productive cough   [] Hemoptysis   [] Wheeze  [] COPD   [] Asthma Neurologic:  [] Dizziness  [] Blackouts   [] Seizures   [] History of stroke   [] History of TIA  [] Aphasia   [] Temporary blindness   [] Dysphagia   [] Weakness or numbness in arms   [] Weakness or numbness in legs Musculoskeletal:  [x] Arthritis   [] Joint swelling   [x] Joint pain   [] Low back pain Hematologic:  [] Easy bruising  [] Easy bleeding   [] Hypercoagulable state   [] Anemic  [] Hepatitis Gastrointestinal:  [] Blood in stool   [] Vomiting blood  [] Gastroesophageal reflux/heartburn   [] Abdominal pain Genitourinary:  [] Chronic kidney disease    [] Difficult urination  [] Frequent urination  [] Burning with urination   [] Hematuria Skin:  [] Rashes   [] Ulcers   [] Wounds Psychological:  [] History of anxiety   []  History of major depression.    Physical Exam BP 102/66 (BP Location: Right Arm)   Pulse 74   Resp 16   Wt 192 lb (87.1 kg)   BMI 31.95 kg/m  Gen:  WD/WN, NAD Head: [place]/AT, No temporalis wasting.  Ear/Nose/Throat: Hearing grossly intact, nares w/o erythema or drainage, oropharynx w/o Erythema/Exudate Eyes: Conjunctiva clear, sclera non-icteric  Neck: trachea midline.  No JVD.  Pulmonary:  Good air movement, respirations not labored, no  use of accessory muscles  Cardiac: RRR, no JVD Vascular:  Vessel Right Left  Radial Palpable Palpable                                   Gastrointestinal:. No masses, surgical incisions, or scars. Musculoskeletal: M/S 5/5 throughout.  Extremities without ischemic changes.  No deformity or atrophy. No edema. Neurologic: Sensation grossly intact in extremities.  Symmetrical.  Speech is fluent. Motor exam as listed above. Psychiatric: Judgment intact, Mood & affect appropriate for pt's clinical situation. Dermatologic: No rashes or ulcers noted.  No cellulitis or open wounds.    Radiology No results found.  Labs No results found for this or any previous visit (from the past 2160 hour(s)).  Assessment/Plan:  Pulmonary emboli (HCC) Patient does have a history of DVT and pulmonary embolus and will be a very high risk for thromboembolic complications around a major operation such as a total knee replacement.  In discussions with the patient, I did recommend having an IVC filter prior to her knee replacement next month.  After reviewing her chart, she actually already has an IVC filter as it was unable to be removed back in 2020.  This has become a permanent IVC filter and we will not need to place a new filter prior to her knee replacement.  DVT prophylaxis is much as possible would  still be beneficial to avoid DVT in the perioperative period.  Essential (primary) hypertension blood pressure control important in reducing the progression of atherosclerotic disease. On appropriate oral medications.   Diabetes mellitus, type 2 blood glucose control important in reducing the progression of atherosclerotic disease. Also, involved in wound healing. On appropriate medications.   S/P IVC filter Previous filter could not be removed so that this is in place and will be protective in the perioperative period for her knee replacement.      Festus Barren 03/20/2023, 3:55 PM   This note was created with Dragon medical transcription system.  Any errors from dictation are unintentional.

## 2023-03-19 NOTE — Progress Notes (Signed)
Patient ID: Erica Powers, female   DOB: [birth year], 62 y.o.   MRN: [id number]  Chief Complaint  Patient presents with   New Patient (Initial Visit)    Ref [place] consult IVC filter prior to knee surgery    HPI Erica Powers is a 62 y.o. female.  I am asked to see the patient by Dr. [name] for evaluation for IVC filter placement.  The patient has a left knee replacement scheduled for May 29.  She has a previous history of DVT and pulmonary embolus about 4 years ago.  Given this, she is extremely high risk for thromboembolic complications around the time of her knee replacement.     Past Medical History:  Diagnosis Date   Arthritis    Asthma    Complication of anesthesia    Woke during IVC filter removal   Difficult intubation    pt denies   DVT, lower extremity 01/15/2015   right leg that went to lung   GERD (gastroesophageal reflux disease)    History of hiatal hernia    Pulmonary embolism 01/15/2015    Past Surgical History:  Procedure Laterality Date   BREAST BIOPSY Bilateral    [facility] neg [facility]    BREAST SURGERY  2000   Reduction   CHOLECYSTECTOMY     COLONOSCOPY N/A 04/08/2015   Procedure: COLONOSCOPY;  Surgeon: Midge Minium, MD;  Location: [facility];  Service: Gastroenterology;  Laterality: N/A;   COLONOSCOPY WITH PROPOFOL N/A 09/29/2020   Procedure: COLONOSCOPY WITH PROPOFOL;  Surgeon: Toney Reil, MD;  Location: [facility];  Service: Endoscopy;  Laterality: N/A;   ESOPHAGOGASTRODUODENOSCOPY (EGD) WITH PROPOFOL N/A 07/07/2015   Procedure: ESOPHAGOGASTRODUODENOSCOPY (EGD) WITH PROPOFOL;  Surgeon: Midge Minium, MD;  Location: [facility];  Service: Endoscopy;  Laterality: N/A;   ESOPHAGOGASTRODUODENOSCOPY (EGD) WITH PROPOFOL N/A 09/29/2020   Procedure: ESOPHAGOGASTRODUODENOSCOPY (EGD) WITH PROPOFOL;  Surgeon: Toney Reil, MD;  Location: [facility];  Service: Endoscopy;  Laterality: N/A;   GASTRIC ROUX-EN-Y  N/A 11/01/2015   Procedure: LAPAROSCOPIC ROUX-EN-Y GASTRIC, HIATAL HERNIA REPAIR, EXTENSIVE LYSIS OF ADHESIONS;  Surgeon: Everette Rank, MD;  Location: [facility] ORS;  Service: General;  Laterality: N/A;   HEEL SPUR SURGERY Bilateral    IVC FILTER INSERTION N/A 01/19/2019   Procedure: IVC FILTER INSERTION;  Surgeon: Annice Needy, MD;  Location: [facility] INVASIVE CV LAB;  Service: Cardiovascular;  Laterality: N/A;   IVC FILTER REMOVAL  04/16/2019   IVC FILTER REMOVAL N/A 04/16/2019   Procedure: IVC FILTER REMOVAL;  Surgeon: Annice Needy, MD;  Location: [facility] INVASIVE CV LAB;  Service: Cardiovascular;  Laterality: N/A;   REDUCTION MAMMAPLASTY Bilateral 2000   ROTATOR CUFF REPAIR Right 2009   TONSILLECTOMY     TOTAL KNEE ARTHROPLASTY Right 01/21/2019   Procedure: TOTAL KNEE ARTHROPLASTY with navigation;  Surgeon: Donato Heinz, MD;  Location: [facility] ORS;  Service: Orthopedics;  Laterality: Right;   TUBAL LIGATION       Family History  Problem Relation Age of Onset   Diabetes Mother    Hypertension Mother    Breast cancer Neg Hx       Social History   Tobacco Use   Smoking status: Never   Smokeless tobacco: Never  Vaping Use   Vaping Use: Never used  Substance Use Topics   Alcohol use: Not Currently   Drug use: No     Allergies  Allergen Reactions   Codeine Itching  Hydrocodone Itching    Current Outpatient Medications  Medication Sig Dispense Refill   acetaminophen (TYLENOL) 500 MG tablet Take 500 mg by mouth every 6 (six) hours as needed.     albuterol (PROVENTIL HFA;VENTOLIN HFA) 108 (90 Base) MCG/ACT inhaler Inhale 1-2 puffs into the lungs every 4 (four) hours as needed for wheezing or shortness of breath.      azelastine (ASTELIN) 0.1 % nasal spray USE 1 TO 2 SPRAYS IN EACH NOSTRIL EVERY 12 HOURS AS NEEDED FOR ALLERGIES     budesonide-formoterol (SYMBICORT) 80-4.5 MCG/ACT inhaler Inhale into the lungs.     Calcium Carbonate-Vit D-Min (CALCIUM 600+D PLUS MINERALS) 600-400  MG-UNIT TABS Take by mouth.     cyclobenzaprine (FLEXERIL) 5 MG tablet Take 1 tablet (5 mg total) by mouth 3 (three) times daily as needed. 15 tablet 0   ferrous sulfate 325 (65 FE) MG EC tablet Take by mouth.     meloxicam (MOBIC) 15 MG tablet TAKE 1 TABLET BY MOUTH DAILY FOR 10 DAYS THEN AS NEEDED FOR HIP PAIN     Multiple Vitamin (MULTI-VITAMIN) tablet Take by mouth.     cyanocobalamin (,VITAMIN B-12,) 1000 MCG/ML injection Inject 1ml once a week for 4 weeks then once a month (Patient not taking: Reported on 03/19/2023) 8 mL 0   Vitamin D, Ergocalciferol, (DRISDOL) 1.25 MG (50000 UNIT) CAPS capsule TAKE 1 CAPSULE BY MOUTH EVERY 7 DAYS 12 capsule 0   No current facility-administered medications for this visit.      REVIEW OF SYSTEMS (Negative unless checked)  Constitutional: [] Weight loss  [] Fever  [] Chills Cardiac: [] Chest pain   [] Chest pressure   [] Palpitations   [] Shortness of breath when laying flat   [] Shortness of breath at rest   [] Shortness of breath with exertion. Vascular:  [] Pain in legs with walking   [] Pain in legs at rest   [] Pain in legs when laying flat   [] Claudication   [] Pain in feet when walking  [] Pain in feet at rest  [] Pain in feet when laying flat   [x] History of DVT   [] Phlebitis   [] Swelling in legs   [] Varicose veins   [] Non-healing ulcers Pulmonary:   [] Uses home oxygen   [] Productive cough   [] Hemoptysis   [] Wheeze  [] COPD   [] Asthma Neurologic:  [] Dizziness  [] Blackouts   [] Seizures   [] History of stroke   [] History of TIA  [] Aphasia   [] Temporary blindness   [] Dysphagia   [] Weakness or numbness in arms   [] Weakness or numbness in legs Musculoskeletal:  [x] Arthritis   [] Joint swelling   [x] Joint pain   [] Low back pain Hematologic:  [] Easy bruising  [] Easy bleeding   [] Hypercoagulable state   [] Anemic  [] Hepatitis Gastrointestinal:  [] Blood in stool   [] Vomiting blood  [] Gastroesophageal reflux/heartburn   [] Abdominal pain Genitourinary:  [] Chronic kidney disease    [] Difficult urination  [] Frequent urination  [] Burning with urination   [] Hematuria Skin:  [] Rashes   [] Ulcers   [] Wounds Psychological:  [] History of anxiety   []  History of major depression.    Physical Exam BP 102/66 (BP Location: Right Arm)   Pulse 74   Resp 16   Wt 192 lb (87.1 kg)   BMI 31.95 kg/m  Gen:  WD/WN, NAD Head: [place]/AT, No temporalis wasting.  Ear/Nose/Throat: Hearing grossly intact, nares w/o erythema or drainage, oropharynx w/o Erythema/Exudate Eyes: Conjunctiva clear, sclera non-icteric  Neck: trachea midline.  No JVD.  Pulmonary:  Good air movement, respirations not labored, no  use of accessory muscles  Cardiac: RRR, no JVD Vascular:  Vessel Right Left  Radial Palpable Palpable                                   Gastrointestinal:. No masses, surgical incisions, or scars. Musculoskeletal: M/S 5/5 throughout.  Extremities without ischemic changes.  No deformity or atrophy. No edema. Neurologic: Sensation grossly intact in extremities.  Symmetrical.  Speech is fluent. Motor exam as listed above. Psychiatric: Judgment intact, Mood & affect appropriate for pt's clinical situation. Dermatologic: No rashes or ulcers noted.  No cellulitis or open wounds.    Radiology No results found.  Labs No results found for this or any previous visit (from the past 2160 hour(s)).  Assessment/Plan:  Pulmonary emboli (HCC) Patient does have a history of DVT and pulmonary embolus and will be a very high risk for thromboembolic complications around a major operation such as a total knee replacement.  In discussions with the patient, I did recommend having an IVC filter prior to her knee replacement next month.  After reviewing her chart, she actually already has an IVC filter as it was unable to be removed back in 2020.  This has become a permanent IVC filter and we will not need to place a new filter prior to her knee replacement.  DVT prophylaxis is much as possible would  still be beneficial to avoid DVT in the perioperative period.  Essential (primary) hypertension blood pressure control important in reducing the progression of atherosclerotic disease. On appropriate oral medications.   Diabetes mellitus, type 2 blood glucose control important in reducing the progression of atherosclerotic disease. Also, involved in wound healing. On appropriate medications.   S/P IVC filter Previous filter could not be removed so that this is in place and will be protective in the perioperative period for her knee replacement.      Festus Barren 03/20/2023, 3:55 PM   This note was created with Dragon medical transcription system.  Any errors from dictation are unintentional.

## 2023-03-20 ENCOUNTER — Telehealth (INDEPENDENT_AMBULATORY_CARE_PROVIDER_SITE_OTHER): Payer: Self-pay

## 2023-03-20 NOTE — Telephone Encounter (Signed)
I attempted to contact the patient to explain that she has a IVC filter that is still in place since it was put in 01/19/2019 as it was not taken out on 04/16/23. Per Dr. [name] this will continue to do what it was meant to do as far as clot protection. A message was left for a return call.

## 2023-03-20 NOTE — Assessment & Plan Note (Signed)
Patient does have a history of DVT and pulmonary embolus and will be a very high risk for thromboembolic complications around a major operation such as a total knee replacement.  In discussions with the patient, I did recommend having an IVC filter prior to her knee replacement next month.  After reviewing her chart, she actually already has an IVC filter as it was unable to be removed back in 2020.  This has become a permanent IVC filter and we will not need to place a new filter prior to her knee replacement.  DVT prophylaxis is much as possible would still be beneficial to avoid DVT in the perioperative period.

## 2023-03-20 NOTE — Assessment & Plan Note (Signed)
Previous filter could not be removed so that this is in place and will be protective in the perioperative period for her knee replacement.

## 2023-03-20 NOTE — Assessment & Plan Note (Signed)
blood glucose control important in reducing the progression of atherosclerotic disease. Also, involved in wound healing. On appropriate medications.  

## 2023-03-20 NOTE — Assessment & Plan Note (Signed)
blood pressure control important in reducing the progression of atherosclerotic disease. On appropriate oral medications.  

## 2023-03-21 ENCOUNTER — Telehealth (INDEPENDENT_AMBULATORY_CARE_PROVIDER_SITE_OTHER): Payer: Self-pay

## 2023-03-21 NOTE — Telephone Encounter (Addendum)
Spoke with the patient and she is schedule with Dr. [name] on 04/15/23 for a IVC filter placement with a 6:45 am arrival time to the [facility]. Pre-procedure instructions were discussed and will be sent to Mychart and mailed. Patient called back and was given the information from Dr. [name]. The patient then stated she went to [facility] to have the filter removed there instead.

## 2023-04-15 ENCOUNTER — Encounter
Admission: RE | Disposition: A | Discharge status: Home / Self Care | Payer: Self-pay | Source: Home / Self Care | Attending: Vascular Surgery

## 2023-04-15 ENCOUNTER — Ambulatory Visit
Admission: RE | Admit: 2023-04-15 | Discharge: 2023-04-15 | Disposition: A | Discharge status: Home / Self Care | Payer: BC Managed Care – PPO | Attending: Vascular Surgery | Admitting: Vascular Surgery

## 2023-04-15 ENCOUNTER — Other Ambulatory Visit: Payer: Self-pay

## 2023-04-15 ENCOUNTER — Encounter: Payer: Self-pay | Admitting: Vascular Surgery

## 2023-04-15 DIAGNOSIS — E119 Type 2 diabetes mellitus without complications: Secondary | ICD-10-CM | POA: Diagnosis not present

## 2023-04-15 DIAGNOSIS — Z833 Family history of diabetes mellitus: Secondary | ICD-10-CM | POA: Diagnosis not present

## 2023-04-15 DIAGNOSIS — Z86711 Personal history of pulmonary embolism: Secondary | ICD-10-CM | POA: Insufficient documentation

## 2023-04-15 DIAGNOSIS — Z86718 Personal history of other venous thrombosis and embolism: Secondary | ICD-10-CM | POA: Insufficient documentation

## 2023-04-15 DIAGNOSIS — Z8249 Family history of ischemic heart disease and other diseases of the circulatory system: Secondary | ICD-10-CM | POA: Diagnosis not present

## 2023-04-15 DIAGNOSIS — I10 Essential (primary) hypertension: Secondary | ICD-10-CM | POA: Insufficient documentation

## 2023-04-15 DIAGNOSIS — Z408 Encounter for other prophylactic surgery: Secondary | ICD-10-CM

## 2023-04-15 DIAGNOSIS — Z4589 Encounter for adjustment and management of other implanted devices: Secondary | ICD-10-CM | POA: Insufficient documentation

## 2023-04-15 DIAGNOSIS — I82409 Acute embolism and thrombosis of unspecified deep veins of unspecified lower extremity: Secondary | ICD-10-CM

## 2023-04-15 HISTORY — PX: IVC FILTER INSERTION: CATH118245

## 2023-04-15 SURGERY — IVC FILTER INSERTION
Anesthesia: Moderate Sedation

## 2023-04-15 MED ORDER — SODIUM CHLORIDE 0.9 % IV SOLN
INTRAVENOUS | Status: DC
Start: 2023-04-15 — End: 2023-04-15

## 2023-04-15 MED ORDER — IODIXANOL 320 MG/ML IV SOLN
INTRAVENOUS | Status: DC | PRN
  Administered 2023-04-15: 35 mL

## 2023-04-15 MED ORDER — FENTANYL CITRATE PF 50 MCG/ML IJ SOSY
PREFILLED_SYRINGE | INTRAMUSCULAR | Status: AC
  Filled 2023-04-15: qty 1

## 2023-04-15 MED ORDER — MIDAZOLAM HCL 2 MG/2ML IJ SOLN
INTRAMUSCULAR | Status: AC
  Filled 2023-04-15: qty 2

## 2023-04-15 MED ORDER — HYDROMORPHONE HCL 1 MG/ML IJ SOLN: 1.0000 mg | Freq: Once | INTRAMUSCULAR | Status: DC | PRN

## 2023-04-15 MED ORDER — DIPHENHYDRAMINE HCL 50 MG/ML IJ SOLN: 50.0000 mg | Freq: Once | INTRAMUSCULAR | Status: DC | PRN

## 2023-04-15 MED ORDER — CEFAZOLIN SODIUM-DEXTROSE 2-4 GM/100ML-% IV SOLN
INTRAVENOUS | Status: AC
  Filled 2023-04-15: qty 100

## 2023-04-15 MED ORDER — ONDANSETRON HCL 4 MG/2ML IJ SOLN: 4.0000 mg | Freq: Four times a day (QID) | INTRAMUSCULAR | Status: DC | PRN

## 2023-04-15 MED ORDER — MIDAZOLAM HCL 2 MG/2ML IJ SOLN
INTRAMUSCULAR | Status: DC | PRN
  Administered 2023-04-15: 1 mg via INTRAVENOUS
  Administered 2023-04-15: 2 mg via INTRAVENOUS

## 2023-04-15 MED ORDER — FENTANYL CITRATE (PF) 100 MCG/2ML IJ SOLN
INTRAMUSCULAR | Status: DC | PRN
  Administered 2023-04-15: 25 ug via INTRAVENOUS
  Administered 2023-04-15: 50 ug via INTRAVENOUS

## 2023-04-15 MED ORDER — FAMOTIDINE 20 MG PO TABS: 40.0000 mg | ORAL_TABLET | Freq: Once | ORAL | Status: DC | PRN

## 2023-04-15 MED ORDER — METHYLPREDNISOLONE SODIUM SUCC 125 MG IJ SOLR: 125.0000 mg | Freq: Once | INTRAMUSCULAR | Status: DC | PRN

## 2023-04-15 MED ORDER — MIDAZOLAM HCL 2 MG/ML PO SYRP: 8.0000 mg | ORAL_SOLUTION | Freq: Once | ORAL | Status: DC | PRN

## 2023-04-15 MED ORDER — CEFAZOLIN SODIUM-DEXTROSE 2-4 GM/100ML-% IV SOLN
2.0000 g | INTRAVENOUS | Status: AC
  Administered 2023-04-15: 2 g via INTRAVENOUS

## 2023-04-15 SURGICAL SUPPLY — 8 items
CATH BEACON 5 .035 40 KMP TP (CATHETERS) IMPLANT
CATH BEACON 5 .038 40 KMP TP (CATHETERS) ×1
COVER PROBE ULTRASOUND 5X96 (MISCELLANEOUS) IMPLANT
GUIDEWIRE SUPER STIFF .035X180 (WIRE) IMPLANT
KIT FEM OPTION ELITE FILTER (Filter) IMPLANT
PACK ANGIOGRAPHY (CUSTOM PROCEDURE TRAY) ×1 IMPLANT
SHEATH BRITE TIP 5FRX11 (SHEATH) IMPLANT
WIRE GUIDERIGHT .035X150 (WIRE) IMPLANT

## 2023-04-15 NOTE — Interval H&P Note (Signed)
History and Physical Interval Note:  04/15/2023 8:12 AM  Erica Powers  has presented today for surgery, with the diagnosis of IVC filter placement   DVT.  The various methods of treatment have been discussed with the patient and family. After consideration of risks, benefits and other options for treatment, the patient has consented to  Procedure(s): IVC FILTER INSERTION (N/A) as a surgical intervention.  The patient's history has been reviewed, patient examined, no change in status, stable for surgery.  I have reviewed the patient's chart and labs.  Questions were answered to the patient's satisfaction.     Festus Barren

## 2023-04-15 NOTE — Op Note (Signed)
[facility]   OPERATIVE NOTE    PRE-OPERATIVE DIAGNOSIS: History of DVT and upcoming major orthopedic surgery  POST-OPERATIVE DIAGNOSIS: same as above  PROCEDURE: 1.   Ultrasound guidance for vascular access to the right femoral vein 2.   Catheter placement into the inferior vena cava 3.   Inferior venacavogram and right iliofemoral venogram 4.   Placement of a option Elite IVC filter  SURGEON: Festus Barren, MD  ASSISTANT(S): None  ANESTHESIA: local with Moderate Conscious Sedation for approximately 18 minutes using 3 mg of Versed and 75 mcg of Fentanyl  ESTIMATED BLOOD LOSS: minimal  CONTRAST: 35 cc  FLUORO TIME: less than one minute  FINDING(S): 1.  Patent IVC. Patent right femoral and iliac veins with sharp angle at the confluence of the iliac veins  SPECIMEN(S):  none  INDICATIONS:   Erica Powers is a 62 y.o. female who presents with a history of thrombotic issues and upcoming major orthopedic surgery.  Inferior vena cava filter is indicated for this reason.  Risks and benefits including filter thrombosis, migration, fracture, bleeding, and infection were all discussed.  We discussed that all IVC filters that we place can be removed if desired from the patient once the need for the filter has passed.    DESCRIPTION: After obtaining full informed written consent, the patient was brought back to the vascular suite. The skin was sterilely prepped and draped in a sterile surgical field was created. Moderate conscious sedation was administered during a face to face encounter with the patient throughout the procedure with my supervision of the RN administering medicines and monitoring the patient's vital signs, pulse oximetry, telemetry and mental status throughout from the start of the procedure until the patient was taken to the recovery room. The right femoral was accessed under direct ultrasound guidance without difficulty with a Seldinger needle and a  J-wire was then placed but would not easily traverse into the vena cava.  A 5 French sheath was placed in the right femoral vein and imaging was performed.  This showed the right femoral and iliac veins to be patent with a sharp angle at the confluence of the iliac veins to the IVC which was then patent.  I then used a Kumpe catheter and an Amplatz wire to get into the vena cava. After skin nick and dilatation, the delivery sheath was placed into the inferior vena cava and an inferior venacavogram was performed. This demonstrated a patent IVC with the level of the renal veins at L2.  The filter was then deployed into the inferior vena cava at the level of L2 just below the renal veins. The delivery sheath was then removed. Pressure was held. Sterile dressings were placed. The patient tolerated the procedure well and was taken to the recovery room in stable condition.  COMPLICATIONS: None  CONDITION: Stable  Festus Barren  04/15/2023, 8:33 AM   This note was created with Dragon Medical transcription system. Any errors in dictation are purely unintentional.

## 2023-04-16 ENCOUNTER — Encounter: Payer: Self-pay | Admitting: Vascular Surgery

## 2023-04-17 ENCOUNTER — Encounter: Payer: Self-pay | Admitting: Vascular Surgery

## 2023-04-29 NOTE — Discharge Instructions (Addendum)
Instructions after Total Knee Replacement   James P. Angie Fava., M.D.    Dept. of Orthopaedics & Sports Medicine [facility] [address] [place], [place]  [address]  Phone: [phone]   Fax: [phone]       [email or web address]       DIET: Drink plenty of non-alcoholic fluids. Resume your normal diet. Include foods high in fiber.  ACTIVITY:  You may use crutches or a walker with weight-bearing as tolerated, unless instructed otherwise. You may be weaned off of the walker or crutches by your Physical Therapist.  Do NOT place pillows under the knee. Anything placed under the knee could limit your ability to straighten the knee.   Continue doing gentle exercises. Exercising will reduce the pain and swelling, increase motion, and prevent muscle weakness.   Please continue to use the TED compression stockings for 6 weeks. You may remove the stockings at night, but should reapply them in the morning. Do not drive or operate any equipment until instructed.  WOUND CARE:  Continue to use the PolarCare or ice packs periodically to reduce pain and swelling. You may bathe or shower after the staples are removed at the first office visit following surgery. The Aquacel bandage remains in place for 7 days postoperatively.  This bandage can be changed out by PT to a honeycomb dressing at this time or if earlier with saturation.  MEDICATIONS: You may resume your regular medications. Please take the pain medication as prescribed on the medication. Do not take pain medication on an empty stomach. You have been given a prescription for a blood thinner (Lovenox). Please take the medication as instructed. (NOTE: After completing a 2 week course of Lovenox, take two Enteric-coated aspirin once a day. This along with elevation will help reduce the possibility of phlebitis in your operated leg.) Do not drive or drink alcoholic beverages when taking pain medications.  CALL THE OFFICE  FOR: Temperature above 101 degrees Excessive bleeding or drainage on the dressing. Excessive swelling, coldness, or paleness of the toes. Persistent nausea and vomiting.  FOLLOW-UP:  You should have an appointment to return to the office in 10-14 days after surgery. Arrangements have been made for continuation of Physical Therapy (either home therapy or outpatient therapy).     [facility] Department Directory         [email or web address]       [email or web address]          Cardiology  Appointments: [place] - [phone]  Endocrinology  Appointments: [place] [phone] [place] - [phone]  Gastroenterology  Appointments: [place] [phone] [place] - [phone]        General Surgery   Appointments: [facility]  Internal Medicine/Family Medicine  Appointments: [facility] [place] - [phone] [place] - [phone]  Metabolic and Weigh Loss Surgery  Appointments: [facility]        Neurology  Appointments: [place] [phone] [place] - [phone]  Neurosurgery  Appointments: [place]  Obstetrics & Gynecology  Appointments: [place] [phone] [place] - [phone]        Pediatrics  Appointments: [place] [phone] [place] - [phone]  Physiatry  Appointments: [place] [phone]  Physical Therapy  Appointments: [place] - [phone]        Podiatry  Appointments: [place] [phone] [place] - [phone]  Pulmonology  Appointments: [place]  Rheumatology  Appointments: [place] [phone]        [place] Location: [facility]  [address] [place], [place]  [address]  [place] Location: [facility]. [address] [place], [place]  [address]  [place] Location: [facility] [address] [place],  [place]  [address]

## 2023-05-06 ENCOUNTER — Encounter: Payer: Self-pay | Admitting: Orthopedic Surgery

## 2023-05-06 ENCOUNTER — Other Ambulatory Visit: Payer: Self-pay

## 2023-05-06 ENCOUNTER — Encounter
Admission: RE | Admit: 2023-05-06 | Discharge: 2023-05-06 | Disposition: A | Discharge status: Home / Self Care | Payer: BC Managed Care – PPO | Source: Ambulatory Visit | Attending: Orthopedic Surgery | Admitting: Orthopedic Surgery

## 2023-05-06 VITALS — BP 128/91 | Temp 97.7°F | Ht 65.0 in | Wt 186.0 lb

## 2023-05-06 DIAGNOSIS — M1712 Unilateral primary osteoarthritis, left knee: Secondary | ICD-10-CM | POA: Diagnosis not present

## 2023-05-06 DIAGNOSIS — Z01818 Encounter for other preprocedural examination: Secondary | ICD-10-CM | POA: Insufficient documentation

## 2023-05-06 DIAGNOSIS — D509 Iron deficiency anemia, unspecified: Secondary | ICD-10-CM | POA: Diagnosis not present

## 2023-05-06 DIAGNOSIS — E119 Type 2 diabetes mellitus without complications: Secondary | ICD-10-CM

## 2023-05-06 DIAGNOSIS — Z01812 Encounter for preprocedural laboratory examination: Secondary | ICD-10-CM

## 2023-05-06 HISTORY — DX: Sleep apnea, unspecified: G47.30

## 2023-05-06 LAB — URINALYSIS, ROUTINE W REFLEX MICROSCOPIC
Bacteria, UA: NONE SEEN
Glucose, UA: NEGATIVE mg/dL
Hgb urine dipstick: NEGATIVE
Ketones, ur: 5 mg/dL — AB
Leukocytes,Ua: NEGATIVE
Nitrite: NEGATIVE
Protein, ur: 30 mg/dL — AB
Specific Gravity, Urine: 1.033 — ABNORMAL HIGH (ref 1.005–1.030)
pH: 5 (ref 5.0–8.0)

## 2023-05-06 LAB — SURGICAL PCR SCREEN
MRSA, PCR: NEGATIVE
Staphylococcus aureus: NEGATIVE

## 2023-05-06 LAB — COMPREHENSIVE METABOLIC PANEL
ALT: 12 U/L (ref 0–44)
AST: 20 U/L (ref 15–41)
Albumin: 4.1 g/dL (ref 3.5–5.0)
Alkaline Phosphatase: 87 U/L (ref 38–126)
Anion gap: 8 (ref 5–15)
BUN: 17 mg/dL (ref 8–23)
CO2: 25 mmol/L (ref 22–32)
Calcium: 9.1 mg/dL (ref 8.9–10.3)
Chloride: 107 mmol/L (ref 98–111)
Creatinine, Ser: 0.72 mg/dL (ref 0.44–1.00)
GFR, Estimated: >60 mL/min (ref >60)
Glucose, Bld: 84 mg/dL (ref 70–99)
Potassium: 3.8 mmol/L (ref 3.5–5.1)
Sodium: 140 mmol/L (ref 135–145)
Total Bilirubin: 0.7 mg/dL (ref 0.3–1.2)
Total Protein: 7.2 g/dL (ref 6.5–8.1)

## 2023-05-06 LAB — TYPE AND SCREEN
ABO/RH(D): A POS
Antibody Screen: NEGATIVE

## 2023-05-06 LAB — CBC
HCT: 35.7 % — ABNORMAL LOW (ref 36.0–46.0)
Hemoglobin: 11.3 g/dL — ABNORMAL LOW (ref 12.0–15.0)
MCH: 29.6 pg (ref 26.0–34.0)
MCHC: 31.7 g/dL (ref 30.0–36.0)
MCV: 93.5 fL (ref 80.0–100.0)
Platelets: 299 10*3/uL (ref 150–400)
RBC: 3.82 MIL/uL — ABNORMAL LOW (ref 3.87–5.11)
RDW: 14.5 % (ref 11.5–15.5)
WBC: 5.2 10*3/uL (ref 4.0–10.5)
nRBC: 0.4 % — ABNORMAL HIGH (ref 0.0–0.2)

## 2023-05-06 LAB — C-REACTIVE PROTEIN: CRP: <0.5 mg/dL (ref <1.0)

## 2023-05-06 LAB — SEDIMENTATION RATE: Sed Rate: 25 mm/hr (ref 0–30)

## 2023-05-06 MED ORDER — FAMOTIDINE 20 MG PO TABS
20.0000 mg | ORAL_TABLET | Freq: Once | ORAL | Status: DC
  Filled 2023-05-06: qty 1

## 2023-05-06 NOTE — Patient Instructions (Addendum)
Your procedure is scheduled on: 05/15/2023 Wednesday Report to the Registration Desk on the 1st floor of the [facility]. To find out your arrival time, please call [phone] between 1PM - 3PM on: 05/14/2023  If your arrival time is 6:00 am, do not arrive before that time as the [facility] entrance doors do not open until 6:00 am.  REMEMBER: Instructions that are not followed completely may result in serious medical risk, up to and including death; or upon the discretion of your surgeon and anesthesiologist your surgery may need to be rescheduled.  Do not eat food after midnight the night before surgery.  No gum chewing or hard candies.  You may however, drink CLEAR liquids up to 2 hours before you are scheduled to arrive for your surgery. Do not drink anything within 2 hours of your scheduled arrival time.  Clear liquids include: - water  - apple juice without pulp - gatorade (not RED colors) - black coffee or tea (Do NOT add milk or creamers to the coffee or tea) Do NOT drink anything that is not on this list.   In addition, your doctor has ordered for you to drink the provided:  Ensure Pre-Surgery Clear Carbohydrate Drink  Drinking this carbohydrate drink up to two hours before surgery helps to reduce insulin resistance and improve patient outcomes. Please complete drinking 2 hours before scheduled arrival time.  One week prior to surgery: Stop Anti-inflammatories (NSAIDS) such as Advil, Aleve, Ibuprofen, Motrin, Naproxen, Naprosyn and Aspirin based products such as Excedrin, Goody's Powder, BC Powder and meloxicam Stop ANY OVER THE COUNTER supplements until after surgery like ferrous sulfate, multivitamin, vit D,vitamin B-12, calcium You may however, continue to take Tylenol if needed for pain up until the day of surgery.  Continue taking all prescribed medications with the exception of the following:     Semaglutide - do not take it within 7 days prior to surgery  Follow  recommendations from Cardiologist or PCP regarding stopping blood thinners.  TAKE ONLY THESE MEDICATIONS THE MORNING OF SURGERY WITH A SIP OF WATER:  baclofen (LIORESAL)    Use inhalers on the day of surgery as prescribe and bring to the hospital.   No Alcohol for 24 hours before or after surgery.  No Smoking including e-cigarettes for 24 hours before surgery.  No chewable tobacco products for at least 6 hours before surgery.  No nicotine patches on the day of surgery.  Do not use any "recreational" drugs for at least a week (preferably 2 weeks) before your surgery.  Please be advised that the combination of cocaine and anesthesia may have negative outcomes, up to and including death. If you test positive for cocaine, your surgery will be cancelled.  On the morning of surgery brush your teeth with toothpaste and water, you may rinse your mouth with mouthwash if you wish. Do not swallow any toothpaste or mouthwash.  Use CHG Soap as directed on instruction sheet.  Do not wear jewelry, make-up, hairpins, clips or nail polish.  Do not wear lotions, powders, or perfumes.   Do not shave body hair from the neck down 48 hours before surgery.  Contact lenses, hearing aids and dentures may not be worn into surgery.  Do not bring valuables to the hospital. [facility] is not responsible for any missing/lost belongings or valuables.     Notify your doctor if there is any change in your medical condition (cold, fever, infection).  Wear comfortable clothing (specific to your surgery  type) to the hospital.  After surgery, you can help prevent lung complications by doing breathing exercises.  Take deep breaths and cough every 1-2 hours. Your doctor may order a device called an Incentive Spirometer to help you take deep breaths.  If you are being admitted to the hospital overnight, leave your suitcase in the car. After surgery it may be brought to your room.  In case of increased patient  census, it may be necessary for you, the patient, to continue your postoperative care in the Same Day Surgery department.  If you are being discharged the day of surgery, you will not be allowed to drive home. You will need a responsible individual to drive you home and stay with you for 24 hours after surgery.    Please call the Pre-admissions Testing Dept. at [phone] if you have any questions about these instructions.  Surgery Visitation Policy:  Patients having surgery or a procedure may have two visitors.  Children under the age of 91 must have an adult with them who is not the patient.  Inpatient Visitation:    Visiting hours are 7 a.m. to 8 p.m. Up to four visitors are allowed at one time in a patient room. The visitors may rotate out with other people during the day.  One visitor age 49 or older may stay with the patient overnight and must be in the room by 8 p.m.           Pre-operative 5 CHG Bath Instructions   You can play a key role in reducing the risk of infection after surgery. Your skin needs to be as free of germs as possible. You can reduce the number of germs on your skin by washing with CHG (chlorhexidine gluconate) soap before surgery. CHG is an antiseptic soap that kills germs and continues to kill germs even after washing.   DO NOT use if you have an allergy to chlorhexidine/CHG or antibacterial soaps. If your skin becomes reddened or irritated, stop using the CHG and notify one of our RNs at [phone].   Please shower with the CHG soap starting 4 days before surgery using the following schedule:     Please keep in mind the following:  DO NOT shave, including legs and underarms, starting the day of your first shower.   You may shave your face at any point before/day of surgery.  Place clean sheets on your bed the day you start using CHG soap. Use a clean washcloth (not used since being washed) for each shower. DO NOT sleep with pets once you  start using the CHG.   CHG Shower Instructions:  If you choose to wash your hair and private area, wash first with your normal shampoo/soap.  After you use shampoo/soap, rinse your hair and body thoroughly to remove shampoo/soap residue.  Turn the water OFF and apply about 3 tablespoons (45 ml) of CHG soap to a CLEAN washcloth.  Apply CHG soap ONLY FROM YOUR NECK DOWN TO YOUR TOES (washing for 3-5 minutes)  DO NOT use CHG soap on face, private areas, open wounds, or sores.  Pay special attention to the area where your surgery is being performed.  If you are having back surgery, having someone wash your back for you may be helpful. Wait 2 minutes after CHG soap is applied, then you may rinse off the CHG soap.  Pat dry with a clean towel  Put on clean clothes/pajamas   If you choose to wear lotion,  please use ONLY the CHG-compatible lotions on the back of this paper.     Additional instructions for the day of surgery: DO NOT APPLY any lotions, deodorants, cologne, or perfumes.   Put on clean/comfortable clothes.  Brush your teeth.  Ask your nurse before applying any prescription medications to the skin.      CHG Compatible Lotions   Aveeno Moisturizing lotion  Cetaphil Moisturizing Cream  Cetaphil Moisturizing Lotion  Clairol Herbal Essence Moisturizing Lotion, Dry Skin  Clairol Herbal Essence Moisturizing Lotion, Extra Dry Skin  Clairol Herbal Essence Moisturizing Lotion, Normal Skin  Curel Age Defying Therapeutic Moisturizing Lotion with Alpha Hydroxy  Curel Extreme Care Body Lotion  Curel Soothing Hands Moisturizing Hand Lotion  Curel Therapeutic Moisturizing Cream, Fragrance-Free  Curel Therapeutic Moisturizing Lotion, Fragrance-Free  Curel Therapeutic Moisturizing Lotion, Original Formula  Eucerin Daily Replenishing Lotion  Eucerin Dry Skin Therapy Plus Alpha Hydroxy Crme  Eucerin Dry Skin Therapy Plus Alpha Hydroxy Lotion  Eucerin Original Crme  Eucerin Original  Lotion  Eucerin Plus Crme Eucerin Plus Lotion  Eucerin TriLipid Replenishing Lotion  Keri Anti-Bacterial Hand Lotion  Keri Deep Conditioning Original Lotion Dry Skin Formula Softly Scented  Keri Deep Conditioning Original Lotion, Fragrance Free Sensitive Skin Formula  Keri Lotion Fast Absorbing Fragrance Free Sensitive Skin Formula  Keri Lotion Fast Absorbing Softly Scented Dry Skin Formula  Keri Original Lotion  Keri Skin Renewal Lotion Keri Silky Smooth Lotion  Keri Silky Smooth Sensitive Skin Lotion  Nivea Body Creamy Conditioning Oil  Nivea Body Extra Enriched Lotion  Nivea Body Original Lotion  Nivea Body Sheer Moisturizing Lotion Nivea Crme  Nivea Skin Firming Lotion  NutraDerm 30 Skin Lotion  NutraDerm Skin Lotion  NutraDerm Therapeutic Skin Cream  NutraDerm Therapeutic Skin Lotion  ProShield Protective Hand Cream  Provon moisturizing lotion           How to Use an Incentive Spirometer  An incentive spirometer is a tool that measures how well you are filling your lungs with each breath. Learning to take long, deep breaths using this tool can help you keep your lungs clear and active. This may help to reverse or lessen your chance of developing breathing (pulmonary) problems, especially infection. You may be asked to use a spirometer: After a surgery. If you have a lung problem or a history of smoking. After a long period of time when you have been unable to move or be active. If the spirometer includes an indicator to show the highest number that you have reached, your health care provider or respiratory therapist will help you set a goal. Keep a log of your progress as told by your health care provider. What are the risks? Breathing too quickly may cause dizziness or cause you to pass out. Take your time so you do not get dizzy or light-headed. If you are in pain, you may need to take pain medicine before doing incentive spirometry. It is harder to take a deep  breath if you are having pain. How to use your incentive spirometer  Sit up on the edge of your bed or on a chair. Hold the incentive spirometer so that it is in an upright position. Before you use the spirometer, breathe out normally. Place the mouthpiece in your mouth. Make sure your lips are closed tightly around it. Breathe in slowly and as deeply as you can through your mouth, causing the piston or the ball to rise toward the top of the chamber. Hold your breath for  3-5 seconds, or for as long as possible. If the spirometer includes a coach indicator, use this to guide you in breathing. Slow down your breathing if the indicator goes above the marked areas. Remove the mouthpiece from your mouth and breathe out normally. The piston or ball will return to the bottom of the chamber. Rest for a few seconds, then repeat the steps 10 or more times. Take your time and take a few normal breaths between deep breaths so that you do not get dizzy or light-headed. Do this every 1-2 hours when you are awake. If the spirometer includes a goal marker to show the highest number you have reached (best effort), use this as a goal to work toward during each repetition. After each set of 10 deep breaths, cough a few times. This will help to make sure that your lungs are clear. If you have an incision on your chest or abdomen from surgery, place a pillow or a rolled-up towel firmly against the incision when you cough. This can help to reduce pain while taking deep breaths and coughing. General tips When you are able to get out of bed: Walk around often. Continue to take deep breaths and cough in order to clear your lungs. Keep using the incentive spirometer until your health care provider says it is okay to stop using it. If you have been in the hospital, you may be told to keep using the spirometer at home. Contact a health care provider if: You are having difficulty using the spirometer. You have trouble  using the spirometer as often as instructed. Your pain medicine is not giving enough relief for you to use the spirometer as told. You have a fever. Get help right away if: You develop shortness of breath. You develop a cough with bloody mucus from the lungs. You have fluid or blood coming from an incision site after you cough. Summary An incentive spirometer is a tool that can help you learn to take long, deep breaths to keep your lungs clear and active. You may be asked to use a spirometer after a surgery, if you have a lung problem or a history of smoking, or if you have been inactive for a long period of time. Use your incentive spirometer as instructed every 1-2 hours while you are awake. If you have an incision on your chest or abdomen, place a pillow or a rolled-up towel firmly against your incision when you cough. This will help to reduce pain. Get help right away if you have shortness of breath, you cough up bloody mucus, or blood comes from your incision when you cough. This information is not intended to replace advice given to you by your health care provider. Make sure you discuss any questions you have with your health care provider. Document Revised: 02/08/2020 Document Reviewed: 02/08/2020 Elsevier Patient Education  2023 Elsevier Inc.    Preoperative Educational Videos for Total Hip, Knee and Shoulder Replacements  To better prepare for surgery, please view our videos that explain the physical activity and discharge planning required to have the best surgical recovery at [facility].  [email or web address]  Questions? Call [phone] or email [email or web address]

## 2023-05-07 LAB — HEMOGLOBIN A1C
Hgb A1c MFr Bld: 5.8 % — ABNORMAL HIGH (ref 4.8–5.6)
Mean Plasma Glucose: 120 mg/dL

## 2023-05-12 NOTE — H&P (Signed)
ORTHOPAEDIC HISTORY & PHYSICAL Erica Powers, [place] - 05/07/2023 3:15 PM EDT Formatting of this note is different from the original. NAME: Erica Powers H&P Date: 05/07/2023 Procedure Date: 05/15/2023  Chief Complaint: left knee pain  HPI Erica Powers is a 62 y.o. female who has severe left knee pain and has failed conservative treatment including Tylenol, topical and oral, NSAID's, injections, and activity modification. She reports that her pain has not improved, describes it as throbbing and aching worse with activity, using stairs or rising from a chair. She has requested operative intervention for relief of her DJD symptoms. Of note patient is rheumatoid factor positive being followed by rheumatology. She also has a significant history with DVT and pulmonary embolism formation. She recently had a IVC filter placed by Dr. Wyn Powers on 04/15/2023 per the request of Dr. Ernest Powers. Patient was cleared by cardiology with IVC filter placement for surgery.  Social Hx: Patient is a full-time Lawyer at [employer]. Patient does not smoke or use illicit drugs.  Medications & Allergies Allergies: Allergies Allergen Reactions Codeine Itching and Other (See Comments) Hydrocodone Itching  Home Medicines: Current Outpatient Medications on File Prior to Visit Medication Sig Dispense Refill acetaminophen (TYLENOL) 500 MG tablet Take 1,000 mg by mouth every 6 (six) hours as needed albuterol 90 mcg/actuation inhaler Inhale 2 inhalations into the lungs every 6 (six) hours as needed baclofen (LIORESAL) 10 MG tablet Take 10 mg by mouth 3 (three) times daily cetirizine (ZYRTEC) 10 MG tablet Take 10 mg by mouth once daily cyclobenzaprine (FLEXERIL) 5 MG tablet Take 5 mg by mouth 3 (three) times daily as needed for Muscle spasms fluticasone propionate (FLONASE) 50 mcg/actuation nasal spray Place 1 spray into one nostril once daily ipratropium (ATROVENT) 0.06 % nasal spray Place 2 sprays into both nostrils 2  (two) times daily as needed losartan (COZAAR) 25 MG tablet Take 25 mg by mouth once daily meloxicam (MOBIC) 15 MG tablet Take 15 mg by mouth once daily menthol (BIOFREEZE, MENTHOL,) 4 % Gel Apply topically once as needed montelukast (SINGULAIR) 10 mg tablet Take 10 mg by mouth at bedtime multivitamin tablet Take 1 tablet by mouth once daily PATADAY ONCE DAILY RELIEF 0.7 % ophthalmic solution Place 1 drop into both eyes once daily as needed SYMBICORT 160-4.5 mcg/actuation inhaler WEGOVY 2.4 mg/0.75 mL pen injector calcium carbonate-vit D3-min 600 mg calcium- 400 unit Tab Take 1 tablet by mouth 2 (two) times daily (Patient not taking: Reported on 05/07/2023) diclofenac (VOLTAREN) 1 % topical gel Apply 2 g topically 4 (four) times daily as needed (Patient not taking: Reported on 05/07/2023) zafirlukast (ACCOLATE) 20 MG tablet Take 1 tablet (20 mg total) by mouth 2 (two) times daily (Patient not taking: Reported on 04/18/2023) 60 tablet 2  No current facility-administered medications on file prior to visit.  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 Ultrasound guidance for vascular access to  the right femoral vein 2. Catheter placement into the inferior vena cava 3. Inferior venacavogram and right iliofemoral venogram 4. Placement of a option Elite IVC filter 04/15/2023 Dr.[name] BONE SPUR Bilateral HEELS BREAST SURGERY CESAREAN SECTION   Physical Exam  Ht:165.1 cm (5\' 5" ) Wt:84.4 kg (186 lb) BMI: Body mass index is 30.95 kg/m.  General/Constitutional: No apparent distress: well-nourished and well developed. Eyes: Pupils equal, round with synchronous movement. Lymphatic: No palpable adenopathy. Respiratory: Patient has good chest wall movement with inspiration and expiration. Upon auscultation, patient does not have any wheezes, rhonchi or rails appreciated bilaterally. No crackles appreciated at the bases of the lungs Cardiovascular: Patient has a regular rate and rhythm without any appreciable murmurs, heaves, rubs or gallops. Distal posterior tibial pulses appreciated (2+). No swelling in the lower legs appreciated. Homan's sign negative Integumentary: No impressive skin lesions, deformities or abrasions are noted on physical exam Neuro/Psych: Normal mood and affect, oriented to person, place and time. Musculoskeletal: see left knee exam below  Left knee exam Inspection of the patient's left knee shows a moderate valgus deformity. Patient is able to flex and extend the knee well with good strength in flexion and extension (5/5). ROM to about 125 degrees in flexion and has about 5 degrees off full extension. Patient denies having much pain with palpation around the knee during today's visit. Knee is stable with varus and valgus stress testing. Anterior posterior drawer testing are negative. Patient is neurovascularly intact in all lower leg dermatomes. Posterior tibial pulses appreciated (2+).  Imaging left Knee Imaging: No new images today. Previous images of the patient's left knee were taken on 02/19/2023. These films were  reviewed and show severe tricompartmental degenerative changes with complete invasion of the lateral femoral condyle into the lateral tibial plateau with medial displacement. There is significant osteophyte formation around the femoral and tibial compartments, there is also presence of calcifications appearing to be along the suprapatellar recess. No fractures, lytic lesions or gross deformities appreciated  Assesment and Plan Knee DJD  I have recommended that Erica Powers undergo left total knee replacement. Consents has been signed. The risks, benefits, prognosis and alternatives including but not limited to DVT, PE, infection, neurovascular injury, failure of the procedure and death were explained to the patient and she is willing to proceed with surgery as described to her by myself. Plan will be for post operative admission of at least 1 midnight for pain control and PT. She will be managed with DVT prophylaxis, antibiotics preoperatively for 24 hours and aggressive in patient rehab.  Pre, intra and post op interventions were discussed. Patient has good understanding  Medication Reconciliation was performed. Discussed cessation of losartan, as needed allergy medications, NSAIDs, Wegovy, vitamins and supplements.  Patient has a significant history with DVT risk. Has had IVC filter placed on 04/15/2023, cleared by cardiology to proceed with surgical intervention.  A total of 50 minutes was spent reviewing patient's charts, medical reconciliation, discussing/educating the patient about surgical interventions, and answering any questions provided by the patient.  JOSHUA Kendrick Fries, PA [facility] 05/07/2023  Electronically signed by Erica Rover, PA at 05/07/2023 4:23 PM EDT

## 2023-05-14 ENCOUNTER — Encounter: Payer: Self-pay | Admitting: Orthopedic Surgery

## 2023-05-15 ENCOUNTER — Other Ambulatory Visit: Payer: Self-pay

## 2023-05-15 ENCOUNTER — Observation Stay
Admission: RE | Admit: 2023-05-15 | Discharge: 2023-05-16 | Disposition: A | Discharge status: Home / Self Care | Payer: BC Managed Care – PPO | Attending: Orthopedic Surgery | Admitting: Orthopedic Surgery

## 2023-05-15 ENCOUNTER — Ambulatory Visit: Payer: BC Managed Care – PPO

## 2023-05-15 ENCOUNTER — Ambulatory Visit: Payer: BC Managed Care – PPO | Admitting: Urgent Care

## 2023-05-15 ENCOUNTER — Observation Stay: Payer: BC Managed Care – PPO

## 2023-05-15 ENCOUNTER — Encounter
Admission: RE | Disposition: A | Discharge status: Home / Self Care | Payer: Self-pay | Source: Home / Self Care | Attending: Orthopedic Surgery

## 2023-05-15 ENCOUNTER — Encounter: Payer: Self-pay | Admitting: Orthopedic Surgery

## 2023-05-15 DIAGNOSIS — D509 Iron deficiency anemia, unspecified: Secondary | ICD-10-CM

## 2023-05-15 DIAGNOSIS — J45909 Unspecified asthma, uncomplicated: Secondary | ICD-10-CM | POA: Diagnosis not present

## 2023-05-15 DIAGNOSIS — M1712 Unilateral primary osteoarthritis, left knee: Principal | ICD-10-CM | POA: Insufficient documentation

## 2023-05-15 DIAGNOSIS — Z96659 Presence of unspecified artificial knee joint: Secondary | ICD-10-CM

## 2023-05-15 DIAGNOSIS — Z86718 Personal history of other venous thrombosis and embolism: Secondary | ICD-10-CM | POA: Diagnosis not present

## 2023-05-15 DIAGNOSIS — Z96652 Presence of left artificial knee joint: Secondary | ICD-10-CM

## 2023-05-15 DIAGNOSIS — E119 Type 2 diabetes mellitus without complications: Secondary | ICD-10-CM | POA: Diagnosis not present

## 2023-05-15 DIAGNOSIS — Z79899 Other long term (current) drug therapy: Secondary | ICD-10-CM | POA: Insufficient documentation

## 2023-05-15 DIAGNOSIS — Z96651 Presence of right artificial knee joint: Secondary | ICD-10-CM | POA: Diagnosis not present

## 2023-05-15 DIAGNOSIS — I10 Essential (primary) hypertension: Secondary | ICD-10-CM | POA: Diagnosis not present

## 2023-05-15 DIAGNOSIS — Z86711 Personal history of pulmonary embolism: Secondary | ICD-10-CM | POA: Insufficient documentation

## 2023-05-15 HISTORY — DX: Spondylosis without myelopathy or radiculopathy, lumbar region: M47.816

## 2023-05-15 HISTORY — DX: Other specified abnormal immunological findings in serum: R76.8

## 2023-05-15 HISTORY — DX: Anemia, unspecified: D64.9

## 2023-05-15 HISTORY — PX: KNEE ARTHROPLASTY: SHX992

## 2023-05-15 HISTORY — DX: Low back pain, unspecified: M54.50

## 2023-05-15 HISTORY — DX: Type 2 diabetes mellitus without complications: E11.9

## 2023-05-15 HISTORY — DX: Other specified abnormal immunological findings in serum: R76.89

## 2023-05-15 LAB — GLUCOSE, CAPILLARY
Glucose-Capillary: 108 mg/dL — ABNORMAL HIGH (ref 70–99)
Glucose-Capillary: 225 mg/dL — ABNORMAL HIGH (ref 70–99)

## 2023-05-15 SURGERY — ARTHROPLASTY, KNEE, TOTAL, USING IMAGELESS COMPUTER-ASSISTED NAVIGATION
Anesthesia: Spinal | Site: Knee | Laterality: Left

## 2023-05-15 MED ORDER — ONDANSETRON HCL 4 MG/2ML IJ SOLN: 4.0000 mg | Freq: Four times a day (QID) | INTRAMUSCULAR | Status: DC | PRN

## 2023-05-15 MED ORDER — INSULIN ASPART 100 UNIT/ML IJ SOLN
INTRAMUSCULAR | Status: AC
  Filled 2023-05-15: qty 1

## 2023-05-15 MED ORDER — LOSARTAN POTASSIUM 50 MG PO TABS
25.0000 mg | ORAL_TABLET | Freq: Every day | ORAL | Status: DC
  Administered 2023-05-16: 25 mg via ORAL

## 2023-05-15 MED ORDER — ALBUTEROL SULFATE (2.5 MG/3ML) 0.083% IN NEBU: 3.0000 mL | INHALATION_SOLUTION | RESPIRATORY_TRACT | Status: DC | PRN

## 2023-05-15 MED ORDER — PHENOL 1.4 % MT LIQD: 1.0000 | OROMUCOSAL | Status: DC | PRN

## 2023-05-15 MED ORDER — OXYCODONE HCL 5 MG PO TABS
ORAL_TABLET | ORAL | Status: AC
  Filled 2023-05-15: qty 2

## 2023-05-15 MED ORDER — ONDANSETRON HCL 4 MG/2ML IJ SOLN
INTRAMUSCULAR | Status: DC | PRN
  Administered 2023-05-15: 4 mg via INTRAVENOUS

## 2023-05-15 MED ORDER — SODIUM CHLORIDE 0.9 % IV SOLN: INTRAVENOUS | Status: DC | PRN

## 2023-05-15 MED ORDER — LACTATED RINGERS IV SOLN: INTRAVENOUS | Status: DC | PRN

## 2023-05-15 MED ORDER — CEFAZOLIN SODIUM-DEXTROSE 2-4 GM/100ML-% IV SOLN
2.0000 g | Freq: Four times a day (QID) | INTRAVENOUS | Status: AC
  Administered 2023-05-15 – 2023-05-16 (×2): 2 g via INTRAVENOUS

## 2023-05-15 MED ORDER — MENTHOL 3 MG MT LOZG: 1.0000 | LOZENGE | OROMUCOSAL | Status: DC | PRN

## 2023-05-15 MED ORDER — INSULIN ASPART 100 UNIT/ML IJ SOLN: 0.0000 [IU] | Freq: Three times a day (TID) | INTRAMUSCULAR | Status: DC

## 2023-05-15 MED ORDER — SENNOSIDES-DOCUSATE SODIUM 8.6-50 MG PO TABS
ORAL_TABLET | ORAL | Status: AC
  Filled 2023-05-15: qty 1

## 2023-05-15 MED ORDER — MIDAZOLAM HCL 5 MG/5ML IJ SOLN
INTRAMUSCULAR | Status: DC | PRN
  Administered 2023-05-15 (×2): 1 mg via INTRAVENOUS
  Administered 2023-05-15: 2 mg via INTRAVENOUS

## 2023-05-15 MED ORDER — OXYCODONE HCL 5 MG PO TABS
ORAL_TABLET | ORAL | Status: AC
  Filled 2023-05-15: qty 1

## 2023-05-15 MED ORDER — PANTOPRAZOLE SODIUM 40 MG PO TBEC
DELAYED_RELEASE_TABLET | ORAL | Status: AC
  Filled 2023-05-15: qty 1

## 2023-05-15 MED ORDER — CELECOXIB 200 MG PO CAPS
ORAL_CAPSULE | ORAL | Status: AC
  Filled 2023-05-15: qty 2

## 2023-05-15 MED ORDER — MIDAZOLAM HCL 2 MG/2ML IJ SOLN
INTRAMUSCULAR | Status: AC
  Filled 2023-05-15: qty 2

## 2023-05-15 MED ORDER — TRANEXAMIC ACID-NACL 1000-0.7 MG/100ML-% IV SOLN
INTRAVENOUS | Status: AC
  Filled 2023-05-15: qty 100

## 2023-05-15 MED ORDER — CELECOXIB 200 MG PO CAPS
200.0000 mg | ORAL_CAPSULE | Freq: Two times a day (BID) | ORAL | Status: DC
  Administered 2023-05-15 – 2023-05-16 (×2): 200 mg via ORAL

## 2023-05-15 MED ORDER — OXYCODONE HCL 5 MG PO TABS
10.0000 mg | ORAL_TABLET | ORAL | Status: DC | PRN
  Administered 2023-05-15 – 2023-05-16 (×3): 10 mg via ORAL

## 2023-05-15 MED ORDER — FERROUS SULFATE 325 (65 FE) MG PO TABS
325.0000 mg | ORAL_TABLET | Freq: Two times a day (BID) | ORAL | Status: DC
  Administered 2023-05-15 – 2023-05-16 (×2): 325 mg via ORAL

## 2023-05-15 MED ORDER — FENTANYL CITRATE (PF) 100 MCG/2ML IJ SOLN
INTRAMUSCULAR | Status: AC
  Filled 2023-05-15: qty 2

## 2023-05-15 MED ORDER — DEXAMETHASONE SODIUM PHOSPHATE 10 MG/ML IJ SOLN
INTRAMUSCULAR | Status: AC
  Filled 2023-05-15: qty 1

## 2023-05-15 MED ORDER — CHLORHEXIDINE GLUCONATE 0.12 % MT SOLN
OROMUCOSAL | Status: AC
  Filled 2023-05-15: qty 15

## 2023-05-15 MED ORDER — ACETAMINOPHEN 325 MG PO TABS: 325.0000 mg | ORAL_TABLET | Freq: Four times a day (QID) | ORAL | Status: DC | PRN

## 2023-05-15 MED ORDER — BACLOFEN 10 MG PO TABS
10.0000 mg | ORAL_TABLET | Freq: Three times a day (TID) | ORAL | Status: DC
  Administered 2023-05-15 – 2023-05-16 (×2): 10 mg via ORAL
  Filled 2023-05-15 (×3): qty 1

## 2023-05-15 MED ORDER — OXYCODONE HCL 5 MG PO TABS
5.0000 mg | ORAL_TABLET | ORAL | Status: DC | PRN
  Administered 2023-05-15: 5 mg via ORAL

## 2023-05-15 MED ORDER — TRANEXAMIC ACID-NACL 1000-0.7 MG/100ML-% IV SOLN
1000.0000 mg | INTRAVENOUS | Status: AC
  Administered 2023-05-15: 1000 mg via INTRAVENOUS

## 2023-05-15 MED ORDER — CYCLOBENZAPRINE HCL 5 MG PO TABS: 5.0000 mg | ORAL_TABLET | Freq: Three times a day (TID) | ORAL | Status: DC | PRN

## 2023-05-15 MED ORDER — CEFAZOLIN SODIUM-DEXTROSE 2-4 GM/100ML-% IV SOLN
INTRAVENOUS | Status: AC
  Filled 2023-05-15: qty 100

## 2023-05-15 MED ORDER — MAGNESIUM HYDROXIDE 400 MG/5ML PO SUSP
30.0000 mL | Freq: Every day | ORAL | Status: DC
  Administered 2023-05-16: 30 mL via ORAL

## 2023-05-15 MED ORDER — PROPOFOL 1000 MG/100ML IV EMUL
INTRAVENOUS | Status: AC
  Filled 2023-05-15: qty 100

## 2023-05-15 MED ORDER — SODIUM CHLORIDE 0.9 % IR SOLN
Status: DC | PRN
  Administered 2023-05-15: 3000 mL

## 2023-05-15 MED ORDER — FLEET ENEMA 7-19 GM/118ML RE ENEM: 1.0000 | ENEMA | Freq: Once | RECTAL | Status: DC | PRN

## 2023-05-15 MED ORDER — PROPOFOL 10 MG/ML IV BOLUS
INTRAVENOUS | Status: AC
  Filled 2023-05-15: qty 20

## 2023-05-15 MED ORDER — TRAMADOL HCL 50 MG PO TABS
ORAL_TABLET | ORAL | Status: AC
  Filled 2023-05-15: qty 1

## 2023-05-15 MED ORDER — INSULIN ASPART 100 UNIT/ML IJ SOLN
0.0000 [IU] | Freq: Every day | INTRAMUSCULAR | Status: DC
  Administered 2023-05-15: 2 [IU] via SUBCUTANEOUS

## 2023-05-15 MED ORDER — GABAPENTIN 300 MG PO CAPS
ORAL_CAPSULE | ORAL | Status: AC
  Filled 2023-05-15: qty 1

## 2023-05-15 MED ORDER — MONTELUKAST SODIUM 10 MG PO TABS
10.0000 mg | ORAL_TABLET | Freq: Every day | ORAL | Status: DC
  Administered 2023-05-15: 10 mg via ORAL
  Filled 2023-05-15: qty 1

## 2023-05-15 MED ORDER — TRAMADOL HCL 50 MG PO TABS
50.0000 mg | ORAL_TABLET | ORAL | Status: DC | PRN
  Administered 2023-05-15 – 2023-05-16 (×2): 50 mg via ORAL

## 2023-05-15 MED ORDER — OLOPATADINE HCL 0.1 % OP SOLN: 1.0000 [drp] | OPHTHALMIC | Status: DC | PRN

## 2023-05-15 MED ORDER — MOMETASONE FURO-FORMOTEROL FUM 100-5 MCG/ACT IN AERO
2.0000 | INHALATION_SPRAY | Freq: Two times a day (BID) | RESPIRATORY_TRACT | Status: DC
  Administered 2023-05-15 – 2023-05-16 (×2): 2 via RESPIRATORY_TRACT
  Filled 2023-05-15: qty 8.8

## 2023-05-15 MED ORDER — BISACODYL 10 MG RE SUPP: 10.0000 mg | Freq: Every day | RECTAL | Status: DC | PRN

## 2023-05-15 MED ORDER — PROPOFOL 500 MG/50ML IV EMUL
INTRAVENOUS | Status: DC | PRN
  Administered 2023-05-15 (×2): 10 mg via INTRAVENOUS
  Administered 2023-05-15: 50 ug/kg/min via INTRAVENOUS
  Administered 2023-05-15: 75 ug/kg/min via INTRAVENOUS

## 2023-05-15 MED ORDER — ACETAMINOPHEN 10 MG/ML IV SOLN
INTRAVENOUS | Status: AC
  Filled 2023-05-15: qty 100

## 2023-05-15 MED ORDER — ONDANSETRON HCL 4 MG PO TABS: 4.0000 mg | ORAL_TABLET | Freq: Four times a day (QID) | ORAL | Status: DC | PRN

## 2023-05-15 MED ORDER — CELECOXIB 200 MG PO CAPS
ORAL_CAPSULE | ORAL | Status: AC
  Filled 2023-05-15: qty 1

## 2023-05-15 MED ORDER — CELECOXIB 200 MG PO CAPS
400.0000 mg | ORAL_CAPSULE | Freq: Once | ORAL | Status: AC
  Administered 2023-05-15: 400 mg via ORAL

## 2023-05-15 MED ORDER — FENTANYL CITRATE (PF) 100 MCG/2ML IJ SOLN
INTRAMUSCULAR | Status: DC | PRN
  Administered 2023-05-15: 50 ug via INTRAVENOUS
  Administered 2023-05-15 (×2): 25 ug via INTRAVENOUS

## 2023-05-15 MED ORDER — SENNOSIDES-DOCUSATE SODIUM 8.6-50 MG PO TABS
1.0000 | ORAL_TABLET | Freq: Two times a day (BID) | ORAL | Status: DC
  Administered 2023-05-15 – 2023-05-16 (×2): 1 via ORAL

## 2023-05-15 MED ORDER — BUPIVACAINE HCL (PF) 0.25 % IJ SOLN
INTRAMUSCULAR | Status: AC
  Filled 2023-05-15: qty 60

## 2023-05-15 MED ORDER — PROPOFOL 10 MG/ML IV BOLUS: INTRAVENOUS | Status: DC | PRN

## 2023-05-15 MED ORDER — AZELASTINE HCL 0.1 % NA SOLN: 1.0000 | Freq: Two times a day (BID) | NASAL | Status: DC | PRN

## 2023-05-15 MED ORDER — SURGIPHOR WOUND IRRIGATION SYSTEM - OPTIME: TOPICAL | Status: DC | PRN

## 2023-05-15 MED ORDER — OXYCODONE HCL 5 MG/5ML PO SOLN: 5.0000 mg | Freq: Once | ORAL | Status: AC | PRN

## 2023-05-15 MED ORDER — ACETAMINOPHEN 10 MG/ML IV SOLN
INTRAVENOUS | Status: DC | PRN
  Administered 2023-05-15: 1000 mg via INTRAVENOUS

## 2023-05-15 MED ORDER — FAMOTIDINE 20 MG PO TABS
ORAL_TABLET | ORAL | Status: AC
  Filled 2023-05-15: qty 1

## 2023-05-15 MED ORDER — FAMOTIDINE 20 MG PO TABS
20.0000 mg | ORAL_TABLET | Freq: Once | ORAL | Status: AC
  Administered 2023-05-15: 20 mg via ORAL

## 2023-05-15 MED ORDER — FENTANYL CITRATE (PF) 100 MCG/2ML IJ SOLN: 25.0000 ug | INTRAMUSCULAR | Status: DC | PRN

## 2023-05-15 MED ORDER — TRANEXAMIC ACID-NACL 1000-0.7 MG/100ML-% IV SOLN
1000.0000 mg | Freq: Once | INTRAVENOUS | Status: AC
  Administered 2023-05-15: 1000 mg via INTRAVENOUS

## 2023-05-15 MED ORDER — FERROUS SULFATE 325 (65 FE) MG PO TABS
ORAL_TABLET | ORAL | Status: AC
  Filled 2023-05-15: qty 1

## 2023-05-15 MED ORDER — CHLORHEXIDINE GLUCONATE 4 % EX SOLN: 60.0000 mL | Freq: Once | CUTANEOUS | Status: DC

## 2023-05-15 MED ORDER — ALUM & MAG HYDROXIDE-SIMETH 200-200-20 MG/5ML PO SUSP: 30.0000 mL | ORAL | Status: DC | PRN

## 2023-05-15 MED ORDER — PHENYLEPHRINE HCL-NACL 20-0.9 MG/250ML-% IV SOLN
INTRAVENOUS | Status: DC | PRN
  Administered 2023-05-15: 20 ug/min via INTRAVENOUS

## 2023-05-15 MED ORDER — FLUTICASONE PROPIONATE 50 MCG/ACT NA SUSP
1.0000 | Freq: Every day | NASAL | Status: DC
  Filled 2023-05-15: qty 16

## 2023-05-15 MED ORDER — BUPIVACAINE LIPOSOME 1.3 % IJ SUSP
INTRAMUSCULAR | Status: AC
  Filled 2023-05-15: qty 20

## 2023-05-15 MED ORDER — CEFAZOLIN SODIUM-DEXTROSE 2-4 GM/100ML-% IV SOLN
2.0000 g | INTRAVENOUS | Status: AC
  Administered 2023-05-15: 2 g via INTRAVENOUS

## 2023-05-15 MED ORDER — DIPHENHYDRAMINE HCL 12.5 MG/5ML PO ELIX: 12.5000 mg | ORAL_SOLUTION | ORAL | Status: DC | PRN

## 2023-05-15 MED ORDER — SODIUM CHLORIDE FLUSH 0.9 % IV SOLN
INTRAVENOUS | Status: AC
  Filled 2023-05-15: qty 40

## 2023-05-15 MED ORDER — BUPIVACAINE HCL (PF) 0.5 % IJ SOLN
INTRAMUSCULAR | Status: DC | PRN
  Administered 2023-05-15: 3 mL

## 2023-05-15 MED ORDER — ACETAMINOPHEN 10 MG/ML IV SOLN
1000.0000 mg | Freq: Four times a day (QID) | INTRAVENOUS | Status: AC
  Administered 2023-05-15 – 2023-05-16 (×3): 1000 mg via INTRAVENOUS
  Filled 2023-05-15: qty 100

## 2023-05-15 MED ORDER — SODIUM CHLORIDE 0.9 % IV SOLN: INTRAVENOUS | Status: DC

## 2023-05-15 MED ORDER — DEXAMETHASONE SODIUM PHOSPHATE 10 MG/ML IJ SOLN
8.0000 mg | Freq: Once | INTRAMUSCULAR | Status: AC
  Administered 2023-05-15: 8 mg via INTRAVENOUS

## 2023-05-15 MED ORDER — ENOXAPARIN SODIUM 30 MG/0.3ML IJ SOSY
30.0000 mg | PREFILLED_SYRINGE | Freq: Two times a day (BID) | INTRAMUSCULAR | Status: DC
  Administered 2023-05-16: 30 mg via SUBCUTANEOUS

## 2023-05-15 MED ORDER — OXYCODONE HCL 5 MG PO TABS
5.0000 mg | ORAL_TABLET | Freq: Once | ORAL | Status: AC | PRN
  Administered 2023-05-15: 5 mg via ORAL

## 2023-05-15 MED ORDER — PHENYLEPHRINE HCL-NACL 20-0.9 MG/250ML-% IV SOLN
INTRAVENOUS | Status: AC
  Filled 2023-05-15: qty 250

## 2023-05-15 MED ORDER — SODIUM CHLORIDE (PF) 0.9 % IJ SOLN
INTRAMUSCULAR | Status: DC | PRN
  Administered 2023-05-15: 120 mL via INTRAMUSCULAR

## 2023-05-15 MED ORDER — PANTOPRAZOLE SODIUM 40 MG PO TBEC
40.0000 mg | DELAYED_RELEASE_TABLET | Freq: Two times a day (BID) | ORAL | Status: DC
  Administered 2023-05-15 – 2023-05-16 (×2): 40 mg via ORAL

## 2023-05-15 MED ORDER — ONDANSETRON HCL 4 MG/2ML IJ SOLN
INTRAMUSCULAR | Status: AC
  Filled 2023-05-15: qty 2

## 2023-05-15 MED ORDER — GABAPENTIN 300 MG PO CAPS
300.0000 mg | ORAL_CAPSULE | Freq: Once | ORAL | Status: AC
  Administered 2023-05-15: 300 mg via ORAL

## 2023-05-15 MED ORDER — HYDROMORPHONE HCL 1 MG/ML IJ SOLN: 0.5000 mg | INTRAMUSCULAR | Status: DC | PRN

## 2023-05-15 SURGICAL SUPPLY — 78 items
ATTUNE MED DOME PAT 41 KNEE (Knees) IMPLANT
ATTUNE PS FEM LT SZ 8 CEM KNEE (Femur) IMPLANT
ATTUNE PSRP INSR SZ8 5 KNEE (Insert) IMPLANT
BASE TIBIAL ROT PLAT SZ 7 KNEE (Knees) IMPLANT
BATTERY INSTRU NAVIGATION (MISCELLANEOUS) ×4 IMPLANT
BLADE CLIPPER SURG (BLADE) IMPLANT
BLADE SAW 70X12.5 (BLADE) ×1 IMPLANT
BLADE SAW 90X13X1.19 OSCILLAT (BLADE) ×1 IMPLANT
BLADE SAW 90X25X1.19 OSCILLAT (BLADE) ×1 IMPLANT
BONE CEMENT GENTAMICIN (Cement) ×2 IMPLANT
BRUSH SCRUB EZ PLAIN DRY (MISCELLANEOUS) ×1 IMPLANT
BSPLAT TIB 7 CMNT ROT PLAT STR (Knees) ×1 IMPLANT
BTRY SRG DRVR LF (MISCELLANEOUS) ×4
CATH FOLEY SIL 2WAY 12FR5CC (CATHETERS) IMPLANT
CEMENT BONE GENTAMICIN 40 (Cement) IMPLANT
COOLER POLAR GLACIER W/PUMP (MISCELLANEOUS) ×1 IMPLANT
CUFF TOURN SGL QUICK 24 (TOURNIQUET CUFF)
CUFF TOURN SGL QUICK 34 (TOURNIQUET CUFF)
CUFF TRNQT CYL 24X4X16.5-23 (TOURNIQUET CUFF) IMPLANT
CUFF TRNQT CYL 34X4.125X (TOURNIQUET CUFF) IMPLANT
DRAPE 3/4 80X56 (DRAPES) ×1 IMPLANT
DRAPE INCISE IOBAN 66X45 STRL (DRAPES) IMPLANT
DRSG AQUACEL AG ADV 3.5X14 (GAUZE/BANDAGES/DRESSINGS) ×1 IMPLANT
DRSG DERMACEA NONADH 3X8 (GAUZE/BANDAGES/DRESSINGS) ×1 IMPLANT
DRSG MEPILEX SACRM 8.7X9.8 (GAUZE/BANDAGES/DRESSINGS) ×1 IMPLANT
DRSG TEGADERM 4X4.75 (GAUZE/BANDAGES/DRESSINGS) ×1 IMPLANT
DURAPREP 26ML APPLICATOR (WOUND CARE) ×2 IMPLANT
ELECT CAUTERY BLADE 6.4 (BLADE) ×1 IMPLANT
ELECT REM PT RETURN 9FT ADLT (ELECTROSURGICAL) ×1
ELECTRODE REM PT RTRN 9FT ADLT (ELECTROSURGICAL) ×1 IMPLANT
EX-PIN ORTHOLOCK NAV 4X150 (PIN) ×2 IMPLANT
GLOVE BIOGEL M STRL SZ7.5 (GLOVE) ×4 IMPLANT
GLOVE SRG 8 PF TXTR STRL LF DI (GLOVE) ×2 IMPLANT
GLOVE SURG UNDER POLY LF SZ8 (GLOVE) ×2
GOWN STRL REUS W/ TWL LRG LVL3 (GOWN DISPOSABLE) ×1 IMPLANT
GOWN STRL REUS W/ TWL XL LVL3 (GOWN DISPOSABLE) ×1 IMPLANT
GOWN STRL REUS W/TWL LRG LVL3 (GOWN DISPOSABLE) ×1
GOWN STRL REUS W/TWL XL LVL3 (GOWN DISPOSABLE) ×1
GOWN TOGA ZIPPER T7+ PEEL AWAY (MISCELLANEOUS) ×1 IMPLANT
HANDLE YANKAUER SUCT OPEN TIP (MISCELLANEOUS) ×1 IMPLANT
HEMOVAC 400CC 10FR (MISCELLANEOUS) ×1 IMPLANT
HOLDER FOLEY CATH W/STRAP (MISCELLANEOUS) ×1 IMPLANT
HOOD PEEL AWAY T7 (MISCELLANEOUS) ×1 IMPLANT
IV NS IRRIG 3000ML ARTHROMATIC (IV SOLUTION) ×1 IMPLANT
KIT TURNOVER KIT A (KITS) ×1 IMPLANT
KNIFE SCULPS 14X20 (INSTRUMENTS) ×1 IMPLANT
MANIFOLD NEPTUNE II (INSTRUMENTS) ×2 IMPLANT
NDL SPNL 20GX3.5 QUINCKE YW (NEEDLE) ×2 IMPLANT
NEEDLE SPNL 20GX3.5 QUINCKE YW (NEEDLE) ×2 IMPLANT
PACK TOTAL KNEE (MISCELLANEOUS) ×1 IMPLANT
PAD ABD DERMACEA PRESS 5X9 (GAUZE/BANDAGES/DRESSINGS) ×2 IMPLANT
PAD ARMBOARD 7.5X6 YLW CONV (MISCELLANEOUS) ×3 IMPLANT
PAD WRAPON POLAR KNEE (MISCELLANEOUS) ×1 IMPLANT
PIN DRILL FIX HALF THREAD (BIT) ×2 IMPLANT
PIN DRILL QUICK PACK (PIN) ×2 IMPLANT
PIN FIXATION 1/8DIA X 3INL (PIN) ×1 IMPLANT
PULSAVAC PLUS IRRIG FAN TIP (DISPOSABLE) ×1
SOL PREP PVP 2OZ (MISCELLANEOUS) ×1
SOLUTION IRRIG SURGIPHOR (IV SOLUTION) ×1 IMPLANT
SOLUTION PREP PVP 2OZ (MISCELLANEOUS) ×1 IMPLANT
SPONGE DRAIN TRACH 4X4 STRL 2S (GAUZE/BANDAGES/DRESSINGS) ×1 IMPLANT
SPONGE T-LAP 18X18 ~~LOC~~+RFID (SPONGE) IMPLANT
STAPLER SKIN PROX 35W (STAPLE) ×1 IMPLANT
STOCKINETTE IMPERV 14X48 (MISCELLANEOUS) ×1 IMPLANT
STRAP TIBIA SHORT (MISCELLANEOUS) ×1 IMPLANT
SUCTION TUBE FRAZIER 10FR DISP (SUCTIONS) ×1 IMPLANT
SUT VIC AB 0 CT1 36 (SUTURE) ×1 IMPLANT
SUT VIC AB 1 CT1 36 (SUTURE) ×2 IMPLANT
SUT VIC AB 2-0 CT2 27 (SUTURE) ×1 IMPLANT
SYR 30ML LL (SYRINGE) ×2 IMPLANT
TIBIAL BASE ROT PLAT SZ 7 KNEE (Knees) ×1 IMPLANT
TIP FAN IRRIG PULSAVAC PLUS (DISPOSABLE) ×1 IMPLANT
TOWEL OR 17X26 4PK STRL BLUE (TOWEL DISPOSABLE) IMPLANT
TOWER CARTRIDGE SMART MIX (DISPOSABLE) ×1 IMPLANT
TRAP FLUID SMOKE EVACUATOR (MISCELLANEOUS) ×1 IMPLANT
TRAY FOLEY MTR SLVR 16FR STAT (SET/KITS/TRAYS/PACK) ×1 IMPLANT
WATER STERILE IRR 1000ML POUR (IV SOLUTION) ×1 IMPLANT
WRAPON POLAR PAD KNEE (MISCELLANEOUS) ×1

## 2023-05-15 NOTE — Op Note (Signed)
OPERATIVE NOTE  DATE OF SURGERY:  05/15/2023  PATIENT NAME:  Erica Powers   DOB: 1960-12-16  MRN: [id number]  PRE-OPERATIVE DIAGNOSIS: Degenerative arthrosis of the left knee, primary  POST-OPERATIVE DIAGNOSIS:  Same  PROCEDURE:  Left total knee arthroplasty using computer-assisted navigation  SURGEON:  Jena Gauss. M.D.  ASSISTANT:  Gean Birchwood, PA-C (present and scrubbed throughout the case, critical for assistance with exposure, retraction, instrumentation, and closure)  ANESTHESIA: spinal  ESTIMATED BLOOD LOSS: 50 mL  FLUIDS REPLACED: 1300 mL of crystalloid  TOURNIQUET TIME: 114 minutes  DRAINS: 2 medium Hemovac drains  SOFT TISSUE RELEASES: Anterior cruciate ligament, posterior cruciate ligament, deep medial collateral ligament, patellofemoral ligament, posterolateral corner, and pie-crusting of the IT band  IMPLANTS UTILIZED: DePuy Attune size 8 posterior stabilized femoral component (cemented), size 7 rotating platform tibial component (cemented), 41 mm medialized dome patella (cemented), and a 5 mm stabilized rotating platform polyethylene insert.  INDICATIONS FOR SURGERY: Erica Powers is a 62 y.o. year old female with a long history of progressive knee pain. X-rays demonstrated severe degenerative changes in tricompartmental fashion. The patient had not seen any significant improvement despite conservative nonsurgical intervention. After discussion of the risks and benefits of surgical intervention, the patient expressed understanding of the risks benefits and agree with plans for total knee arthroplasty.   The risks, benefits, and alternatives were discussed at length including but not limited to the risks of infection, bleeding, nerve injury, stiffness, blood clots, the need for revision surgery, cardiopulmonary complications, among others, and they were willing to proceed.  PROCEDURE IN DETAIL: The patient was brought into the operating room and, after  adequate spinal anesthesia was achieved, a tourniquet was placed on the patient's upper thigh. The patient's knee and leg were cleaned and prepped with alcohol and DuraPrep and draped in the usual sterile fashion. A "timeout" was performed as per usual protocol. The lower extremity was exsanguinated using an Esmarch, and the tourniquet was inflated to 300 mmHg. An anterior longitudinal incision was made followed by a standard mid vastus approach. The deep fibers of the medial collateral ligament were elevated in a subperiosteal fashion off of the medial flare of the tibia so as to maintain a continuous soft tissue sleeve. The patella was subluxed laterally and the patellofemoral ligament was incised. Inspection of the knee demonstrated severe degenerative changes with full-thickness loss of articular cartilage. Osteophytes were debrided using a rongeur. Anterior and posterior cruciate ligaments were excised. Two 4.0 mm Schanz pins were inserted in the femur and into the tibia for attachment of the array of trackers used for computer-assisted navigation. Hip center was identified using a circumduction technique. Distal landmarks were mapped using the computer. The distal femur and proximal tibia were mapped using the computer. The distal femoral cutting guide was positioned using computer-assisted navigation so as to achieve a 5 distal valgus cut. The femur was sized and it was felt that a size 8 femoral component was appropriate. A size 8 femoral cutting guide was positioned and the anterior cut was performed and verified using the computer. This was followed by completion of the posterior and chamfer cuts. Femoral cutting guide for the central box was then positioned in the center box cut was performed.  Attention was then directed to the proximal tibia. Medial and lateral menisci were excised. The extramedullary tibial cutting guide was positioned using computer-assisted navigation so as to achieve a 0  varus-valgus alignment and 3 posterior slope. The cut was performed  and verified using the computer. The proximal tibia was sized and it was felt that a size 7 tibial tray was appropriate. Tibial and femoral trials were inserted followed by insertion of a 5 mm polyethylene insert. The knee was felt to be tight laterally.  The trial components were removed and the knee was brought into full extension and distracted using the Moreland retractors.  The posterolateral corner was carefully released using a combination of electrocautery and Metzenbaum scissors.  The IT band was tight and scalpel was used to piecrust the IT band.  Trial components were reinserted followed by placement of a 5 mm polyethylene trial.  This allowed for excellent mediolateral soft tissue balancing both in flexion and in full extension. Finally, the patella was cut and prepared so as to accommodate a 41 mm medialized dome patella. A patella trial was placed and the knee was placed through a range of motion with excellent patellar tracking appreciated. The femoral trial was removed after debridement of posterior osteophytes. The central post-hole for the tibial component was reamed followed by insertion of a keel punch. Tibial trials were then removed. Cut surfaces of bone were irrigated with copious amounts of normal saline using pulsatile lavage and then suctioned dry. Polymethylmethacrylate cement with gentamicin was prepared in the usual fashion using a vacuum mixer. Cement was applied to the cut surface of the proximal tibia as well as along the undersurface of a size 7 rotating platform tibial component. Tibial component was positioned and impacted into place. Excess cement was removed using Personal assistant. Cement was then applied to the cut surfaces of the femur as well as along the posterior flanges of the size 8 femoral component. The femoral component was positioned and impacted into place. Excess cement was removed using Research scientist (life sciences). A 5 mm polyethylene trial was inserted and the knee was brought into full extension with steady axial compression applied. Finally, cement was applied to the backside of a 41 mm medialized dome patella and the patellar component was positioned and patellar clamp applied. Excess cement was removed using Personal assistant. After adequate curing of the cement, the tourniquet was deflated after a total tourniquet time of 114 minutes. Hemostasis was achieved using electrocautery. The knee was irrigated with copious amounts of normal saline using pulsatile lavage followed by 450 ml of Surgiphor and then suctioned dry. 20 mL of 1.3% Exparel and 60 mL of 0.25% Marcaine in 40 mL of normal saline was injected along the posterior capsule, medial and lateral gutters, and along the arthrotomy site. A 5 mm stabilized rotating platform polyethylene insert was inserted and the knee was placed through a range of motion with excellent mediolateral soft tissue balancing appreciated and excellent patellar tracking noted. 2 medium drains were placed in the wound bed and brought out through separate stab incisions. The medial parapatellar portion of the incision was reapproximated using interrupted sutures of #1 Vicryl. Subcutaneous tissue was approximated in layers using first #0 Vicryl followed #2-0 Vicryl. The skin was approximated with skin staples. A sterile dressing was applied.  The patient tolerated the procedure well and was transported to the recovery room in stable condition.    Shota Kohrs P. Angie Fava., M.D.

## 2023-05-15 NOTE — Plan of Care (Signed)
  Problem: Pain Management: Goal: Pain level will decrease with appropriate interventions Outcome: Progressing   Problem: Skin Integrity: Goal: Will show signs of wound healing Outcome: Progressing   

## 2023-05-15 NOTE — Interval H&P Note (Signed)
History and Physical Interval Note:  05/15/2023 10:49 AM  Erica Powers  has presented today for surgery, with the diagnosis of PRIMARY OSTEOARTHRITIS OF LEFT KNEE..  The various methods of treatment have been discussed with the patient and family. After consideration of risks, benefits and other options for treatment, the patient has consented to  Procedure(s): COMPUTER ASSISTED TOTAL KNEE ARTHROPLASTY (Left) as a surgical intervention.  The patient's history has been reviewed, patient examined, no change in status, stable for surgery.  I have reviewed the patient's chart and labs.  Questions were answered to the patient's satisfaction.     Maria Coin P Henrick Mcgue

## 2023-05-15 NOTE — Anesthesia Preprocedure Evaluation (Signed)
Anesthesia Evaluation  Patient identified by MRN, date of birth, ID band Patient awake    Reviewed: Allergy & Precautions, H&P , NPO status , Patient's Chart, lab work & pertinent test results  Airway Mallampati: II  TM Distance: >3 FB Neck ROM: full    Dental no notable dental hx. (+) Dental Advidsory Given   Pulmonary neg pulmonary ROS, asthma , sleep apnea    Pulmonary exam normal breath sounds clear to auscultation       Cardiovascular hypertension, (-) angina negative cardio ROS Normal cardiovascular exam Rhythm:regular Rate:Normal     Neuro/Psych negative neurological ROS  negative psych ROS   GI/Hepatic negative GI ROS, Neg liver ROS,GERD  ,,  Endo/Other  negative endocrine ROSdiabetes    Renal/GU      Musculoskeletal   Abdominal   Peds  Hematology negative hematology ROS (+)   Anesthesia Other Findings Past Medical History: No date: Acute right-sided low back pain No date: Anemia No date: Arthritis No date: Asthma No date: Complication of anesthesia     Comment:  Woke during IVC filter removal No date: Diabetes mellitus without complication (HCC) No date: Difficult intubation     Comment:  pt denies 01/15/2015: DVT, lower extremity (HCC)     Comment:  right leg that went to lung No date: GERD (gastroesophageal reflux disease) No date: History of hiatal hernia No date: Hypertension No date: Osteoarthritis of lumbar spine, unspecified spinal  osteoarthritis complication status 01/15/2015: Pulmonary embolism (HCC) No date: Rheumatoid factor positive No date: Sleep apnea  Past Surgical History: No date: BREAST BIOPSY; Bilateral     Comment:  bilat bx done neg at [facility]  2000: BREAST SURGERY     Comment:  Reduction No date: CESAREAN SECTION No date: CHOLECYSTECTOMY 04/08/2015: COLONOSCOPY; N/A     Comment:  Procedure: COLONOSCOPY;  Surgeon: Midge Minium, MD;                Location: [facility] SUR[facility]ERY  [facility];  Service:               Gastroenterology;  Laterality: N/A; 09/29/2020: COLONOSCOPY WITH PROPOFOL; N/A     Comment:  Procedure: COLONOSCOPY WITH PROPOFOL;  Surgeon: Toney Reil, MD;  Location: [facility] SURGERY [facility];                Service: Endoscopy;  Laterality: N/A; 07/07/2015: ESOPHAGOGASTRODUODENOSCOPY (EGD) WITH PROPOFOL; N/A     Comment:  Procedure: ESOPHAGOGASTRODUODENOSCOPY (EGD) WITH               PROPOFOL;  Surgeon: Midge Minium, MD;  Location: [facility]               SURGERY CNTR;  Service: Endoscopy;  Laterality: N/A; 09/29/2020: ESOPHAGOGASTRODUODENOSCOPY (EGD) WITH PROPOFOL; N/A     Comment:  Procedure: ESOPHAGOGASTRODUODENOSCOPY (EGD) WITH               PROPOFOL;  Surgeon: Toney Reil, MD;  Location:               [facility] SURGERY CNTR;  Service: Endoscopy;  Laterality:               N/A; 11/01/2015: GASTRIC ROUX-EN-Y; N/A     Comment:  Procedure: LAPAROSCOPIC ROUX-EN-Y GASTRIC, HIATAL HERNIA              REPAIR, EXTENSIVE LYSIS OF ADHESIONS;  Surgeon: Tyrone Apple  Alva Garnet, MD;  Location: [facility] ORS;  Service: General;                Laterality: N/A; No date: HEEL SPUR SURGERY; Bilateral 01/19/2019: IVC FILTER INSERTION; N/A     Comment:  Procedure: IVC FILTER INSERTION;  Surgeon: Annice Needy,              MD;  Location: [facility] INVASIVE CV LAB;  Service:               Cardiovascular;  Laterality: N/A; 04/15/2023: IVC FILTER INSERTION; N/A     Comment:  Procedure: IVC FILTER INSERTION;  Surgeon: Annice Needy,              MD;  Location: [facility] INVASIVE CV LAB;  Service:               Cardiovascular;  Laterality: N/A; 04/16/2019: IVC FILTER REMOVAL 04/16/2019: IVC FILTER REMOVAL; N/A     Comment:  Procedure: IVC FILTER REMOVAL;  Surgeon: Annice Needy,               MD;  Location: [facility] INVASIVE CV LAB;  Service:               Cardiovascular;  Laterality: N/A; 2000: REDUCTION MAMMAPLASTY; Bilateral 2009: ROTATOR CUFF REPAIR; Right No date:  TONSILLECTOMY 01/21/2019: TOTAL KNEE ARTHROPLASTY; Right     Comment:  Procedure: TOTAL KNEE ARTHROPLASTY with navigation;                Surgeon: Donato Heinz, MD;  Location: [facility] ORS;                Service: Orthopedics;  Laterality: Right; No date: TUBAL LIGATION  BMI    Body Mass Index: 30.12 kg/m      Reproductive/Obstetrics negative OB ROS                             Anesthesia Physical Anesthesia Plan  ASA: 3  Anesthesia Plan: General/Spinal   Post-op Pain Management:    Induction: Intravenous  PONV Risk Score and Plan: 3 and Treatment may vary due to age or medical condition, Propofol infusion, TIVA, Ondansetron and Midazolam  Airway Management Planned: Nasal Cannula and Natural Airway  Additional Equipment:   Intra-op Plan:   Post-operative Plan: Extubation in OR  Informed Consent: I have reviewed the patients History and Physical, chart, labs and discussed the procedure including the risks, benefits and alternatives for the proposed anesthesia with the patient or authorized representative who has indicated his/her understanding and acceptance.     Dental Advisory Given  Plan Discussed with: Anesthesiologist, CRNA and Surgeon  Anesthesia Plan Comments: (Patient reports no bleeding problems and no anticoagulant use.  Plan for spinal with backup GA  Patient consented for risks of anesthesia including but not limited to:  - adverse reactions to medications - damage to eyes, teeth, lips or other oral mucosa - nerve damage due to positioning  - risk of bleeding, infection and or nerve damage from spinal that could lead to paralysis - risk of headache or failed spinal - damage to teeth, lips or other oral mucosa - sore throat or hoarseness - damage to heart, brain, nerves, lungs, other parts of body or loss of life  Patient voiced understanding.)        Anesthesia Quick Evaluation

## 2023-05-15 NOTE — Progress Notes (Signed)
PT Cancellation Note  Patient Details Name: Erica Powers MRN: [id number] DOB: [birth year]   Cancelled Treatment:    Reason Eval/Treat Not Completed: Medical issues which prohibited therapy Pt in PACU at 16:30, still with no AROM in entire L LE, numb.  Pt joking and used R LE to PROM move L ankle but pt is not ready/safe to initiate PT this date.  Will attempt PT eval tomorrow AM.   Malachi Pro, DPT 05/15/2023, 5:13 PM

## 2023-05-15 NOTE — Anesthesia Procedure Notes (Addendum)
Spinal  Patient location during procedure: OR Start time: 05/15/2023 11:30 AM End time: 05/15/2023 11:46 AM Staffing Performed: anesthesiologist, resident/CRNA and other anesthesia staff  Anesthesiologist: Stephanie Coup, MD Resident/CRNA: Merlene Pulling, CRNA Other anesthesia staff: Allayne Butcher, RN Performed by: Merlene Pulling, CRNA Authorized by: Stephanie Coup, MD   Preanesthetic Checklist Completed: patient identified, IV checked, site marked, risks and benefits discussed, surgical consent, monitors and equipment checked, pre-op evaluation and timeout performed Spinal Block Patient position: sitting Prep: ChloraPrep Patient monitoring: heart rate, continuous pulse ox and blood pressure Approach: midline Location: L3-4 Injection technique: single-shot Needle Needle type: Quincke  Needle gauge: 22 G Assessment Sensory level: T10 Events: injection painful, paresthesia, CSF return and second provider Additional Notes Maddie attempt, Christophor Eick attempt, Woodson attempt and success.

## 2023-05-15 NOTE — Transfer of Care (Cosign Needed)
Immediate Anesthesia Transfer of Care Note  Patient: Erica Powers  Procedure(s) Performed: COMPUTER ASSISTED TOTAL KNEE ARTHROPLASTY (Left: Knee)  Patient Location: PACU  Anesthesia Type:Spinal  Level of Consciousness: awake  Airway & Oxygen Therapy: Patient Spontanous Breathing and Patient connected to face mask oxygen  Post-op Assessment: Report given to RN and Post -op Vital signs reviewed and stable  Post vital signs: Reviewed  Last Vitals:  Vitals Value Taken Time  BP 130/93 05/15/23 1547  Temp 4F   Pulse 69 05/15/23 1550  Resp 17 05/15/23 1550  SpO2 99 % 05/15/23 1550  Vitals shown include unvalidated device data.  Last Pain:  Vitals:   05/15/23 0936  TempSrc: Temporal  PainSc: 0-No pain         Complications: No notable events documented.

## 2023-05-16 ENCOUNTER — Encounter: Payer: Self-pay | Admitting: Orthopedic Surgery

## 2023-05-16 DIAGNOSIS — M1712 Unilateral primary osteoarthritis, left knee: Secondary | ICD-10-CM | POA: Diagnosis not present

## 2023-05-16 LAB — GLUCOSE, CAPILLARY
Glucose-Capillary: 62 mg/dL — ABNORMAL LOW (ref 70–99)
Glucose-Capillary: 133 mg/dL — ABNORMAL HIGH (ref 70–99)

## 2023-05-16 MED ORDER — TRAMADOL HCL 50 MG PO TABS
ORAL_TABLET | ORAL | Status: AC
  Filled 2023-05-16: qty 1

## 2023-05-16 MED ORDER — OXYCODONE HCL 5 MG PO TABS
ORAL_TABLET | ORAL | Status: AC
  Filled 2023-05-16: qty 2

## 2023-05-16 MED ORDER — CELECOXIB 200 MG PO CAPS
ORAL_CAPSULE | ORAL | Status: AC
  Filled 2023-05-16: qty 1

## 2023-05-16 MED ORDER — CELECOXIB 200 MG PO CAPS: 200.0000 mg | ORAL_CAPSULE | Freq: Two times a day (BID) | ORAL | 1 refills | Status: AC

## 2023-05-16 MED ORDER — ENOXAPARIN SODIUM 30 MG/0.3ML IJ SOSY
PREFILLED_SYRINGE | INTRAMUSCULAR | Status: AC
  Filled 2023-05-16: qty 0.3

## 2023-05-16 MED ORDER — ENOXAPARIN SODIUM 40 MG/0.4ML IJ SOSY: 40.0000 mg | PREFILLED_SYRINGE | INTRAMUSCULAR | 0 refills | Status: AC

## 2023-05-16 MED ORDER — FERROUS SULFATE 325 (65 FE) MG PO TABS
ORAL_TABLET | ORAL | Status: AC
  Filled 2023-05-16: qty 1

## 2023-05-16 MED ORDER — ACETAMINOPHEN 10 MG/ML IV SOLN
INTRAVENOUS | Status: AC
  Filled 2023-05-16: qty 100

## 2023-05-16 MED ORDER — SENNOSIDES-DOCUSATE SODIUM 8.6-50 MG PO TABS
ORAL_TABLET | ORAL | Status: AC
  Filled 2023-05-16: qty 1

## 2023-05-16 MED ORDER — CEFAZOLIN SODIUM-DEXTROSE 2-4 GM/100ML-% IV SOLN
INTRAVENOUS | Status: AC
  Filled 2023-05-16: qty 100

## 2023-05-16 MED ORDER — TRAMADOL HCL 50 MG PO TABS: 50.0000 mg | ORAL_TABLET | ORAL | 0 refills | Status: DC | PRN

## 2023-05-16 MED ORDER — OXYCODONE HCL 5 MG PO TABS: 5.0000 mg | ORAL_TABLET | ORAL | 0 refills | Status: DC | PRN

## 2023-05-16 MED ORDER — LOSARTAN POTASSIUM 50 MG PO TABS
ORAL_TABLET | ORAL | Status: AC
  Filled 2023-05-16: qty 1

## 2023-05-16 MED ORDER — FLUTICASONE PROPIONATE 50 MCG/ACT NA SUSP: 1.0000 | Freq: Every day | NASAL | Status: DC | PRN

## 2023-05-16 MED ORDER — MAGNESIUM HYDROXIDE 400 MG/5ML PO SUSP
ORAL | Status: AC
  Filled 2023-05-16: qty 30

## 2023-05-16 MED ORDER — PANTOPRAZOLE SODIUM 40 MG PO TBEC
DELAYED_RELEASE_TABLET | ORAL | Status: AC
  Filled 2023-05-16: qty 1

## 2023-05-16 NOTE — Progress Notes (Signed)
Nsg Discharge Note  Admit Date:  05/15/2023 Discharge date: 05/16/2023   Erica Powers to be D/C'd Home per MD order.  AVS completed.  Copy for chart, and copy for patient signed, and dated. Patient/caregiver able to verbalize understanding.  Discharge Medication: Allergies as of 05/16/2023       Reactions   Codeine Itching   Hydrocodone Itching        Medication List     STOP taking these medications    meloxicam 15 MG tablet Commonly known as: MOBIC       TAKE these medications    acetaminophen 500 MG tablet Commonly known as: TYLENOL Take 500 mg by mouth every 6 (six) hours as needed.   albuterol 108 (90 Base) MCG/ACT inhaler Commonly known as: VENTOLIN HFA Inhale 1-2 puffs into the lungs every 4 (four) hours as needed for wheezing or shortness of breath.   azelastine 0.1 % nasal spray Commonly known as: ASTELIN USE 1 TO 2 SPRAYS IN EACH NOSTRIL EVERY 12 HOURS AS NEEDED FOR ALLERGIES   baclofen 10 MG tablet Commonly known as: LIORESAL Take 10 mg by mouth 3 (three) times daily.   Biofreeze 4 % Gel Generic drug: Menthol (Topical Analgesic) Apply 1 Application topically as needed. Left knee   budesonide-formoterol 80-4.5 MCG/ACT inhaler Commonly known as: SYMBICORT Inhale 2 puffs into the lungs 2 (two) times daily as needed.   Calcium 600+D Plus Minerals 600-400 MG-UNIT Tabs Take by mouth.   celecoxib 200 MG capsule Commonly known as: CELEBREX Take 1 capsule (200 mg total) by mouth 2 (two) times daily.   cyanocobalamin 1000 MCG/ML injection Commonly known as: VITAMIN B12 Inject 1ml once a week for 4 weeks then once a month   cyclobenzaprine 5 MG tablet Commonly known as: FLEXERIL Take 1 tablet (5 mg total) by mouth 3 (three) times daily as needed.   enoxaparin 40 MG/0.4ML injection Commonly known as: LOVENOX Inject 0.4 mLs (40 mg total) into the skin daily for 14 days.   ferrous sulfate 325 (65 FE) MG EC tablet Take by mouth.   fluticasone  50 MCG/ACT nasal spray Commonly known as: FLONASE Place 1 spray into both nostrils daily.   losartan 25 MG tablet Commonly known as: COZAAR Take 25 mg by mouth daily.   montelukast 10 MG tablet Commonly known as: SINGULAIR Take 10 mg by mouth at bedtime.   Multi-Vitamin tablet Take by mouth.   oxyCODONE 5 MG immediate release tablet Commonly known as: Oxy IR/ROXICODONE Take 1 tablet (5 mg total) by mouth every 4 (four) hours as needed for moderate pain (pain score 4-6).   Pataday 0.7 % Soln Generic drug: Olopatadine HCl Apply 1 drop to eye as needed. Both eyes as needed   Semaglutide-Weight Management 2.4 MG/0.75ML Soaj Inject 2.4 mg into the skin once a week.   traMADol 50 MG tablet Commonly known as: ULTRAM Take 1-2 tablets (50-100 mg total) by mouth every 4 (four) hours as needed for moderate pain.   Vitamin D (Ergocalciferol) 1.25 MG (50000 UNIT) Caps capsule Commonly known as: DRISDOL TAKE 1 CAPSULE BY MOUTH EVERY 7 DAYS               Durable Medical Equipment  (From admission, onward)           Start     Ordered   05/15/23 1725  DME Walker rolling  Once       Question:  Patient needs a walker to treat with the following  condition  Answer:  Total knee replacement status   05/15/23 1724   05/15/23 1725  DME Bedside commode  Once       Comments: Patient is not able to walk the distance required to go the bathroom, or he/she is unable to safely negotiate stairs required to access the bathroom.  A 3in1 BSC will alleviate this problem  Question:  Patient needs a bedside commode to treat with the following condition  Answer:  Total knee replacement status   05/15/23 1724            Discharge Assessment: Vitals:   05/16/23 0411 05/16/23 1330  BP: (!) 153/71 122/70  Pulse: (!) 58 71  Resp: 18 20  Temp: 97.6 F (36.4 C) 98 F (36.7 C)  SpO2: 99% 99%   Skin clean, dry and intact without evidence of skin break down, no evidence of skin tears  noted. IV catheter discontinued intact. Site without signs and symptoms of complications - no redness or edema noted at insertion site, patient denies c/o pain - only slight tenderness at site.  Dressing with slight pressure applied.  D/c Instructions-Education: Discharge instructions given to patient/family with verbalized understanding. D/c education completed with patient/family including follow up instructions, medication list, d/c activities limitations if indicated, with other d/c instructions as indicated by MD - patient able to verbalize understanding, all questions fully answered. Patient instructed to return to ED, call 911, or call MD for any changes in condition.  Patient escorted via WC, and D/C home via private auto.  Theodore Demark, RN 05/16/2023 3:35 PM

## 2023-05-16 NOTE — Progress Notes (Signed)
Patient is not able to walk the distance required to go the bathroom, or he/she is unable to safely negotiate stairs required to access the bathroom.  A 3in1 BSC will alleviate this problem   Erica Powers P. Erica Powers, Jr. M.D.  

## 2023-05-16 NOTE — Evaluation (Signed)
Occupational Therapy Evaluation Patient Details Name: Erica Powers MRN: [id number] DOB: 1961/08/15 Today's Date: 05/16/2023   History of Present Illness Pt is a 62 y.o. female with PMH of DVT (LLE), bilateral PE, OA, HTN, DM type 2, anemia, asthma. Pt is now s/p elective L TKA (6/12).   Clinical Impression   Upon entering the room, pt seated in recliner chair and agreeable to OT intervention. Pt endorses living at home alone but will have teenage granddaughter home to assist her as needed as well as several other family members. OT reviewed polar care system and how to increase Ind with LB self care after surgical procedure. Pt able to fully dress self this session with supervision secondary to cuing needed for proper technique. Pt having recently received pain medication and felt like she needed to lay down. Pt stands with supervision and ambulates with RW to bed. Sit >supine without assistance. Call bell and all needed items within reach. Pt with no further need for skilled OT intervention. OT to sign off and pt agrees.      Recommendations for follow up therapy are one component of a multi-disciplinary discharge planning process, led by the attending physician.  Recommendations may be updated based on patient status, additional functional criteria and insurance authorization.   Assistance Recommended at Discharge Intermittent Supervision/Assistance  Patient can return home with the following Assistance with cooking/housework;Assist for transportation;Help with stairs or ramp for entrance    Functional Status Assessment  Patient has had a recent decline in their functional status and demonstrates the ability to make significant improvements in function in a reasonable and predictable amount of time.  Equipment Recommendations  None recommended by OT       Precautions / Restrictions Precautions Precautions: Fall Restrictions Weight Bearing Restrictions: Yes LLE Weight Bearing: Weight  bearing as tolerated      Mobility Bed Mobility Overal bed mobility: Modified Independent Bed Mobility: Sit to Supine       Sit to supine: Modified independent (Device/Increase time)   General bed mobility comments: increased time and effort but no physical assistance needed    Transfers Overall transfer level: Needs assistance Equipment used: Rolling walker (2 wheels) Transfers: Sit to/from Stand Sit to Stand: Supervision                  Balance Overall balance assessment: Needs assistance Sitting-balance support: Feet supported, No upper extremity supported Sitting balance-Leahy Scale: Normal     Standing balance support: Bilateral upper extremity supported, During functional activity Standing balance-Leahy Scale: Good                             ADL either performed or assessed with clinical judgement   ADL Overall ADL's : Needs assistance/impaired                 Upper Body Dressing : Set up;Sitting   Lower Body Dressing: Supervision/safety;Sit to/from stand   Toilet Transfer: Supervision/safety;Rolling walker (2 wheels) Toilet Transfer Details (indicate cue type and reason): simulated                 Vision Patient Visual Report: No change from baseline              Pertinent Vitals/Pain Pain Assessment Pain Assessment: 0-10 Pain Score: 5  Pain Location: L knee Pain Descriptors / Indicators: Discomfort Pain Intervention(s): Premedicated before session, Monitored during session, Repositioned, Ice applied     Hand  Dominance Right   Extremity/Trunk Assessment Upper Extremity Assessment Upper Extremity Assessment: Overall WFL for tasks assessed   Lower Extremity Assessment Lower Extremity Assessment: Generalized weakness       Communication Communication Communication: No difficulties   Cognition Arousal/Alertness: Awake/alert Behavior During Therapy: WFL for tasks assessed/performed Overall Cognitive Status:  Within Functional Limits for tasks assessed                                                  Home Living Family/patient expects to be discharged to:: Private residence Living Arrangements: Alone Available Help at Discharge: Family;Available PRN/intermittently Type of Home: House Home Access: Stairs to enter [employer] of Steps: 6 front, 3 back Entrance Stairs-Rails: Left Home Layout: Two level;Bed/bath upstairs Alternate Level Stairs-Number of Steps: flight   Bathroom Shower/Tub: Chief Strategy Officer: Standard Bathroom Accessibility: Yes   Home Equipment: Cane - single point   Additional Comments: Pt reports family has RW and 3 in 1 available for her to use at discharge      Prior Functioning/Environment Prior Level of Function : Independent/Modified Independent             Mobility Comments: Community ambulator, no AD pre-surgery. Works as a Lawyer. ADLs Comments: Independent with all ADL's, IADL's                 OT Goals(Current goals can be found in the care plan section) Acute Rehab OT Goals Patient Stated Goal: to go home OT Goal Formulation: With patient Time For Goal Achievement: 05/16/23 Potential to Achieve Goals: Fair  OT Frequency:         AM-PAC OT "6 Clicks" Daily Activity     Outcome Measure Help from another person eating meals?: None Help from another person taking care of personal grooming?: None Help from another person toileting, which includes using toliet, bedpan, or urinal?: None Help from another person bathing (including washing, rinsing, drying)?: None Help from another person to put on and taking off regular upper body clothing?: None Help from another person to put on and taking off regular lower body clothing?: None 6 Click Score: 24   End of Session Equipment Utilized During Treatment: Rolling walker (2 wheels) Nurse Communication: Mobility status  Activity Tolerance: Patient  tolerated treatment well Patient left: in bed;with call bell/phone within reach                   Time: 1000-1016 OT Time Calculation (min): 16 min Charges:  OT General Charges $OT Visit: 1 Visit OT Evaluation $OT Eval Low Complexity: 1 Low OT Treatments $Self Care/Home Management : 8-22 mins  Jackquline Denmark, MS, OTR/L , CBIS ascom [phone]  05/16/23, 1:00 PM

## 2023-05-16 NOTE — Progress Notes (Signed)
Subjective: 1 Day Post-Op Procedure(s) (LRB): COMPUTER ASSISTED TOTAL KNEE ARTHROPLASTY (Left) Patient reports pain as mild.   Patient seen in rounds with Dr. [name]. Patient is well, and has had no acute complaints or problems We will start therapy today.  Plan is to go Home after hospital stay.  Objective: Vital signs in last 24 hours: Temp:  [97.2 F (36.2 C)-98.7 F (37.1 C)] 97.6 F (36.4 C) (06/13 0411) Pulse Rate:  [57-77] 58 (06/13 0411) Resp:  [12-20] 18 (06/13 0411) BP: (113-153)/(50-93) 153/71 (06/13 0411) SpO2:  [97 %-100 %] 99 % (06/13 0411) Weight:  [82.1 kg] 82.1 kg (06/12 0936)  Intake/Output from previous day:  Intake/Output Summary (Last 24 hours) at 05/16/2023 0759 Last data filed at 05/16/2023 0412 Gross per 24 hour  Intake 2286.67 ml  Output 1150 ml  Net 1136.67 ml    Intake/Output this shift: No intake/output data recorded.  Labs: No results for input(s): "HGB" in the last 72 hours. No results for input(s): "WBC", "RBC", "HCT", "PLT" in the last 72 hours. No results for input(s): "NA", "K", "CL", "CO2", "BUN", "CREATININE", "GLUCOSE", "CALCIUM" in the last 72 hours. No results for input(s): "LABPT", "INR" in the last 72 hours.  EXAM General - Patient is Alert, Appropriate, and Oriented Extremity - Neurologically intact ABD soft Neurovascular intact Sensation intact distally Intact pulses distally Dorsiflexion/Plantar flexion intact No cellulitis present Compartment soft Dressing -  patient Katrinka Blazing dressing was left in place given that she has been having output into her JP drain.  Will reassess her Aquacel bandage and drain later today. Motor Function - intact, moving foot and toes well on exam.  Patient able to plantar and dorsiflex with good range of motion and strength.  Has some difficulty performing straight leg raise but is able to do this action.  Patient is neurovascularly intact down all lower leg extremity dermatomes.  Posterior tibial  pulses appreciated 2+ JP drain remains in place  Past Medical History:  Diagnosis Date   Acute right-sided low back pain    Anemia    Arthritis    Asthma    Complication of anesthesia    Woke during IVC filter removal   Diabetes mellitus without complication (HCC)    Difficult intubation    pt denies   DVT, lower extremity (HCC) 01/15/2015   right leg that went to lung   GERD (gastroesophageal reflux disease)    History of hiatal hernia    Hypertension    Osteoarthritis of lumbar spine, unspecified spinal osteoarthritis complication status    Pulmonary embolism (HCC) 01/15/2015   Rheumatoid factor positive    Sleep apnea     Assessment/Plan: 1 Day Post-Op Procedure(s) (LRB): COMPUTER ASSISTED TOTAL KNEE ARTHROPLASTY (Left) Principal Problem:   Total knee replacement status  Estimated body mass index is 30.12 kg/m as calculated from the following:   Height as of this encounter: 5\' 5"  (1.651 m).   Weight as of this encounter: 82.1 kg. Advance diet Up with therapy -start working with physical therapy.  Discussed the necessary PT protocols before discharge  Jones dressing and drain remains in place given the patient has had some output into her JP drain.  Will reassess later today and plan to pull drain  Aquacel bandage remains in place  Continue to utilize bone foam and Polar Care  Discussed sending the patient home with Lovenox for blood thinning and DVT prophylaxis.  Patient will also be sent home with Celebrex for pain and inflammation.  Patient  will also have oxycodone and tramadol for as needed pain.  DVT Prophylaxis - Lovenox, TED hose, SCDs Weight-Bearing as tolerated to left leg D/C O2 and Pulse OX and try on Room Air  Plan is for patient to follow-up with [facility] clinic orthopedics in 2 weeks to have staples removed and reevaluation of her left knee.  Rayburn Go, PA-C [facility] 05/16/2023, 7:59 AM

## 2023-05-16 NOTE — Anesthesia Postprocedure Evaluation (Signed)
Anesthesia Post Note  Patient: Erica Powers  Procedure(s) Performed: COMPUTER ASSISTED TOTAL KNEE ARTHROPLASTY (Left: Knee)  Patient location during evaluation: Nursing Unit Anesthesia Type: Spinal Level of consciousness: oriented and awake and alert Pain management: pain level controlled Vital Signs Assessment: post-procedure vital signs reviewed and stable Respiratory status: spontaneous breathing and respiratory function stable Cardiovascular status: blood pressure returned to baseline and stable Postop Assessment: no headache, no backache, no apparent nausea or vomiting and patient able to bend at knees Anesthetic complications: no  No notable events documented.   Last Vitals:  Vitals:   05/15/23 2153 05/16/23 0411  BP: 118/80 (!) 153/71  Pulse: 65 (!) 58  Resp: 18 18  Temp: 36.4 C 36.4 C  SpO2: 97% 99%    Last Pain:  Vitals:   05/16/23 0709  TempSrc:   PainSc: Asleep                 Starling Manns

## 2023-05-16 NOTE — TOC Progression Note (Signed)
Transition of Care [facility]) - Progression Note    Patient Details  Name: Erica Powers MRN: [id number] Date of Birth: May 06, 1961  [facility] of [facility] CM/SW Contact  Marlowe Sax, RN Phone Number: 05/16/2023, 8:59 AM  Clinical Narrative:     The patient is set up with [facility] prior to surgery by Surgeons office RW and 3 in1 requested to be delivered by [employer] to the bedside       Expected Discharge Plan and Services                                               Social Determinants of Health (SDOH) Interventions SDOH Screenings   Food Insecurity: No Food Insecurity (05/15/2023)  Housing: Low Risk  (05/15/2023)  Transportation Needs: No Transportation Needs (05/15/2023)  Utilities: Not At Risk (05/15/2023)  Financial Resource Strain: Low Risk  (04/16/2019)  Social Connections: Unknown (04/16/2019)  Stress: No Stress Concern Present (04/16/2019)  Tobacco Use: Low Risk  (05/15/2023)    Readmission Risk Interventions     No data to display

## 2023-05-16 NOTE — Discharge Summary (Addendum)
Physician Discharge Summary  Subjective: 1 Day Post-Op Procedure(s) (LRB): COMPUTER ASSISTED TOTAL KNEE ARTHROPLASTY (Left) Patient reports pain as mild.   Patient seen in rounds with Dr. [name]. Patient is well, and has had no acute complaints or problems Patient is ready to go home  Physician Discharge Summary  Patient ID: Erica Powers MRN: [id number] DOB/AGE: [birth year] 62 y.o.  Admit date: 05/15/2023 Discharge date: 05/16/2023  Admission Diagnoses:  Discharge Diagnoses:  Principal Problem:   Total knee replacement status   Discharged Condition: good  Hospital Course: Patient presents to the hospital on 05/15/2023 for an elective left total knee arthroplasty performed by Dr. [name].  Patient was given 2 g of Ancef and 1 g of TXA perioperatively.  Patient tolerated the surgery well without any complications.  See operative details below.  Postoperatively, the patient experienced some increased drainage in her JP drain.  The drain was left in for the next half of postop day 1.  It was removed after lunchtime, still noticed to have some output however mildly slowed.  Otherwise patient's vital signs are stable and she has no acute complaints or concerns.  Patient is stable for discharge  PROCEDURE:  Left total knee arthroplasty using computer-assisted navigation   SURGEON:  Jena Gauss. M.D.   ASSISTANT:  Gean Birchwood, PA-C (present and scrubbed throughout the case, critical for assistance with exposure, retraction, instrumentation, and closure)   ANESTHESIA: spinal   ESTIMATED BLOOD LOSS: 50 mL   FLUIDS REPLACED: 1300 mL of crystalloid   TOURNIQUET TIME: 114 minutes   DRAINS: 2 medium Hemovac drains   SOFT TISSUE RELEASES: Anterior cruciate ligament, posterior cruciate ligament, deep medial collateral ligament, patellofemoral ligament, posterolateral corner, and pie-crusting of the IT band   IMPLANTS UTILIZED: DePuy Attune size 8 posterior stabilized femoral  component (cemented), size 7 rotating platform tibial component (cemented), 41 mm medialized dome patella (cemented), and a 5 mm stabilized rotating platform polyethylene insert. \  Treatments: None  Discharge Exam: Blood pressure (!) 153/71, pulse (!) 58, temperature 97.6 F (36.4 C), temperature source Temporal, resp. rate 18, height 5\' 5"  (1.651 m), weight 82.1 kg, SpO2 99 %.   Disposition: Discharge disposition: 01-Home or Self Care        Allergies as of 05/16/2023       Reactions   Codeine Itching   Hydrocodone Itching        Medication List     STOP taking these medications    meloxicam 15 MG tablet Commonly known as: MOBIC       TAKE these medications    acetaminophen 500 MG tablet Commonly known as: TYLENOL Take 500 mg by mouth every 6 (six) hours as needed.   albuterol 108 (90 Base) MCG/ACT inhaler Commonly known as: VENTOLIN HFA Inhale 1-2 puffs into the lungs every 4 (four) hours as needed for wheezing or shortness of breath.   azelastine 0.1 % nasal spray Commonly known as: ASTELIN USE 1 TO 2 SPRAYS IN EACH NOSTRIL EVERY 12 HOURS AS NEEDED FOR ALLERGIES   baclofen 10 MG tablet Commonly known as: LIORESAL Take 10 mg by mouth 3 (three) times daily.   Biofreeze 4 % Gel Generic drug: Menthol (Topical Analgesic) Apply 1 Application topically as needed. Left knee   budesonide-formoterol 80-4.5 MCG/ACT inhaler Commonly known as: SYMBICORT Inhale 2 puffs into the lungs 2 (two) times daily as needed.   Calcium 600+D Plus Minerals 600-400 MG-UNIT Tabs Take by mouth.   celecoxib 200 MG capsule  Commonly known as: CELEBREX Take 1 capsule (200 mg total) by mouth 2 (two) times daily.   cyanocobalamin 1000 MCG/ML injection Commonly known as: VITAMIN B12 Inject 1ml once a week for 4 weeks then once a month   cyclobenzaprine 5 MG tablet Commonly known as: FLEXERIL Take 1 tablet (5 mg total) by mouth 3 (three) times daily as needed.   enoxaparin  40 MG/0.4ML injection Commonly known as: LOVENOX Inject 0.4 mLs (40 mg total) into the skin daily for 14 days.   ferrous sulfate 325 (65 FE) MG EC tablet Take by mouth.   fluticasone 50 MCG/ACT nasal spray Commonly known as: FLONASE Place 1 spray into both nostrils daily.   losartan 25 MG tablet Commonly known as: COZAAR Take 25 mg by mouth daily.   montelukast 10 MG tablet Commonly known as: SINGULAIR Take 10 mg by mouth at bedtime.   Multi-Vitamin tablet Take by mouth.   oxyCODONE 5 MG immediate release tablet Commonly known as: Oxy IR/ROXICODONE Take 1 tablet (5 mg total) by mouth every 4 (four) hours as needed for moderate pain (pain score 4-6).   Pataday 0.7 % Soln Generic drug: Olopatadine HCl Apply 1 drop to eye as needed. Both eyes as needed   Semaglutide-Weight Management 2.4 MG/0.75ML Soaj Inject 2.4 mg into the skin once a week.   traMADol 50 MG tablet Commonly known as: ULTRAM Take 1-2 tablets (50-100 mg total) by mouth every 4 (four) hours as needed for moderate pain.   Vitamin D (Ergocalciferol) 1.25 MG (50000 UNIT) Caps capsule Commonly known as: DRISDOL TAKE 1 CAPSULE BY MOUTH EVERY 7 DAYS               Durable Medical Equipment  (From admission, onward)           Start     Ordered   05/15/23 1725  DME Walker rolling  Once       Question:  Patient needs a walker to treat with the following condition  Answer:  Total knee replacement status   05/15/23 1724   05/15/23 1725  DME Bedside commode  Once       Comments: Patient is not able to walk the distance required to go the bathroom, or he/she is unable to safely negotiate stairs required to access the bathroom.  A 3in1 BSC will alleviate this problem  Question:  Patient needs a bedside commode to treat with the following condition  Answer:  Total knee replacement status   05/15/23 1724            Follow-up Information     Rayburn Go, PA-C Follow up on 05/30/2023.   Specialty:  Orthopedic Surgery Why: at 2:45pm Contact information: [address] [place] [address] [phone]         Donato Heinz, MD Follow up on 06/27/2023.   Specialty: Orthopedic Surgery Why: at 2:30pm Contact information: [address] [facility] [place] [address] [phone]                 Signed: Gean Birchwood 05/16/2023, 12:24 PM   Objective: Vital signs in last 24 hours: Temp:  [97.2 F (36.2 C)-97.7 F (36.5 C)] 97.6 F (36.4 C) (06/13 0411) Pulse Rate:  [57-77] 58 (06/13 0411) Resp:  [12-18] 18 (06/13 0411) BP: (113-153)/(50-93) 153/71 (06/13 0411) SpO2:  [97 %-100 %] 99 % (06/13 0411)  Intake/Output from previous day:  Intake/Output Summary (Last 24 hours) at 05/16/2023 1224 Last data filed  at 05/16/2023 0412 Gross per 24 hour  Intake 2286.67 ml  Output 1150 ml  Net 1136.67 ml    Intake/Output this shift: No intake/output data recorded.  Labs: No results for input(s): "HGB" in the last 72 hours. No results for input(s): "WBC", "RBC", "HCT", "PLT" in the last 72 hours. No results for input(s): "NA", "K", "CL", "CO2", "BUN", "CREATININE", "GLUCOSE", "CALCIUM" in the last 72 hours. No results for input(s): "LABPT", "INR" in the last 72 hours.  EXAM: General - Patient is Alert, Appropriate, and Oriented Extremity - Neurologically intact ABD soft Neurovascular intact Sensation intact distally Intact pulses distally Dorsiflexion/Plantar flexion intact No cellulitis present Compartment soft Dressing -  patient Katrinka Blazing dressing was left in place given that she has been having output into her JP drain.  Will reassess her Aquacel bandage and drain later today. Motor Function - intact, moving foot and toes well on exam.  Patient able to plantar and dorsiflex with good range of motion and strength.  Does well with performing straight leg raise.  Patient is neurovascularly intact down all lower leg extremity dermatomes.   Posterior tibial pulses appreciated 2+ JP drain pulled, intact  Assessment/Plan: 1 Day Post-Op Procedure(s) (LRB): COMPUTER ASSISTED TOTAL KNEE ARTHROPLASTY (Left) Procedure(s) (LRB): COMPUTER ASSISTED TOTAL KNEE ARTHROPLASTY (Left) Past Medical History:  Diagnosis Date   Acute right-sided low back pain    Anemia    Arthritis    Asthma    Complication of anesthesia    Woke during IVC filter removal   Diabetes mellitus without complication (HCC)    Difficult intubation    pt denies   DVT, lower extremity (HCC) 01/15/2015   right leg that went to lung   GERD (gastroesophageal reflux disease)    History of hiatal hernia    Hypertension    Osteoarthritis of lumbar spine, unspecified spinal osteoarthritis complication status    Pulmonary embolism (HCC) 01/15/2015   Rheumatoid factor positive    Sleep apnea    Principal Problem:   Total knee replacement status  Estimated body mass index is 30.12 kg/m as calculated from the following:   Height as of this encounter: 5\' 5"  (1.651 m).   Weight as of this encounter: 82.1 kg. Patient has passed all of her PT protocols and has been cleared for discharge.  Patient states that she has already had a call to set up home health PT.  Continue to work with strengthening and range of motion at home.   Jones dressing and JP drain were removed.  Drain was intact.  Patient had experienced some increased output on postop day 1.  Seems to have slightly improved even after working with PT.   Aquacel bandage remains in place  Continue to utilize bone foam and Polar Care   Discussed sending the patient home with Lovenox for blood thinning and DVT prophylaxis.  Patient will also be sent home with Celebrex for pain and inflammation.  Patient will also have oxycodone and tramadol for as needed pain.   DVT Prophylaxis - Lovenox, TED hose Weight-Bearing as tolerated to left leg   Plan is for patient to follow-up with [facility] clinic orthopedics in 2  weeks to have staples removed and reevaluation of her left knee. Diet - Regular diet Disposition - Home Condition Upon Discharge - Good   Danise Edge, PA-C Orthopaedic Surgery 05/16/2023, 12:24 PM

## 2023-05-16 NOTE — Evaluation (Addendum)
Physical Therapy Evaluation Patient Details Name: Erica Powers MRN: [id number] DOB: 11-26-1961 Today's Date: 05/16/2023  History of Present Illness  Pt is a 62 y.o. female with PMH of DVT (LLE), bilateral PE, OA, HTN, DM type 2, anemia, asthma. Pt is now s/p elective L TKA (05/15/23), history of R TKA four years prior.  Clinical Impression  Pt very pleasant and tolerated treatment well. She was independent with all bed mobility tasks and required min guard for all transfers for safety. Pt able to ambulate ~200 feet min guard with RW. She demonstrated occasional episodes of mild L knee buckling but was able to self-arrest and required min verbal cues for L quad activation throughout ambulation. Pt able to navigate 8 stairs using either using a rail on the L or backwards RW method. She required min verbal cues and demonstration beforehand but did well performing them and needed very little cueing to remember sequencing and proper RW placement. Pt educated on HEP frequency as well as pain expectations moving forward. Pt will benefit from continued PT services upon discharge to safely address deficits listed in patient problem list for decreased caregiver assistance and eventual return to PLOF.      Recommendations for follow up therapy are one component of a multi-disciplinary discharge planning process, led by the attending physician.  Recommendations may be updated based on patient status, additional functional criteria and insurance authorization.  Follow Up Recommendations       Assistance Recommended at Discharge PRN  Patient can return home with the following  A little help with walking and/or transfers;Assistance with cooking/housework;Assist for transportation;Help with stairs or ramp for entrance;A little help with bathing/dressing/bathroom    Equipment Recommendations Rolling walker (2 wheels);BSC/3in1  Recommendations for Other Services       Functional Status Assessment Patient  has had a recent decline in their functional status and demonstrates the ability to make significant improvements in function in a reasonable and predictable amount of time.     Precautions / Restrictions Precautions Precautions: Fall Restrictions Weight Bearing Restrictions: Yes LLE Weight Bearing: Weight bearing as tolerated      Mobility  Bed Mobility Overal bed mobility: Needs Assistance Bed Mobility: Supine to Sit     Supine to sit: Modified Independent     General bed mobility comments: Pt able to sit EOB with min guard for safety    Transfers Overall transfer level: Needs assistance Equipment used: Rolling walker (2 wheels) Transfers: Sit to/from Stand Sit to Stand: Min guard           General transfer comment: Use of RW but minimal UE support needed, min guard for safety. Good concentric and eccentric control with sitting/standing.    Ambulation/Gait Ambulation/Gait assistance: Min guard Gait Distance (Feet): 200 Feet Assistive device: Rolling walker (2 wheels) Gait Pattern/deviations: Step-through pattern, Decreased step length - right, Decreased step length - left, Antalgic, Knees buckling Gait velocity: decreased     General Gait Details: Occasional instances of L knee buckling while walking but able to self-arrest with no LOB, verbal cues for L quad activation throughout gait. Slow cadence likely secondary to pain, but consistent and improved with cueing for quad activation.  Stairs Stairs: Yes Stairs assistance: Min guard Stair Management: One rail Left, With walker, Backwards Number of Stairs: 8 General stair comments: Pt educated on sequencing for stair navigation using either the rails to accommodate for home setup or going backwards with RW.  Pt used rail on L and step-to pattern for  4 steps. Pt then able to navigate 4 stairs with RW going up backwards. Min cueing at start for RW and LE sequencing.  Wheelchair Mobility    Modified Rankin (Stroke  Patients Only)       Balance Overall balance assessment: Needs assistance Sitting-balance support: Feet supported, No upper extremity supported Sitting balance-Leahy Scale: Normal     Standing balance support: Bilateral upper extremity supported, During functional activity Standing balance-Leahy Scale: Good Standing balance comment: Pt able to tolerate standing/walking with RW with no LOB, occasional instances of L knee buckling but able to self-arrest with no LOB.                             Pertinent Vitals/Pain Pain Assessment Pain Assessment: 0-10 Pain Score: 6  Pain Location: L knee Pain Intervention(s): Monitored during session, Premedicated before session, Repositioned    Home Living Family/patient expects to be discharged to:: Private residence Living Arrangements: Alone Available Help at Discharge: Family;Available PRN/intermittently Type of Home: House Home Access: Stairs to enter Entrance Stairs-Rails: Left (Only in back) Entrance Stairs-Number of Steps: 6 front, 3 back Alternate Level Stairs-Number of Steps: flight Home Layout: Two level;Bed/bath upstairs (Bathroom upstairs) Home Equipment: Cane - single point      Prior Function Prior Level of Function : Independent/Modified Independent             Mobility Comments: Community ambulator, no AD pre-surgery. Works as a Lawyer. ADLs Comments: Independent with all ADL's, IADL's     Hand Dominance   Dominant Hand: Right    Extremity/Trunk Assessment   Upper Extremity Assessment Upper Extremity Assessment: Overall WFL for tasks assessed    Lower Extremity Assessment Lower Extremity Assessment: Generalized weakness (expected post-op weakness)       Communication   Communication: No difficulties  Cognition Arousal/Alertness: Awake/alert Behavior During Therapy: WFL for tasks assessed/performed Overall Cognitive Status: Within Functional Limits for tasks assessed                                           General Comments      Exercises Total Joint Exercises Ankle Circles/Pumps: AROM, Both, 15 reps, Supine Quad Sets: Strengthening, Both, 10 reps, Supine Gluteal Sets: Strengthening, Both, 10 reps, Supine Short Arc Quad: AROM, Left, 10 reps, Supine Heel Slides: AROM, Left, 10 reps, Supine (With gentle overpressure) Hip ABduction/ADduction: AROM, Left, 10 reps, Supine (With manual resistance) Straight Leg Raises: AROM, Left, Supine, 10 reps (With manual resistance for last 5 reps) Goniometric ROM: 3-70 AAROM Other Exercises Other Exercises: Education on HEP sets/reps Other Exercises: Stair navigation using rails (L) and going backwards with RW   Assessment/Plan    PT Assessment Patient needs continued PT services  PT Problem List Decreased strength;Decreased range of motion;Decreased mobility       PT Treatment Interventions DME instruction;Therapeutic exercise;Stair training;Therapeutic activities;Patient/family education;Gait training    PT Goals (Current goals can be found in the Care Plan section)  Acute Rehab PT Goals Patient Stated Goal: Increase strength, walk better PT Goal Formulation: With patient Time For Goal Achievement: 05/29/23 Potential to Achieve Goals: Good    Frequency BID     Co-evaluation               AM-PAC PT "6 Clicks" Mobility  Outcome Measure Help needed turning from your back to your  side while in a flat bed without using bedrails?: None Help needed moving from lying on your back to sitting on the side of a flat bed without using bedrails?: None Help needed moving to and from a bed to a chair (including a wheelchair)?: None Help needed standing up from a chair using your arms (e.g., wheelchair or bedside chair)?: None Help needed to walk in hospital room?: A Little Help needed climbing 3-5 steps with a railing? : A Little 6 Click Score: 22    End of Session Equipment Utilized During Treatment: Gait  belt Activity Tolerance: Patient tolerated treatment well Patient left: in chair;with nursing/sitter in room;with call bell/phone within reach Nurse Communication: Mobility status;Weight bearing status PT Visit Diagnosis: Other abnormalities of gait and mobility (R26.89);Muscle weakness (generalized) (M62.81);Pain Pain - Right/Left: Left Pain - part of body: Knee    Time: 0815-0918 PT Time Calculation (min) (ACUTE ONLY): 63 min   Charges:              Cena Benton, SPT 05/16/23, 10:07 AM This entire session was performed under direct supervision and direction of a licensed therapist/therapist assistant. I have personally read, edited and approve of the note as written.

## 2023-05-23 ENCOUNTER — Other Ambulatory Visit (INDEPENDENT_AMBULATORY_CARE_PROVIDER_SITE_OTHER): Payer: Self-pay | Admitting: Vascular Surgery

## 2023-05-23 DIAGNOSIS — Z95828 Presence of other vascular implants and grafts: Secondary | ICD-10-CM

## 2023-05-23 DIAGNOSIS — Z86718 Personal history of other venous thrombosis and embolism: Secondary | ICD-10-CM

## 2023-05-29 ENCOUNTER — Ambulatory Visit (INDEPENDENT_AMBULATORY_CARE_PROVIDER_SITE_OTHER): Payer: BC Managed Care – PPO

## 2023-05-29 ENCOUNTER — Ambulatory Visit (INDEPENDENT_AMBULATORY_CARE_PROVIDER_SITE_OTHER): Payer: BC Managed Care – PPO | Admitting: Nurse Practitioner

## 2023-05-29 ENCOUNTER — Encounter (INDEPENDENT_AMBULATORY_CARE_PROVIDER_SITE_OTHER): Payer: Self-pay | Admitting: Nurse Practitioner

## 2023-05-29 VITALS — BP 116/75 | HR 65 | Resp 18 | Ht 65.0 in | Wt 182.6 lb

## 2023-05-29 DIAGNOSIS — Z86718 Personal history of other venous thrombosis and embolism: Secondary | ICD-10-CM | POA: Diagnosis not present

## 2023-05-29 DIAGNOSIS — Z95828 Presence of other vascular implants and grafts: Secondary | ICD-10-CM

## 2023-05-29 DIAGNOSIS — E119 Type 2 diabetes mellitus without complications: Secondary | ICD-10-CM | POA: Diagnosis not present

## 2023-05-29 DIAGNOSIS — I10 Essential (primary) hypertension: Secondary | ICD-10-CM | POA: Diagnosis not present

## 2023-05-30 NOTE — Progress Notes (Signed)
Subjective:    Patient ID: Erica Powers, female    DOB: 23-Apr-1961, 62 y.o.   MRN: [id number] Chief Complaint  Patient presents with   Follow-up    Room 3 6 week follow up with DVT    Erica Powers is a 62 year old female who presents today after recent left knee replacement surgery on 05/15/2023.  She is just began physical therapy and is overall doing well.  Due to her history of pulmonary embolism she had an IVC filter placed on 04/15/2023.  She notes that she has some swelling but is not more significant than when she previously had her right knee replaced.  Overall she feels she is progressing fairly well.  Today the patient had bilateral DVT studies done.  No evidence of DVT noted bilaterally.  On the left lower extremity the was a left distal thigh hematoma shown medial anteriorly to the knee measuring 3.49 cm x 1.94 cm.    Review of Systems  Cardiovascular:  Positive for leg swelling.  All other systems reviewed and are negative.      Objective:   Physical Exam Vitals reviewed.  HENT:     Head: Normocephalic.  Cardiovascular:     Rate and Rhythm: Normal rate.  Pulmonary:     Effort: Pulmonary effort is normal.  Musculoskeletal:     Left lower leg: Edema present.  Skin:    General: Skin is warm and dry.  Neurological:     Mental Status: She is alert and oriented to person, place, and time.     Gait: Gait abnormal.  Psychiatric:        Mood and Affect: Mood normal.        Behavior: Behavior normal.        Thought Content: Thought content normal.        Judgment: Judgment normal.     BP 116/75 (BP Location: Right Arm)   Pulse 65   Resp 18   Ht 5\' 5"  (1.651 m)   Wt 182 lb 9.6 oz (82.8 kg)   BMI 30.39 kg/m   Past Medical History:  Diagnosis Date   Acute right-sided low back pain    Anemia    Arthritis    Asthma    Complication of anesthesia    Woke during IVC filter removal   Diabetes mellitus without complication (HCC)    Difficult intubation     pt denies   DVT, lower extremity (HCC) 01/15/2015   right leg that went to lung   GERD (gastroesophageal reflux disease)    History of hiatal hernia    Hypertension    Osteoarthritis of lumbar spine, unspecified spinal osteoarthritis complication status    Pulmonary embolism (HCC) 01/15/2015   Rheumatoid factor positive    Sleep apnea     Social History   Socioeconomic History   Marital status: Divorced    Spouse name: Not on file   Number of children: 2   Years of education: Not on file   Highest education level: Some college, no degree  Occupational History   Not on file  Tobacco Use   Smoking status: Never   Smokeless tobacco: Never  Vaping Use   Vaping Use: Never used  Substance and Sexual Activity   Alcohol use: Not Currently   Drug use: No   Sexual activity: Not on file  Other Topics Concern   Not on file  Social History Narrative   Lives alone. Son, grand kids and sisters are support  persons.   Social Determinants of Health   Financial Resource Strain: Low Risk  (04/16/2019)   Overall Financial Resource Strain (CARDIA)    Difficulty of Paying Living Expenses: Not hard at all  Food Insecurity: No Food Insecurity (05/15/2023)   Hunger Vital Sign    Worried About Running Out of Food in the Last Year: Never true    Ran Out of Food in the Last Year: Never true  Transportation Needs: No Transportation Needs (05/15/2023)   PRAPARE - Administrator, Civil Service (Medical): No    Lack of Transportation (Non-Medical): No  Physical Activity: Not on file  Stress: No Stress Concern Present (04/16/2019)   [employer] of Occupational Health - Occupational Stress Questionnaire    Feeling of Stress : Only a little  Social Connections: Unknown (04/16/2019)   Social Connection and Isolation Panel [NHANES]    Frequency of Communication with Friends and Family: More than three times a week    Frequency of Social Gatherings with Friends and Family: Not on file     Attends Religious Services: Not on file    Active Member of Clubs or Organizations: Not on file    Attends Banker Meetings: Not on file    Marital Status: Not on file  Intimate Partner Violence: Not At Risk (05/15/2023)   Humiliation, Afraid, Rape, and Kick questionnaire    Fear of Current or Ex-Partner: No    Emotionally Abused: No    Physically Abused: No    Sexually Abused: No    Past Surgical History:  Procedure Laterality Date   BREAST BIOPSY Bilateral    [facility] done neg at [facility]    BREAST SURGERY  2000   Reduction   CESAREAN SECTION     CHOLECYSTECTOMY     COLONOSCOPY N/A 04/08/2015   Procedure: COLONOSCOPY;  Surgeon: Midge Minium, MD;  Location[facility];  Service: Gastroenterology;  Laterality: N/A;   COLONOSCOPY WITH PROPOFOL N/A 09/29/2020   Procedure: COLONOSCOPY WITH PROPOFOL;  Surgeon: Toney Reil, MD;  Location: [facility];  Service: Endoscopy;  Laterality: N/A;   ESOPHAGOGASTRODUODENOSCOPY (EGD) WITH PROPOFOL N/A 07/07/2015   Procedure: ESOPHAGOGASTRODUODENOSCOPY (EGD) WITH PROPOFOL;  Surgeon: Midge Minium, MD;  Location: [facility];  Service: Endoscopy;  Laterality: N/A;   ESOPHAGOGASTRODUODENOSCOPY (EGD) WITH PROPOFOL N/A 09/29/2020   Procedure: ESOPHAGOGASTRODUODENOSCOPY (EGD) WITH PROPOFOL;  Surgeon: Toney Reil, MD;  Location: [facility];  Service: Endoscopy;  Laterality: N/A;   GASTRIC ROUX-EN-Y N/A 11/01/2015   Procedure: LAPAROSCOPIC ROUX-EN-Y GASTRIC, HIATAL HERNIA REPAIR, EXTENSIVE LYSIS OF ADHESIONS;  Surgeon: Everette Rank, MD;  Location: [facility];  Service: General;  Laterality: N/A;   HEEL SPUR SURGERY Bilateral    IVC FILTER INSERTION N/A 01/19/2019   Procedure: IVC FILTER INSERTION;  Surgeon: Annice Needy, MD;  Location: [facility];  Service: Cardiovascular;  Laterality: N/A;   IVC FILTER INSERTION N/A 04/15/2023   Procedure: IVC FILTER INSERTION;  Surgeon: Annice Needy, MD;   Location: [facility];  Service: Cardiovascular;  Laterality: N/A;   IVC FILTER REMOVAL  04/16/2019   IVC FILTER REMOVAL N/A 04/16/2019   Procedure: IVC FILTER REMOVAL;  Surgeon: Annice Needy, MD;  Location: [facility];  Service: Cardiovascular;  Laterality: N/A;   KNEE ARTHROPLASTY Left 05/15/2023   Procedure: COMPUTER ASSISTED TOTAL KNEE ARTHROPLASTY;  Surgeon: Donato Heinz, MD;  Location: [facility];  Service: Orthopedics;  Laterality: Left;  REDUCTION MAMMAPLASTY Bilateral 2000   ROTATOR CUFF REPAIR Right 2009   TONSILLECTOMY     TOTAL KNEE ARTHROPLASTY Right 01/21/2019   Procedure: TOTAL KNEE ARTHROPLASTY with navigation;  Surgeon: Donato Heinz, MD;  Location: [facility];  Service: Orthopedics;  Laterality: Right;   TUBAL LIGATION      Family History  Problem Relation Age of Onset   Diabetes Mother    Hypertension Mother    Breast cancer Neg Hx     Allergies  Allergen Reactions   Codeine Itching   Hydrocodone Itching       Latest Ref Rng & Units 05/06/2023    3:12 PM 12/06/2020    3:55 PM 01/07/2019    8:46 AM  CBC  WBC 4.0 - 10.5 K/uL 5.2  7.0  5.1   Hemoglobin 12.0 - 15.0 g/dL 16.1  09.6  04.5   Hematocrit 36.0 - 46.0 % 35.7  34.6  37.6   Platelets 150 - 400 K/uL 299  261  279       CMP     Component Value Date/Time   NA 140 05/06/2023 1512   NA 140 05/04/2013 0842   K 3.8 05/06/2023 1512   K 3.6 05/04/2013 0842   CL 107 05/06/2023 1512   CL 107 05/04/2013 0842   CO2 25 05/06/2023 1512   CO2 30 05/04/2013 0842   GLUCOSE 84 05/06/2023 1512   GLUCOSE 100 (H) 05/04/2013 0842   BUN 17 05/06/2023 1512   BUN 12 05/04/2013 0842   CREATININE 0.72 05/06/2023 1512   CREATININE 0.79 05/04/2013 0842   CALCIUM 9.1 05/06/2023 1512   CALCIUM 8.9 05/04/2013 0842   PROT 7.2 05/06/2023 1512   PROT 8.1 04/09/2012 0011   ALBUMIN 4.1 05/06/2023 1512   ALBUMIN 3.4 04/09/2012 0011   AST 20 05/06/2023 1512   AST 40 (H) 04/09/2012 0011   ALT 12  05/06/2023 1512   ALT 38 04/09/2012 0011   ALKPHOS 87 05/06/2023 1512   ALKPHOS 98 04/09/2012 0011   BILITOT 0.7 05/06/2023 1512   BILITOT 0.5 04/09/2012 0011   GFRNONAA >60 05/06/2023 1512   GFRNONAA >60 05/04/2013 0842     No results found.     Assessment & Plan:   1. Type 2 diabetes mellitus without complication, without long-term current use of insulin (HCC) Continue hypoglycemic medications as already ordered, these medications have been reviewed and there are no changes at this time.  Hgb A1C to be monitored as already arranged by primary service  2. S/P IVC filter Today the patient follows up only 2 weeks from filter placement.  She is just not beginning to work with physical therapy.  Her studies today do show that there is a hematoma in the presence of her recent surgical site.  Typically we will plan on filter removal about 6 weeks after surgery and the patient's follow-up is a little on the sooner side.  Will have her return in 6 weeks in order to reevaluate to ensure that there have not been development of any DVT as well as to ensure that her hematoma has resolved.  We want to make sure this is resolved before IVC filter placement removal in case any possible revision may be necessary.  3. Essential (primary) hypertension Continue antihypertensive medications as already ordered, these medications have been reviewed and there are no changes at this time.   Current Outpatient Medications on File Prior to Visit  Medication Sig Dispense Refill   acetaminophen (TYLENOL)  500 MG tablet Take 500 mg by mouth every 6 (six) hours as needed.     albuterol (PROVENTIL HFA;VENTOLIN HFA) 108 (90 Base) MCG/ACT inhaler Inhale 1-2 puffs into the lungs every 4 (four) hours as needed for wheezing or shortness of breath.      azelastine (ASTELIN) 0.1 % nasal spray USE 1 TO 2 SPRAYS IN EACH NOSTRIL EVERY 12 HOURS AS NEEDED FOR ALLERGIES     baclofen (LIORESAL) 10 MG tablet Take 10 mg by mouth  3 (three) times daily.     budesonide-formoterol (SYMBICORT) 80-4.5 MCG/ACT inhaler Inhale 2 puffs into the lungs 2 (two) times daily as needed.     Calcium Carbonate-Vit D-Min (CALCIUM 600+D PLUS MINERALS) 600-400 MG-UNIT TABS Take by mouth.     celecoxib (CELEBREX) 200 MG capsule Take 1 capsule (200 mg total) by mouth 2 (two) times daily. 45 capsule 1   cyanocobalamin (,VITAMIN B-12,) 1000 MCG/ML injection Inject 1ml once a week for 4 weeks then once a month 8 mL 0   cyclobenzaprine (FLEXERIL) 5 MG tablet Take 1 tablet (5 mg total) by mouth 3 (three) times daily as needed. 15 tablet 0   enoxaparin (LOVENOX) 40 MG/0.4ML injection Inject 0.4 mLs (40 mg total) into the skin daily for 14 days. 5.6 mL 0   ferrous sulfate 325 (65 FE) MG EC tablet Take by mouth.     fluticasone (FLONASE) 50 MCG/ACT nasal spray Place 1 spray into both nostrils daily.     losartan (COZAAR) 25 MG tablet Take 25 mg by mouth daily.     Menthol, Topical Analgesic, (BIOFREEZE) 4 % GEL Apply 1 Application topically as needed. Left knee     montelukast (SINGULAIR) 10 MG tablet Take 10 mg by mouth at bedtime.     Multiple Vitamin (MULTI-VITAMIN) tablet Take by mouth.     Olopatadine HCl (PATADAY) 0.7 % SOLN Apply 1 drop to eye as needed. Both eyes as needed     oxyCODONE (OXY IR/ROXICODONE) 5 MG immediate release tablet Take 1 tablet (5 mg total) by mouth every 4 (four) hours as needed for moderate pain (pain score 4-6). 30 tablet 0   Semaglutide-Weight Management 2.4 MG/0.75ML SOAJ Inject 2.4 mg into the skin once a week.     traMADol (ULTRAM) 50 MG tablet Take 1-2 tablets (50-100 mg total) by mouth every 4 (four) hours as needed for moderate pain. 30 tablet 0   Vitamin D, Ergocalciferol, (DRISDOL) 1.25 MG (50000 UNIT) CAPS capsule TAKE 1 CAPSULE BY MOUTH EVERY 7 DAYS (Patient not taking: Reported on 05/29/2023) 12 capsule 0   No current facility-administered medications on file prior to visit.    There are no Patient  Instructions on file for this visit. No follow-ups on file.   Georgiana Spinner, NP

## 2023-07-08 ENCOUNTER — Other Ambulatory Visit (INDEPENDENT_AMBULATORY_CARE_PROVIDER_SITE_OTHER): Payer: Self-pay | Admitting: Nurse Practitioner

## 2023-07-08 DIAGNOSIS — S8012XD Contusion of left lower leg, subsequent encounter: Secondary | ICD-10-CM

## 2023-07-08 DIAGNOSIS — Z95828 Presence of other vascular implants and grafts: Secondary | ICD-10-CM

## 2023-07-10 ENCOUNTER — Ambulatory Visit (INDEPENDENT_AMBULATORY_CARE_PROVIDER_SITE_OTHER): Payer: BC Managed Care – PPO | Admitting: Nurse Practitioner

## 2023-07-10 ENCOUNTER — Encounter (INDEPENDENT_AMBULATORY_CARE_PROVIDER_SITE_OTHER): Payer: Self-pay | Admitting: Nurse Practitioner

## 2023-07-10 ENCOUNTER — Ambulatory Visit (INDEPENDENT_AMBULATORY_CARE_PROVIDER_SITE_OTHER): Payer: BC Managed Care – PPO

## 2023-07-10 VITALS — BP 117/72 | HR 71 | Resp 16 | Wt 182.2 lb

## 2023-07-10 DIAGNOSIS — Z95828 Presence of other vascular implants and grafts: Secondary | ICD-10-CM

## 2023-07-10 DIAGNOSIS — S8012XD Contusion of left lower leg, subsequent encounter: Secondary | ICD-10-CM

## 2023-07-10 DIAGNOSIS — E119 Type 2 diabetes mellitus without complications: Secondary | ICD-10-CM

## 2023-07-10 DIAGNOSIS — I10 Essential (primary) hypertension: Secondary | ICD-10-CM

## 2023-07-10 NOTE — Progress Notes (Signed)
Subjective:    Patient ID: Erica Powers, female    DOB: 09-16-1961, 62 y.o.   MRN: [id number] Chief Complaint  Patient presents with   Follow-up    Ultrasound follow up    Erica Powers is a 62 year old female who presents today after recent left knee replacement surgery on 05/15/2023.  She is just began physical therapy and is overall doing well.  Due to her history of pulmonary embolism she had an IVC filter placed on 04/15/2023.  Since her last visit the patient is walking much better.  The swelling that was in her knee has drastically improved.  She is currently no longer utilizing assistive devices and is ready to return to work shortly.  Noninvasive studies show presence of a seroma seen in the anterior lateral region but is smaller than previously measured.  No evidence of DVT or superficial phlebitis of the left lower extremity.    Review of Systems  Cardiovascular:  Positive for leg swelling.  All other systems reviewed and are negative.      Objective:   Physical Exam Vitals reviewed.  HENT:     Head: Normocephalic.  Cardiovascular:     Rate and Rhythm: Normal rate.  Pulmonary:     Effort: Pulmonary effort is normal.  Musculoskeletal:     Left lower leg: Edema present.  Skin:    General: Skin is warm and dry.  Neurological:     Mental Status: She is alert and oriented to person, place, and time.     Gait: Gait abnormal.  Psychiatric:        Mood and Affect: Mood normal.        Behavior: Behavior normal.        Thought Content: Thought content normal.        Judgment: Judgment normal.     BP 117/72 (BP Location: Left Arm)   Pulse 71   Resp 16   Wt 182 lb 3.2 oz (82.6 kg)   BMI 30.32 kg/m   Past Medical History:  Diagnosis Date   Acute right-sided low back pain    Anemia    Arthritis    Asthma    Complication of anesthesia    Woke during IVC filter removal   Diabetes mellitus without complication (HCC)    Difficult intubation    pt denies    DVT, lower extremity (HCC) 01/15/2015   right leg that went to lung   GERD (gastroesophageal reflux disease)    History of hiatal hernia    Hypertension    Osteoarthritis of lumbar spine, unspecified spinal osteoarthritis complication status    Pulmonary embolism (HCC) 01/15/2015   Rheumatoid factor positive    Sleep apnea     Social History   Socioeconomic History   Marital status: Divorced    Spouse name: Not on file   Number of children: 2   Years of education: Not on file   Highest education level: Some college, no degree  Occupational History   Not on file  Tobacco Use   Smoking status: Never   Smokeless tobacco: Never  Vaping Use   Vaping status: Never Used  Substance and Sexual Activity   Alcohol use: Not Currently   Drug use: No   Sexual activity: Not on file  Other Topics Concern   Not on file  Social History Narrative   Lives alone. Son, grand kids and sisters are support persons.   Social Determinants of Health   Financial Resource Strain:  Medium Risk (11/16/2019)   Received from [facility], [facility]   Overall Financial Resource Strain (CARDIA)    Difficulty of Paying Living Expenses: Somewhat hard  Food Insecurity: No Food Insecurity (05/15/2023)   Hunger Vital Sign    Worried About Running Out of Food in the Last Year: Never true    Ran Out of Food in the Last Year: Never true  Transportation Needs: No Transportation Needs (05/15/2023)   PRAPARE - Administrator, Civil Service (Medical): No    Lack of Transportation (Non-Medical): No  Physical Activity: Inactive (11/16/2019)   Received from [facility], [facility]   Exercise Vital Sign    Days of Exercise per Week: 0 days    Minutes of Exercise per Session: 0 min  Stress: No Stress Concern Present (11/16/2019)   Received from [facility], [facility] - Occupational Stress Questionnaire    Feeling of Stress : Not at  all  Social Connections: Unknown (11/16/2019)   Received from [facility], [facility]   Social Connection and Isolation Panel [NHANES]    Frequency of Communication with Friends and Family: More than three times a week    Frequency of Social Gatherings with Friends and Family: More than three times a week    Attends Religious Services: Not on file    Active Member of Clubs or Organizations: Yes    Attends Banker Meetings: Not on file    Marital Status: Divorced  Intimate Partner Violence: Not At Risk (05/15/2023)   Humiliation, Afraid, Rape, and Kick questionnaire    Fear of Current or Ex-Partner: No    Emotionally Abused: No    Physically Abused: No    Sexually Abused: No    Past Surgical History:  Procedure Laterality Date   BREAST BIOPSY Bilateral    [facility]    BREAST SURGERY  2000   Reduction   CESAREAN SECTION     CHOLECYSTECTOMY     COLONOSCOPY N/A 04/08/2015   Procedure: COLONOSCOPY;  Surgeon: Midge Minium, MD;  Location: [facility];  Service: Gastroenterology;  Laterality: N/A;   COLONOSCOPY WITH PROPOFOL N/A 09/29/2020   Procedure: COLONOSCOPY WITH PROPOFOL;  Surgeon: Toney Reil, MD;  Location: [facility];  Service: Endoscopy;  Laterality: N/A;   ESOPHAGOGASTRODUODENOSCOPY (EGD) WITH PROPOFOL N/A 07/07/2015   Procedure: ESOPHAGOGASTRODUODENOSCOPY (EGD) WITH PROPOFOL;  Surgeon: Midge Minium, MD;  Location: [facility];  Service: Endoscopy;  Laterality: N/A;   ESOPHAGOGASTRODUODENOSCOPY (EGD) WITH PROPOFOL N/A 09/29/2020   Procedure: ESOPHAGOGASTRODUODENOSCOPY (EGD) WITH PROPOFOL;  Surgeon: Toney Reil, MD;  Location: [facility];  Service: Endoscopy;  Laterality: N/A;   GASTRIC ROUX-EN-Y N/A 11/01/2015   Procedure: LAPAROSCOPIC ROUX-EN-Y GASTRIC, HIATAL HERNIA REPAIR, EXTENSIVE LYSIS OF ADHESIONS;  Surgeon: Everette Rank, MD;  Location: [facility];  Service: General;  Laterality: N/A;    HEEL SPUR SURGERY Bilateral    IVC FILTER INSERTION N/A 01/19/2019   Procedure: IVC FILTER INSERTION;  Surgeon: Annice Needy, MD;  Location: [facility];  Service: Cardiovascular;  Laterality: N/A;   IVC FILTER INSERTION N/A 04/15/2023   Procedure: IVC FILTER INSERTION;  Surgeon: Annice Needy, MD;  Location: [facility];  Service: Cardiovascular;  Laterality: N/A;   IVC FILTER REMOVAL  04/16/2019   IVC FILTER REMOVAL N/A 04/16/2019   Procedure: IVC FILTER REMOVAL;  Surgeon: Annice Needy, MD;  Location: [facility];  Service: Cardiovascular;  Laterality: N/A;   KNEE ARTHROPLASTY Left 05/15/2023   Procedure: COMPUTER ASSISTED TOTAL KNEE ARTHROPLASTY;  Surgeon: Donato Heinz, MD;  Location: [facility];  Service: Orthopedics;  Laterality: Left;   REDUCTION MAMMAPLASTY Bilateral 2000   ROTATOR CUFF REPAIR Right 2009   TONSILLECTOMY     TOTAL KNEE ARTHROPLASTY Right 01/21/2019   Procedure: TOTAL KNEE ARTHROPLASTY with navigation;  Surgeon: Donato Heinz, MD;  Location: [facility];  Service: Orthopedics;  Laterality: Right;   TUBAL LIGATION      Family History  Problem Relation Age of Onset   Diabetes Mother    Hypertension Mother    Breast cancer Neg Hx     Allergies  Allergen Reactions   Codeine Itching   Hydrocodone Itching       Latest Ref Rng & Units 05/06/2023    3:12 PM 12/06/2020    3:55 PM 01/07/2019    8:46 AM  CBC  WBC 4.0 - 10.5 K/uL 5.2  7.0  5.1   Hemoglobin 12.0 - 15.0 g/dL 41.6  60.6  30.1   Hematocrit 36.0 - 46.0 % 35.7  34.6  37.6   Platelets 150 - 400 K/uL 299  261  279       CMP     Component Value Date/Time   NA 140 05/06/2023 1512   NA 140 05/04/2013 0842   K 3.8 05/06/2023 1512   K 3.6 05/04/2013 0842   CL 107 05/06/2023 1512   CL 107 05/04/2013 0842   CO2 25 05/06/2023 1512   CO2 30 05/04/2013 0842   GLUCOSE 84 05/06/2023 1512   GLUCOSE 100 (H) 05/04/2013 0842   BUN 17 05/06/2023 1512   BUN 12 05/04/2013 0842    CREATININE 0.72 05/06/2023 1512   CREATININE 0.79 05/04/2013 0842   CALCIUM 9.1 05/06/2023 1512   CALCIUM 8.9 05/04/2013 0842   PROT 7.2 05/06/2023 1512   PROT 8.1 04/09/2012 0011   ALBUMIN 4.1 05/06/2023 1512   ALBUMIN 3.4 04/09/2012 0011   AST 20 05/06/2023 1512   AST 40 (H) 04/09/2012 0011   ALT 12 05/06/2023 1512   ALT 38 04/09/2012 0011   ALKPHOS 87 05/06/2023 1512   ALKPHOS 98 04/09/2012 0011   BILITOT 0.7 05/06/2023 1512   BILITOT 0.5 04/09/2012 0011   GFRNONAA >60 05/06/2023 1512   GFRNONAA >60 05/04/2013 0842     No results found.     Assessment & Plan:   1. Type 2 diabetes mellitus without complication, without long-term current use of insulin (HCC) Continue hypoglycemic medications as already ordered, these medications have been reviewed and there are no changes at this time.  Hgb A1C to be monitored as already arranged by primary service  2. S/P IVC filter The patient has done well status post joint replacement surgery.  Therefore, I recommend that we remove the IVC filter.  Risk and benefits were reviewed all questions answered patient has agreed to proceed.  Patient will continue to elevate to minimize swelling.  Given the history of DVT and the possibility of post phlebitic changes such as swelling and discomfort the patient should wear graduated compression stockings.  The compression should be worn on a daily basis.  In addition, behavioral modification including elevation during the day and avoidance of prolonged dependency is helpful.    The patient will follow-up with me after the filter removal.  3. Essential (primary) hypertension Continue  antihypertensive medications as already ordered, these medications have been reviewed and there are no changes at this time.   Current Outpatient Medications on File Prior to Visit  Medication Sig Dispense Refill   acetaminophen (TYLENOL) 500 MG tablet Take 500 mg by mouth every 6 (six) hours as needed.      albuterol (PROVENTIL HFA;VENTOLIN HFA) 108 (90 Base) MCG/ACT inhaler Inhale 1-2 puffs into the lungs every 4 (four) hours as needed for wheezing or shortness of breath.      azelastine (ASTELIN) 0.1 % nasal spray USE 1 TO 2 SPRAYS IN EACH NOSTRIL EVERY 12 HOURS AS NEEDED FOR ALLERGIES     baclofen (LIORESAL) 10 MG tablet Take 10 mg by mouth 3 (three) times daily.     budesonide-formoterol (SYMBICORT) 80-4.5 MCG/ACT inhaler Inhale 2 puffs into the lungs 2 (two) times daily as needed.     Calcium Carbonate-Vit D-Min (CALCIUM 600+D PLUS MINERALS) 600-400 MG-UNIT TABS Take by mouth.     celecoxib (CELEBREX) 200 MG capsule Take 1 capsule (200 mg total) by mouth 2 (two) times daily. 45 capsule 1   cyanocobalamin (,VITAMIN B-12,) 1000 MCG/ML injection Inject 1ml once a week for 4 weeks then once a month 8 mL 0   cyclobenzaprine (FLEXERIL) 5 MG tablet Take 1 tablet (5 mg total) by mouth 3 (three) times daily as needed. 15 tablet 0   ferrous sulfate 325 (65 FE) MG EC tablet Take by mouth.     fluticasone (FLONASE) 50 MCG/ACT nasal spray Place 1 spray into both nostrils daily.     losartan (COZAAR) 25 MG tablet Take 25 mg by mouth daily.     Menthol, Topical Analgesic, (BIOFREEZE) 4 % GEL Apply 1 Application topically as needed. Left knee     montelukast (SINGULAIR) 10 MG tablet Take 10 mg by mouth at bedtime.     Multiple Vitamin (MULTI-VITAMIN) tablet Take by mouth.     Olopatadine HCl (PATADAY) 0.7 % SOLN Apply 1 drop to eye as needed. Both eyes as needed     oxyCODONE (OXY IR/ROXICODONE) 5 MG immediate release tablet Take 1 tablet (5 mg total) by mouth every 4 (four) hours as needed for moderate pain (pain score 4-6). 30 tablet 0   Semaglutide-Weight Management 2.4 MG/0.75ML SOAJ Inject 2.4 mg into the skin once a week.     traMADol (ULTRAM) 50 MG tablet Take 1-2 tablets (50-100 mg total) by mouth every 4 (four) hours as needed for moderate pain. 30 tablet 0   enoxaparin (LOVENOX) 40 MG/0.4ML injection  Inject 0.4 mLs (40 mg total) into the skin daily for 14 days. 5.6 mL 0   Vitamin D, Ergocalciferol, (DRISDOL) 1.25 MG (50000 UNIT) CAPS capsule TAKE 1 CAPSULE BY MOUTH EVERY 7 DAYS (Patient not taking: Reported on 05/29/2023) 12 capsule 0   No current facility-administered medications on file prior to visit.    There are no Patient Instructions on file for this visit. No follow-ups on file.   Georgiana Spinner, NP

## 2023-07-10 NOTE — H&P (View-Only) (Signed)
 Subjective:    Patient ID: Erica Powers, female    DOB: 09-16-1961, 62 y.o.   MRN: [id number] Chief Complaint  Patient presents with   Follow-up    Ultrasound follow up    Erica Powers is a 62 year old female who presents today after recent left knee replacement surgery on 05/15/2023.  She is just began physical therapy and is overall doing well.  Due to her history of pulmonary embolism she had an IVC filter placed on 04/15/2023.  Since her last visit the patient is walking much better.  The swelling that was in her knee has drastically improved.  She is currently no longer utilizing assistive devices and is ready to return to work shortly.  Noninvasive studies show presence of a seroma seen in the anterior lateral region but is smaller than previously measured.  No evidence of DVT or superficial phlebitis of the left lower extremity.    Review of Systems  Cardiovascular:  Positive for leg swelling.  All other systems reviewed and are negative.      Objective:   Physical Exam Vitals reviewed.  HENT:     Head: Normocephalic.  Cardiovascular:     Rate and Rhythm: Normal rate.  Pulmonary:     Effort: Pulmonary effort is normal.  Musculoskeletal:     Left lower leg: Edema present.  Skin:    General: Skin is warm and dry.  Neurological:     Mental Status: She is alert and oriented to person, place, and time.     Gait: Gait abnormal.  Psychiatric:        Mood and Affect: Mood normal.        Behavior: Behavior normal.        Thought Content: Thought content normal.        Judgment: Judgment normal.     BP 117/72 (BP Location: Left Arm)   Pulse 71   Resp 16   Wt 182 lb 3.2 oz (82.6 kg)   BMI 30.32 kg/m   Past Medical History:  Diagnosis Date   Acute right-sided low back pain    Anemia    Arthritis    Asthma    Complication of anesthesia    Woke during IVC filter removal   Diabetes mellitus without complication (HCC)    Difficult intubation    pt denies    DVT, lower extremity (HCC) 01/15/2015   right leg that went to lung   GERD (gastroesophageal reflux disease)    History of hiatal hernia    Hypertension    Osteoarthritis of lumbar spine, unspecified spinal osteoarthritis complication status    Pulmonary embolism (HCC) 01/15/2015   Rheumatoid factor positive    Sleep apnea     Social History   Socioeconomic History   Marital status: Divorced    Spouse name: Not on file   Number of children: 2   Years of education: Not on file   Highest education level: Some college, no degree  Occupational History   Not on file  Tobacco Use   Smoking status: Never   Smokeless tobacco: Never  Vaping Use   Vaping status: Never Used  Substance and Sexual Activity   Alcohol use: Not Currently   Drug use: No   Sexual activity: Not on file  Other Topics Concern   Not on file  Social History Narrative   Lives alone. Son, grand kids and sisters are support persons.   Social Determinants of Health   Financial Resource Strain:  Medium Risk (11/16/2019)   Received from [facility]   Overall Financial Resource Strain (CARDIA)    Difficulty of Paying Living Expenses: Somewhat hard  Food Insecurity: No Food Insecurity (05/15/2023)   Hunger Vital Sign    Worried About Running Out of Food in the Last Year: Never true    Ran Out of Food in the Last Year: Never true  Transportation Needs: No Transportation Needs (05/15/2023)   PRAPARE - Administrator, Civil Service (Medical): No    Lack of Transportation (Non-Medical): No  Physical Activity: Inactive (11/16/2019)   Received from [facility], [facility]   Exercise Vital Sign    Days of Exercise per Week: 0 days    Minutes of Exercise per Session: 0 min  Stress: No Stress Concern Present (11/16/2019)   Received from [facility], [facility] - Occupational Stress Questionnaire    Feeling of Stress : Not at  all  Social Connections: Unknown (11/16/2019)   Received from [facility], [facility]   Social Connection and Isolation Panel [NHANES]    Frequency of Communication with Friends and Family: More than three times a week    Frequency of Social Gatherings with Friends and Family: More than three times a week    Attends Religious Services: Not on file    Active Member of Clubs or Organizations: Yes    Attends Banker Meetings: Not on file    Marital Status: Divorced  Intimate Partner Violence: Not At Risk (05/15/2023)   Humiliation, Afraid, Rape, and Kick questionnaire    Fear of Current or Ex-Partner: No    Emotionally Abused: No    Physically Abused: No    Sexually Abused: No    Past Surgical History:  Procedure Laterality Date   BREAST BIOPSY Bilateral    [facility]    BREAST SURGERY  2000   Reduction   CESAREAN SECTION     CHOLECYSTECTOMY     COLONOSCOPY N/A 04/08/2015   Procedure: COLONOSCOPY;  Surgeon: Midge Minium, MD;  Location: [facility];  Service: Gastroenterology;  Laterality: N/A;   COLONOSCOPY WITH PROPOFOL N/A 09/29/2020   Procedure: COLONOSCOPY WITH PROPOFOL;  Surgeon: Toney Reil, MD;  Location: [facility];  Service: Endoscopy;  Laterality: N/A;   ESOPHAGOGASTRODUODENOSCOPY (EGD) WITH PROPOFOL N/A 07/07/2015   Procedure: ESOPHAGOGASTRODUODENOSCOPY (EGD) WITH PROPOFOL;  Surgeon: Midge Minium, MD;  Location: [facility];  Service: Endoscopy;  Laterality: N/A;   ESOPHAGOGASTRODUODENOSCOPY (EGD) WITH PROPOFOL N/A 09/29/2020   Procedure: ESOPHAGOGASTRODUODENOSCOPY (EGD) WITH PROPOFOL;  Surgeon: Toney Reil, MD;  Location: [facility];  Service: Endoscopy;  Laterality: N/A;   GASTRIC ROUX-EN-Y N/A 11/01/2015   Procedure: LAPAROSCOPIC ROUX-EN-Y GASTRIC, HIATAL HERNIA REPAIR, EXTENSIVE LYSIS OF ADHESIONS;  Surgeon: Everette Rank, MD;  Location: [facility];  Service: General;  Laterality: N/A;    HEEL SPUR SURGERY Bilateral    IVC FILTER INSERTION N/A 01/19/2019   Procedure: IVC FILTER INSERTION;  Surgeon: Annice Needy, MD;  Location: [facility];  Service: Cardiovascular;  Laterality: N/A;   IVC FILTER INSERTION N/A 04/15/2023   Procedure: IVC FILTER INSERTION;  Surgeon: Annice Needy, MD;  Location: [facility];  Service: Cardiovascular;  Laterality: N/A;   IVC FILTER REMOVAL  04/16/2019   IVC FILTER REMOVAL N/A 04/16/2019   Procedure: IVC FILTER REMOVAL;  Surgeon: Annice Needy, MD;  Location: [facility];  Service: Cardiovascular;  Laterality: N/A;   KNEE ARTHROPLASTY Left 05/15/2023   Procedure: COMPUTER ASSISTED TOTAL KNEE ARTHROPLASTY;  Surgeon: Donato Heinz, MD;  Location: [facility];  Service: Orthopedics;  Laterality: Left;   REDUCTION MAMMAPLASTY Bilateral 2000   ROTATOR CUFF REPAIR Right 2009   TONSILLECTOMY     TOTAL KNEE ARTHROPLASTY Right 01/21/2019   Procedure: TOTAL KNEE ARTHROPLASTY with navigation;  Surgeon: Donato Heinz, MD;  Location: [facility];  Service: Orthopedics;  Laterality: Right;   TUBAL LIGATION      Family History  Problem Relation Age of Onset   Diabetes Mother    Hypertension Mother    Breast cancer Neg Hx     Allergies  Allergen Reactions   Codeine Itching   Hydrocodone Itching       Latest Ref Rng & Units 05/06/2023    3:12 PM 12/06/2020    3:55 PM 01/07/2019    8:46 AM  CBC  WBC 4.0 - 10.5 K/uL 5.2  7.0  5.1   Hemoglobin 12.0 - 15.0 g/dL 41.6  60.6  30.1   Hematocrit 36.0 - 46.0 % 35.7  34.6  37.6   Platelets 150 - 400 K/uL 299  261  279       CMP     Component Value Date/Time   NA 140 05/06/2023 1512   NA 140 05/04/2013 0842   K 3.8 05/06/2023 1512   K 3.6 05/04/2013 0842   CL 107 05/06/2023 1512   CL 107 05/04/2013 0842   CO2 25 05/06/2023 1512   CO2 30 05/04/2013 0842   GLUCOSE 84 05/06/2023 1512   GLUCOSE 100 (H) 05/04/2013 0842   BUN 17 05/06/2023 1512   BUN 12 05/04/2013 0842    CREATININE 0.72 05/06/2023 1512   CREATININE 0.79 05/04/2013 0842   CALCIUM 9.1 05/06/2023 1512   CALCIUM 8.9 05/04/2013 0842   PROT 7.2 05/06/2023 1512   PROT 8.1 04/09/2012 0011   ALBUMIN 4.1 05/06/2023 1512   ALBUMIN 3.4 04/09/2012 0011   AST 20 05/06/2023 1512   AST 40 (H) 04/09/2012 0011   ALT 12 05/06/2023 1512   ALT 38 04/09/2012 0011   ALKPHOS 87 05/06/2023 1512   ALKPHOS 98 04/09/2012 0011   BILITOT 0.7 05/06/2023 1512   BILITOT 0.5 04/09/2012 0011   GFRNONAA >60 05/06/2023 1512   GFRNONAA >60 05/04/2013 0842     No results found.     Assessment & Plan:   1. Type 2 diabetes mellitus without complication, without long-term current use of insulin (HCC) Continue hypoglycemic medications as already ordered, these medications have been reviewed and there are no changes at this time.  Hgb A1C to be monitored as already arranged by primary service  2. S/P IVC filter The patient has done well status post joint replacement surgery.  Therefore, I recommend that we remove the IVC filter.  Risk and benefits were reviewed all questions answered patient has agreed to proceed.  Patient will continue to elevate to minimize swelling.  Given the history of DVT and the possibility of post phlebitic changes such as swelling and discomfort the patient should wear graduated compression stockings.  The compression should be worn on a daily basis.  In addition, behavioral modification including elevation during the day and avoidance of prolonged dependency is helpful.    The patient will follow-up with me after the filter removal.  3. Essential (primary) hypertension Continue  antihypertensive medications as already ordered, these medications have been reviewed and there are no changes at this time.   Current Outpatient Medications on File Prior to Visit  Medication Sig Dispense Refill   acetaminophen (TYLENOL) 500 MG tablet Take 500 mg by mouth every 6 (six) hours as needed.      albuterol (PROVENTIL HFA;VENTOLIN HFA) 108 (90 Base) MCG/ACT inhaler Inhale 1-2 puffs into the lungs every 4 (four) hours as needed for wheezing or shortness of breath.      azelastine (ASTELIN) 0.1 % nasal spray USE 1 TO 2 SPRAYS IN EACH NOSTRIL EVERY 12 HOURS AS NEEDED FOR ALLERGIES     baclofen (LIORESAL) 10 MG tablet Take 10 mg by mouth 3 (three) times daily.     budesonide-formoterol (SYMBICORT) 80-4.5 MCG/ACT inhaler Inhale 2 puffs into the lungs 2 (two) times daily as needed.     Calcium Carbonate-Vit D-Min (CALCIUM 600+D PLUS MINERALS) 600-400 MG-UNIT TABS Take by mouth.     celecoxib (CELEBREX) 200 MG capsule Take 1 capsule (200 mg total) by mouth 2 (two) times daily. 45 capsule 1   cyanocobalamin (,VITAMIN B-12,) 1000 MCG/ML injection Inject 1ml once a week for 4 weeks then once a month 8 mL 0   cyclobenzaprine (FLEXERIL) 5 MG tablet Take 1 tablet (5 mg total) by mouth 3 (three) times daily as needed. 15 tablet 0   ferrous sulfate 325 (65 FE) MG EC tablet Take by mouth.     fluticasone (FLONASE) 50 MCG/ACT nasal spray Place 1 spray into both nostrils daily.     losartan (COZAAR) 25 MG tablet Take 25 mg by mouth daily.     Menthol, Topical Analgesic, (BIOFREEZE) 4 % GEL Apply 1 Application topically as needed. Left knee     montelukast (SINGULAIR) 10 MG tablet Take 10 mg by mouth at bedtime.     Multiple Vitamin (MULTI-VITAMIN) tablet Take by mouth.     Olopatadine HCl (PATADAY) 0.7 % SOLN Apply 1 drop to eye as needed. Both eyes as needed     oxyCODONE (OXY IR/ROXICODONE) 5 MG immediate release tablet Take 1 tablet (5 mg total) by mouth every 4 (four) hours as needed for moderate pain (pain score 4-6). 30 tablet 0   Semaglutide-Weight Management 2.4 MG/0.75ML SOAJ Inject 2.4 mg into the skin once a week.     traMADol (ULTRAM) 50 MG tablet Take 1-2 tablets (50-100 mg total) by mouth every 4 (four) hours as needed for moderate pain. 30 tablet 0   enoxaparin (LOVENOX) 40 MG/0.4ML injection  Inject 0.4 mLs (40 mg total) into the skin daily for 14 days. 5.6 mL 0   Vitamin D, Ergocalciferol, (DRISDOL) 1.25 MG (50000 UNIT) CAPS capsule TAKE 1 CAPSULE BY MOUTH EVERY 7 DAYS (Patient not taking: Reported on 05/29/2023) 12 capsule 0   No current facility-administered medications on file prior to visit.    There are no Patient Instructions on file for this visit. No follow-ups on file.   Georgiana Spinner, NP

## 2023-07-15 ENCOUNTER — Telehealth (INDEPENDENT_AMBULATORY_CARE_PROVIDER_SITE_OTHER): Payer: Self-pay

## 2023-07-15 NOTE — Telephone Encounter (Signed)
Spoke with the patient and she is scheduled on 07/29/23 for a IVC filter removal with Dr. [name] and the [facility] with a 6:45 am arrival time. Pre-procedure instructions were discussed and will be sent to Mychart and mailed. Patient was offered 07/22/23 and declined.

## 2023-07-29 ENCOUNTER — Other Ambulatory Visit: Payer: Self-pay

## 2023-07-29 ENCOUNTER — Encounter: Payer: Self-pay | Admitting: Vascular Surgery

## 2023-07-29 ENCOUNTER — Ambulatory Visit
Admission: RE | Admit: 2023-07-29 | Discharge: 2023-07-29 | Disposition: A | Discharge status: Home / Self Care | Payer: BC Managed Care – PPO | Attending: Vascular Surgery | Admitting: Vascular Surgery

## 2023-07-29 ENCOUNTER — Encounter
Admission: RE | Disposition: A | Discharge status: Home / Self Care | Payer: Self-pay | Source: Home / Self Care | Attending: Vascular Surgery

## 2023-07-29 DIAGNOSIS — I2699 Other pulmonary embolism without acute cor pulmonale: Secondary | ICD-10-CM

## 2023-07-29 DIAGNOSIS — Z7901 Long term (current) use of anticoagulants: Secondary | ICD-10-CM

## 2023-07-29 DIAGNOSIS — I10 Essential (primary) hypertension: Secondary | ICD-10-CM | POA: Diagnosis not present

## 2023-07-29 DIAGNOSIS — Z96653 Presence of artificial knee joint, bilateral: Secondary | ICD-10-CM | POA: Insufficient documentation

## 2023-07-29 DIAGNOSIS — Z86711 Personal history of pulmonary embolism: Secondary | ICD-10-CM | POA: Diagnosis not present

## 2023-07-29 DIAGNOSIS — Z4589 Encounter for adjustment and management of other implanted devices: Secondary | ICD-10-CM | POA: Diagnosis present

## 2023-07-29 DIAGNOSIS — E119 Type 2 diabetes mellitus without complications: Secondary | ICD-10-CM | POA: Insufficient documentation

## 2023-07-29 DIAGNOSIS — Z86718 Personal history of other venous thrombosis and embolism: Secondary | ICD-10-CM

## 2023-07-29 HISTORY — PX: IVC FILTER REMOVAL: CATH118246

## 2023-07-29 SURGERY — IVC FILTER REMOVAL
Anesthesia: Moderate Sedation

## 2023-07-29 MED ORDER — METHYLPREDNISOLONE SODIUM SUCC 125 MG IJ SOLR: 125.0000 mg | Freq: Once | INTRAMUSCULAR | Status: DC | PRN

## 2023-07-29 MED ORDER — FENTANYL CITRATE (PF) 100 MCG/2ML IJ SOLN
INTRAMUSCULAR | Status: DC | PRN
  Administered 2023-07-29: 25 ug via INTRAVENOUS
  Administered 2023-07-29: 50 ug via INTRAVENOUS
  Administered 2023-07-29: 25 ug via INTRAVENOUS

## 2023-07-29 MED ORDER — CEFAZOLIN SODIUM-DEXTROSE 2-4 GM/100ML-% IV SOLN
2.0000 g | INTRAVENOUS | Status: AC
  Administered 2023-07-29: 2 g via INTRAVENOUS

## 2023-07-29 MED ORDER — MIDAZOLAM HCL 2 MG/2ML IJ SOLN
INTRAMUSCULAR | Status: AC
  Filled 2023-07-29: qty 2

## 2023-07-29 MED ORDER — IODIXANOL 320 MG/ML IV SOLN
INTRAVENOUS | Status: DC | PRN
  Administered 2023-07-29: 15 mL via INTRAVENOUS

## 2023-07-29 MED ORDER — HEPARIN (PORCINE) IN NACL 1000-0.9 UT/500ML-% IV SOLN
INTRAVENOUS | Status: DC | PRN
  Administered 2023-07-29: 500 mL

## 2023-07-29 MED ORDER — ONDANSETRON HCL 4 MG/2ML IJ SOLN: 4.0000 mg | Freq: Four times a day (QID) | INTRAMUSCULAR | Status: DC | PRN

## 2023-07-29 MED ORDER — CEFAZOLIN SODIUM-DEXTROSE 2-4 GM/100ML-% IV SOLN
INTRAVENOUS | Status: AC
  Filled 2023-07-29: qty 100

## 2023-07-29 MED ORDER — LIDOCAINE-EPINEPHRINE (PF) 1 %-1:200000 IJ SOLN
INTRAMUSCULAR | Status: DC | PRN
  Administered 2023-07-29: 10 mL via INTRADERMAL

## 2023-07-29 MED ORDER — HEPARIN SODIUM (PORCINE) 1000 UNIT/ML IJ SOLN
INTRAMUSCULAR | Status: AC
  Filled 2023-07-29: qty 10

## 2023-07-29 MED ORDER — DIPHENHYDRAMINE HCL 50 MG/ML IJ SOLN: 50.0000 mg | Freq: Once | INTRAMUSCULAR | Status: DC | PRN

## 2023-07-29 MED ORDER — HYDROMORPHONE HCL 1 MG/ML IJ SOLN: 1.0000 mg | Freq: Once | INTRAMUSCULAR | Status: DC | PRN

## 2023-07-29 MED ORDER — FAMOTIDINE 20 MG PO TABS: 40.0000 mg | ORAL_TABLET | Freq: Once | ORAL | Status: DC | PRN

## 2023-07-29 MED ORDER — MIDAZOLAM HCL 2 MG/2ML IJ SOLN
INTRAMUSCULAR | Status: DC | PRN
  Administered 2023-07-29: 2 mg via INTRAVENOUS
  Administered 2023-07-29 (×2): 1 mg via INTRAVENOUS

## 2023-07-29 MED ORDER — FENTANYL CITRATE (PF) 100 MCG/2ML IJ SOLN
INTRAMUSCULAR | Status: AC
  Filled 2023-07-29: qty 2

## 2023-07-29 MED ORDER — SODIUM CHLORIDE 0.9 % IV SOLN: INTRAVENOUS | Status: DC

## 2023-07-29 MED ORDER — MIDAZOLAM HCL 2 MG/ML PO SYRP: 8.0000 mg | ORAL_SOLUTION | Freq: Once | ORAL | Status: DC | PRN

## 2023-07-29 SURGICAL SUPPLY — 6 items
GLIDEWIRE ADV .035X180CM (WIRE) IMPLANT
PACK ANGIOGRAPHY (CUSTOM PROCEDURE TRAY) ×1 IMPLANT
SET CLOVERSNARE FLT RETRIEVAL (MISCELLANEOUS) IMPLANT
SET VENACAVA FILTER RETRIEVAL (MISCELLANEOUS) IMPLANT
SHEATH 12FRX45 (SHEATH) IMPLANT
WIRE GUIDERIGHT .035X150 (WIRE) IMPLANT

## 2023-07-29 NOTE — Interval H&P Note (Signed)
History and Physical Interval Note:  07/29/2023 8:12 AM  Erica Powers  has presented today for surgery, with the diagnosis of IVC filter removal   Pulmonary embolism.  The various methods of treatment have been discussed with the patient and family. After consideration of risks, benefits and other options for treatment, the patient has consented to  Procedure(s): IVC FILTER REMOVAL (N/A) as a surgical intervention.  The patient's history has been reviewed, patient examined, no change in status, stable for surgery.  I have reviewed the patient's chart and labs.  Questions were answered to the patient's satisfaction.     Festus Barren

## 2023-07-29 NOTE — Op Note (Signed)
[facility]   OPERATIVE NOTE    PRE-OPERATIVE DIAGNOSIS:  1. DVT 2. status post IVC filter placement  POST-OPERATIVE DIAGNOSIS: Same as above  PROCEDURE: 1.   Ultrasound guidance for vascular access right jugular vein 2.   Catheter placement into inferior vena cava from right jugular vein 3.   Inferior venacavogram 4.   Retrieval of Option Elite IVC filter  SURGEON: Festus Barren, MD  ASSISTANT(S): None  ANESTHESIA: Local with moderate conscious sedation for approximately 33 minutes using 4 mg of Versed and 100 mcg of Fentanyl  ESTIMATED BLOOD LOSS: 10 cc  CONTRAST:  15 cc  FLUORO TIME:  7.8 minutes  FINDING(S): 1.  patent IVC  SPECIMEN(S):  IVC filter  INDICATIONS:    Patient is a 62 y.o. female who presents with a previous history of IVC filter placement. Patient has tolerated anticoagulation and no longer needs this filter. The patient remains on anticoagulation. Risks and benefits were discussed, and informed consent was obtained.  DESCRIPTION: After obtaining full informed written consent, the patient was brought back to the vascular suite and placed supine upon the table. Moderate conscious sedation was administered during a face to face encounter with the patient throughout the procedure with my supervision of the RN administering medicines and monitoring the patient's vital signs, pulse oximetry, telemetry and mental status throughout from the start of the procedure until the patient was taken to the recovery room.  After obtaining adequate anesthesia, the patient was prepped and draped in the standard fashion.  The right jugular vein was visualized with ultrasound and found to be widely patent. It was then accessed under direct ultrasound guidance without difficulty with the Seldinger needle and a permanent image was recorded. A J-wire was placed. After skin nick and dilatation, the retrieval sheath was placed over the wire and advanced into the  inferior vena cava. Inferior vena cava was imaged and found to be widely patent on inferior venacavogram. The filter was fairly straight in its orientation. The retrieval snare was then placed through the sheath and the hook of the filter was snared without difficulty. The sheath was then advanced, and the filter was collapsed and brought into the sheath in its entirety. It was then removed from the body in its entirety. The retrieval sheath was then removed. Pressure was held at the access site and sterile dressing was placed. The patient was taken to the recovery room in stable condition having tolerated the procedure well.  COMPLICATIONS: None  CONDITION:  Stable   Festus Barren 07/29/2023 9:29 AM  This note was created with Dragon Medical transcription system. Any errors in dictation are purely unintentional.

## 2023-07-30 ENCOUNTER — Encounter: Payer: Self-pay | Admitting: Vascular Surgery

## 2023-10-22 ENCOUNTER — Encounter: Payer: Self-pay | Admitting: *Deleted

## 2023-10-22 ENCOUNTER — Emergency Department: Payer: BC Managed Care – PPO

## 2023-10-22 ENCOUNTER — Emergency Department
Admission: EM | Admit: 2023-10-22 | Discharge: 2023-10-23 | Disposition: A | Discharge status: Home / Self Care | Payer: BC Managed Care – PPO | Attending: Emergency Medicine | Admitting: Emergency Medicine

## 2023-10-22 ENCOUNTER — Other Ambulatory Visit: Payer: Self-pay

## 2023-10-22 DIAGNOSIS — R600 Localized edema: Secondary | ICD-10-CM | POA: Insufficient documentation

## 2023-10-22 LAB — BASIC METABOLIC PANEL
Anion gap: 9 (ref 5–15)
BUN: 12 mg/dL (ref 8–23)
CO2: 23 mmol/L (ref 22–32)
Calcium: 8.6 mg/dL — ABNORMAL LOW (ref 8.9–10.3)
Chloride: 105 mmol/L (ref 98–111)
Creatinine, Ser: 0.72 mg/dL (ref 0.44–1.00)
GFR, Estimated: >60 mL/min (ref >60)
Glucose, Bld: 83 mg/dL (ref 70–99)
Potassium: 3.5 mmol/L (ref 3.5–5.1)
Sodium: 137 mmol/L (ref 135–145)

## 2023-10-22 LAB — PROTIME-INR
INR: 1 (ref 0.8–1.2)
Prothrombin Time: 13.4 s (ref 11.4–15.2)

## 2023-10-22 LAB — CBC
HCT: 30.5 % — ABNORMAL LOW (ref 36.0–46.0)
Hemoglobin: 9.2 g/dL — ABNORMAL LOW (ref 12.0–15.0)
MCH: 25.3 pg — ABNORMAL LOW (ref 26.0–34.0)
MCHC: 30.2 g/dL (ref 30.0–36.0)
MCV: 83.8 fL (ref 80.0–100.0)
Platelets: 298 10*3/uL (ref 150–400)
RBC: 3.64 MIL/uL — ABNORMAL LOW (ref 3.87–5.11)
RDW: 18.1 % — ABNORMAL HIGH (ref 11.5–15.5)
WBC: 4.1 10*3/uL (ref 4.0–10.5)
nRBC: 0 % (ref 0.0–0.2)

## 2023-10-22 LAB — LAB REPORT - SCANNED: EGFR: 86

## 2023-10-22 LAB — HEPATIC FUNCTION PANEL
ALT: 14 U/L (ref 0–44)
AST: 20 U/L (ref 15–41)
Albumin: 3.9 g/dL (ref 3.5–5.0)
Alkaline Phosphatase: 83 U/L (ref 38–126)
Bilirubin, Direct: 0.1 mg/dL (ref 0.0–0.2)
Indirect Bilirubin: 0.4 mg/dL (ref 0.3–0.9)
Total Bilirubin: 0.5 mg/dL (ref <1.2)
Total Protein: 6.9 g/dL (ref 6.5–8.1)

## 2023-10-22 LAB — BRAIN NATRIURETIC PEPTIDE: B Natriuretic Peptide: 116.2 pg/mL — ABNORMAL HIGH (ref 0.0–100.0)

## 2023-10-22 LAB — TROPONIN I (HIGH SENSITIVITY)
Troponin I (High Sensitivity): 4 ng/L (ref <18)
Troponin I (High Sensitivity): 4 ng/L (ref <18)

## 2023-10-22 MED ORDER — ACETAMINOPHEN 500 MG PO TABS
ORAL_TABLET | ORAL | Status: AC
  Filled 2023-10-22: qty 2

## 2023-10-22 MED ORDER — ACETAMINOPHEN 500 MG PO TABS
1000.0000 mg | ORAL_TABLET | Freq: Once | ORAL | Status: AC
Start: 2023-10-22 — End: 2023-10-22
  Administered 2023-10-22: 1000 mg via ORAL

## 2023-10-22 MED ORDER — IOHEXOL 350 MG/ML SOLN
100.0000 mL | Freq: Once | INTRAVENOUS | Status: AC | PRN
  Administered 2023-10-22: 100 mL via INTRAVENOUS

## 2023-10-22 NOTE — ED Notes (Signed)
Pt taken to room 5 via w/c by EDT Arianne; report called to Tawni Millers RN

## 2023-10-22 NOTE — ED Triage Notes (Signed)
Pt to triage via wheelchair.  Pt sent to ER for eval of blood clots in legs and lungs.  No sob.  No chest pain.  Pt denies any pain.  Pt alert  speech clear.

## 2023-10-22 NOTE — ED Provider Notes (Addendum)
[facility] Provider Note    None    (approximate)   History   Leg Swelling   HPI  Erica Powers is a 62 y.o. female with history of DVT, PE who comes in with concerns for leg swelling.  Patient reports having knee surgery done recently.  She did well postop and she previously had an IVC filter but they decided to remove it since she had been doing well.  She reports that she has some increased swelling in her bilateral legs that she noticed 2 or 3 days ago when she saw her primary care doctor they did some blood work and they were concerned about a blood clot so they sent her here.  Unfortunately I am not able to see any of the blood work that was done by suspect that patient could have had a D-dimer ordered.  She reports the swelling has come down significantly but she does report some cramping in her legs some mild exertional shortness of breath as well as some intermittent headaches .  She reports that she typically gets some headaches and this is not the worst headache of her life but she reports that it has been constantly nagging for about a week with some intermittent vision changes that she reports is sensitivity to light when she was driving with the headlights.  Patient's biggest concern is that she has another blood clot.  She reports that last time she came in for leg pain they discharged her and then she showed up 2 or 3 days later with massive pulmonary embolism that she was in the ICU for.   Physical Exam   Triage Vital Signs: ED Triage Vitals  Encounter Vitals Group     BP 10/22/23 1712 (!) 221/88     Systolic BP Percentile --      Diastolic BP Percentile --      Pulse Rate 10/22/23 1712 (!) 56     Resp 10/22/23 1712 20     Temp 10/22/23 1712 98 F (36.7 C)     Temp Source 10/22/23 1712 Oral     SpO2 10/22/23 1712 100 %     Weight 10/22/23 1713 172 lb (78 kg)     Height 10/22/23 1713 5\' 5"  (1.651 m)     Head Circumference --      Peak  Flow --      Pain Score 10/22/23 1712 0     Pain Loc --      Pain Education --      Exclude from Growth Chart --     Most recent vital signs: Vitals:   10/22/23 2055 10/22/23 2101  BP: (!) 153/94   Pulse: 72   Resp: 19   Temp:  98.1 F (36.7 C)  SpO2: 100%      General: Awake, no distress.  CV:  Good peripheral perfusion.  Resp:  Normal effort.  Abd:  No distention.  Other:  Swelling noted in bilateral legs with good distal pulse. Extraocular movements are intact.  Pupils reactive bilaterally.  No nerves appear intact  ED Results / Procedures / Treatments   Labs (all labs ordered are listed, but only abnormal results are displayed) Labs Reviewed  BASIC METABOLIC PANEL - Abnormal; Notable for the following components:      Result Value   Calcium 8.6 (*)    All other components within normal limits  CBC - Abnormal; Notable for the following components:   RBC 3.64 (*)  Hemoglobin 9.2 (*)    HCT 30.5 (*)    MCH 25.3 (*)    RDW 18.1 (*)    All other components within normal limits  PROTIME-INR  TROPONIN I (HIGH SENSITIVITY)  TROPONIN I (HIGH SENSITIVITY)     EKG  My interpretation of EKG:  Sinus bradycardia rate of 57 without any ST elevation or T wave inversions, normal intervals  RADIOLOGY I have reviewed the xray personally and interpreted no evidence of any pneumonia.  PROCEDURES:  Critical Care performed: No  .1-3 Lead EKG Interpretation  Performed by: Concha Se, MD Authorized by: Concha Se, MD     Interpretation: normal     ECG rate:  70   ECG rate assessment: normal     Rhythm: sinus rhythm     Ectopy: none     Conduction: normal      MEDICATIONS ORDERED IN ED: Medications  acetaminophen (TYLENOL) tablet 1,000 mg (has no administration in time range)     IMPRESSION / MDM / ASSESSMENT AND PLAN / ED COURSE  I reviewed the triage vital signs and the nursing notes.   Patient's presentation is most consistent with acute  presentation with potential threat to life or bodily function.   Patient comes in with leg swelling.  Differential includes heart failure, lymphedema, DVT, nephrotic syndrome, cirrhosis.  Workup was ordered to evaluate.  Given patient's history of DVTs ultrasounds were ordered.  She does report a little bit of exertional dyspnea although looks pretty comfortable satting at 100% will get CT PE evaluate for pulmonary embolism.  Given her headaches that are new for her we will also add on a CT venogram.  Does not sound like subarachnoid hemorrhage given and not sudden or severe in onset.  Troponin negative.  BMP reassuring CBC shows slightly low hemoglobin of 9.2.  Coags normal.  She denies any bleeding issues I suspect this is just delusional from some fluid overload.  I reviewed the notes from her care doctor where it appeared that they restart her on some Lasix and refer her to heart failure, cardiology clinic.  I will place a referral for her as well.  Patient be handed off pending imaging, ultrasounds.  Given some Tylenol for pain.  If workup is negative will discharge on 20 mg of Lasix for 3 days given her hypertension and concern for slight fluid overload.  The patient is on the cardiac monitor to evaluate for evidence of arrhythmia and/or significant heart rate changes.      FINAL CLINICAL IMPRESSION(S) / ED DIAGNOSES   Final diagnoses:  Leg edema     Rx / DC Orders   ED Discharge Orders     None        Note:  This document was prepared using Dragon voice recognition software and may include unintentional dictation errors.   Concha Se, MD 10/22/23 2147    Concha Se, MD 10/22/23 2255    Concha Se, MD 10/22/23 [id number]

## 2023-10-22 NOTE — ED Notes (Signed)
Patient able to ambulate to restroom with no difficulty or distress

## 2023-10-23 MED ORDER — FUROSEMIDE 20 MG PO TABS: 20.0000 mg | ORAL_TABLET | Freq: Every day | ORAL | 0 refills | Status: DC

## 2023-10-24 ENCOUNTER — Ambulatory Visit: Payer: BC Managed Care – PPO | Attending: Cardiology | Admitting: Cardiology

## 2023-10-24 VITALS — BP 137/81 | HR 57 | Wt 175.0 lb

## 2023-10-24 DIAGNOSIS — I5032 Chronic diastolic (congestive) heart failure: Secondary | ICD-10-CM

## 2023-10-24 DIAGNOSIS — I10 Essential (primary) hypertension: Secondary | ICD-10-CM

## 2023-10-24 MED ORDER — POTASSIUM CHLORIDE CRYS ER 20 MEQ PO TBCR: 20.0000 meq | EXTENDED_RELEASE_TABLET | ORAL | 5 refills | Status: DC | PRN

## 2023-10-24 MED ORDER — FUROSEMIDE 20 MG PO TABS: 20.0000 mg | ORAL_TABLET | ORAL | 5 refills | Status: DC | PRN

## 2023-10-24 NOTE — Patient Instructions (Signed)
Start taking Lasix 20 MG AS NEEDED for swelling and weight gain.  Take Potassium 20 MEQ when you take your Lasix.  Please have your echo completed. You will check in for this at the [facility]. You have to arrive 15 MINS EARLY for preparation, otherwise you will have to reschedule.

## 2023-10-24 NOTE — Progress Notes (Signed)
ADVANCED HEART FAILURE CLINIC NOTE  Referring Physician: Concha Se, MD  Primary Care: Hillery Aldo, MD  HPI: Erica Powers is a 62 y.o. female is a 62 year old female with history of PE, DVT with IVC filter (now extracted), asthma that was seen in the [facility] emergency department on 10/22/2023 for worsening lower extremity edema and shortness of breath.  She presents today as a posthospital follow-up.  Her most recent echocardiogram done at [facility] on 04/18/2023 demonstrates normal left and right ventricular function with no mention of diastolic dysfunction. She has been followed by pulmonology there for shortness of breath secondary to asthma.   In the ER on 10/22/23, CTA-PE negative, lower extremity dopplers were negative for DVT and CT venogram for headaches was also unremarkable.   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 She remains fairly active through the day. According to her, over the past 1-2 weeks she has noticed worsening lower extremity edema, shortness of breath and anxiety at night. Due to severe anxiety associated with LE edema and some dyspnea she went to the ER where above work up was completed. She was seen by her PCP who started her on losartan for uncontrolled HTN & lasix 20mg  daily x 3 days. She reports continued improvement in symptoms since that time. She started taking lasix 20mg  daily yesterday with immediate improvement in LE edema and shortness of breath.   Activity level/exercise tolerance:  NYHA II Orthopnea:  Sleeps on 2 pillows Paroxysmal noctural dyspnea:  No, resolved Chest pain/pressure:  no Orthostatic lightheadedness:  no Palpitations:  no Lower extremity edema:  no Presyncope/syncope:  no Cough:  no  Past Medical History:  Diagnosis Date   Acute right-sided low back pain    Anemia    Arthritis    Asthma    Complication of anesthesia    Woke during IVC filter removal   Diabetes mellitus without complication (HCC)    Difficult  intubation    pt denies   DVT, lower extremity (HCC) 01/15/2015   right leg that went to lung   GERD (gastroesophageal reflux disease)    History of hiatal hernia    Hypertension    Osteoarthritis of lumbar spine, unspecified spinal osteoarthritis complication status    Pulmonary embolism (HCC) 01/15/2015   Rheumatoid factor positive    Sleep apnea     Current Outpatient Medications  Medication Sig Dispense Refill   acetaminophen (TYLENOL) 500 MG tablet Take 500 mg by mouth every 6 (six) hours as needed.     albuterol (PROVENTIL HFA;VENTOLIN HFA) 108 (90 Base) MCG/ACT inhaler Inhale 1-2 puffs into the lungs every 4 (four) hours as needed for wheezing or shortness of breath.      azelastine (ASTELIN) 0.1 % nasal spray USE 1 TO 2 SPRAYS IN EACH NOSTRIL EVERY 12 HOURS AS NEEDED FOR ALLERGIES     baclofen (LIORESAL) 10 MG tablet Take 10 mg by mouth 3 (three) times daily.     budesonide-formoterol (SYMBICORT) 80-4.5 MCG/ACT inhaler Inhale 2 puffs into the lungs 2 (two) times daily as needed.     Calcium Carbonate-Vit D-Min (CALCIUM 600+D PLUS MINERALS) 600-400 MG-UNIT TABS Take by mouth. (Patient not taking: Reported on 07/29/2023)     celecoxib (CELEBREX) 200 MG capsule Take 1 capsule (200 mg total) by mouth 2 (two) times daily. 45 capsule 1   cyanocobalamin (,VITAMIN B-12,) 1000 MCG/ML injection Inject 1ml once a week for 4 weeks then once a month (Patient not taking: Reported on  07/29/2023) 8 mL 0   cyclobenzaprine (FLEXERIL) 5 MG tablet Take 1 tablet (5 mg total) by mouth 3 (three) times daily as needed. 15 tablet 0   enoxaparin (LOVENOX) 40 MG/0.4ML injection Inject 0.4 mLs (40 mg total) into the skin daily for 14 days. 5.6 mL 0   ferrous sulfate 325 (65 FE) MG EC tablet Take by mouth. (Patient not taking: Reported on 07/29/2023)     fluticasone (FLONASE) 50 MCG/ACT nasal spray Place 1 spray into both nostrils daily.     furosemide (LASIX) 20 MG tablet Take 1 tablet (20 mg total) by mouth  daily. 3 tablet 0   losartan (COZAAR) 25 MG tablet Take 25 mg by mouth daily. (Patient not taking: Reported on 07/29/2023)     Menthol, Topical Analgesic, (BIOFREEZE) 4 % GEL Apply 1 Application topically as needed. Left knee     montelukast (SINGULAIR) 10 MG tablet Take 10 mg by mouth at bedtime.     Multiple Vitamin (MULTI-VITAMIN) tablet Take by mouth.     Olopatadine HCl (PATADAY) 0.7 % SOLN Apply 1 drop to eye as needed. Both eyes as needed     oxyCODONE (OXY IR/ROXICODONE) 5 MG immediate release tablet Take 1 tablet (5 mg total) by mouth every 4 (four) hours as needed for moderate pain (pain score 4-6). 30 tablet 0   Semaglutide-Weight Management 2.4 MG/0.75ML SOAJ Inject 2.4 mg into the skin once a week.     traMADol (ULTRAM) 50 MG tablet Take 1-2 tablets (50-100 mg total) by mouth every 4 (four) hours as needed for moderate pain. 30 tablet 0   Vitamin D, Ergocalciferol, (DRISDOL) 1.25 MG (50000 UNIT) CAPS capsule TAKE 1 CAPSULE BY MOUTH EVERY 7 DAYS (Patient not taking: Reported on 05/29/2023) 12 capsule 0   No current facility-administered medications for this visit.    Allergies  Allergen Reactions   Codeine Itching   Hydrocodone Itching      Social History   Socioeconomic History   Marital status: Divorced    Spouse name: Not on file   Number of children: 2   Years of education: Not on file   Highest education level: Some college, no degree  Occupational History   Not on file  Tobacco Use   Smoking status: Never   Smokeless tobacco: Never  Vaping Use   Vaping status: Never Used  Substance and Sexual Activity   Alcohol use: Not Currently   Drug use: No   Sexual activity: Not on file  Other Topics Concern   Not on file  Social History Narrative   Lives alone. Son, grand kids and sisters are support persons.   Social Determinants of Health   Financial Resource Strain: Medium Risk (11/16/2019)   [facility]   Overall Financial  Resource Strain (CARDIA)    Difficulty of Paying Living Expenses: Somewhat hard  Food Insecurity: No Food Insecurity (05/15/2023)   Hunger Vital Sign    Worried About Running Out of Food in the Last Year: Never true    Ran Out of Food in the Last Year: Never true  Transportation Needs: No Transportation Needs (05/15/2023)   PRAPARE - Administrator, Civil Service (Medical): No    Lack of Transportation (Non-Medical): No  Physical Activity: Inactive (11/16/2019)   Received from [facility], [facility]   Exercise Vital Sign    Days of Exercise per Week: 0 days    Minutes of Exercise per  Session: 0 min  Stress: No Stress Concern Present (11/16/2019)   Received from [facility], [facility]   [facility], [place] of Occupational Health - Occupational Stress Questionnaire    Feeling of Stress : Not at all  Social Connections: Unknown (11/16/2019)   Received from [facility], [facility]   Social Connection and Isolation Panel [NHANES]    Frequency of Communication with Friends and Family: More than three times a week    Frequency of Social Gatherings with Friends and Family: More than three times a week    Attends Religious Services: Not on file    Active Member of Clubs or Organizations: Yes    Attends Banker Meetings: Not on file    Marital Status: Divorced  Intimate Partner Violence: Not At Risk (05/15/2023)   Humiliation, Afraid, Rape, and Kick questionnaire    Fear of Current or Ex-Partner: No    Emotionally Abused: No    Physically Abused: No    Sexually Abused: No      Family History  Problem Relation Age of Onset   Diabetes Mother    Hypertension Mother    Breast cancer Neg Hx     PHYSICAL EXAM: There were no vitals filed for this visit. GENERAL: Well nourished, well developed, and in no apparent distress at rest.  HEENT: Negative for arcus senilis or xanthelasma. There is no scleral icterus.  The mucous membranes are pink  and moist.   NECK: Supple, No masses. Normal carotid upstrokes without bruits. No masses or thyromegaly.    CHEST: There are no chest wall deformities. There is no chest wall tenderness. Respirations are unlabored.  Lungs- CTA B/L CARDIAC:  JVP: 8 cm H2O         Normal S1, S2  Normal rate with regular rhythm. No murmurs, rubs or gallops.  Pulses are 2+ and symmetrical in upper and lower extremities. 1+ edema.  ABDOMEN: Soft, non-tender, non-distended. There are no masses or hepatomegaly. There are normal bowel sounds.  EXTREMITIES: Warm and well perfused with no cyanosis, clubbing.  LYMPHATIC: No axillary or supraclavicular lymphadenopathy.  NEUROLOGIC: Patient is oriented x3 with no focal or lateralizing neurologic deficits.  PSYCH: Patients affect is appropriate, there is no evidence of anxiety or depression.  SKIN: Warm and dry; no lesions or wounds.   DATA REVIEW  ECG: 10/24/23: sinus bradycardia  As per my personal interpretation  ECHO: 04/18/23:  ([facility]) NORMAL LEFT VENTRICULAR SYSTOLIC FUNCTION  NORMAL RIGHT VENTRICULAR SYSTOLIC FUNCTION  MILD VALVULAR REGURGITATION (See above)  NO VALVULAR STENOSIS   02/05/22: ([facility]) Normal right and left ventricular function.   ASSESSMENT & PLAN:  Evaluation for HFpEF - She has clinical symptoms of HFpEF in addition to a mildly increased BNP from her recent ER visit.  - Will obtain TTE for better characterization of her myocardium and diastolic function.  - Likely secondary to uncontrolled hypertension  - Continue losartan 25mg  daily.  - Plan to start SGLT2I vs spironolactone in the future.  - Repeat labs and follow up in 3 weeks for continued uptitration of anti-HTN and TTE.  - Reviewed TTE & labs from [facility] (5/24) as noted above.   2. Hypertension  - continue losartan 25mg  daily - Plan to increase at follow up.  - BMP/BNP at follow up.  - Reviewed labs from [facility] in care everywhere  Lecretia Buczek Advanced Heart  Failure Mechanical Circulatory Support

## 2023-10-28 ENCOUNTER — Other Ambulatory Visit: Payer: Self-pay

## 2023-10-28 MED ORDER — FUROSEMIDE 20 MG PO TABS: 20.0000 mg | ORAL_TABLET | ORAL | 2 refills | Status: DC | PRN

## 2023-10-28 MED ORDER — POTASSIUM CHLORIDE CRYS ER 20 MEQ PO TBCR: 20.0000 meq | EXTENDED_RELEASE_TABLET | ORAL | 5 refills | Status: DC | PRN

## 2023-11-04 ENCOUNTER — Telehealth: Payer: Self-pay | Admitting: Cardiology

## 2023-11-05 ENCOUNTER — Other Ambulatory Visit: Payer: Self-pay | Admitting: *Deleted

## 2023-11-05 ENCOUNTER — Other Ambulatory Visit: Payer: Self-pay | Admitting: Family Medicine

## 2023-11-05 DIAGNOSIS — Z1231 Encounter for screening mammogram for malignant neoplasm of breast: Secondary | ICD-10-CM

## 2023-11-05 MED ORDER — POTASSIUM CHLORIDE CRYS ER 20 MEQ PO TBCR: 20.0000 meq | EXTENDED_RELEASE_TABLET | ORAL | 5 refills | Status: DC | PRN

## 2023-11-18 ENCOUNTER — Telehealth: Payer: Self-pay | Admitting: Cardiology

## 2023-11-18 NOTE — Telephone Encounter (Signed)
Pt confirmed appt for 11/19/23

## 2023-11-19 ENCOUNTER — Ambulatory Visit (HOSPITAL_BASED_OUTPATIENT_CLINIC_OR_DEPARTMENT_OTHER): Payer: BC Managed Care – PPO | Admitting: Cardiology

## 2023-11-19 ENCOUNTER — Other Ambulatory Visit
Admission: RE | Admit: 2023-11-19 | Discharge: 2023-11-19 | Disposition: A | Discharge status: Home / Self Care | Payer: BC Managed Care – PPO | Source: Ambulatory Visit | Attending: Cardiology | Admitting: Cardiology

## 2023-11-19 VITALS — BP 114/55 | HR 76 | Wt 173.0 lb

## 2023-11-19 DIAGNOSIS — I10 Essential (primary) hypertension: Secondary | ICD-10-CM | POA: Diagnosis not present

## 2023-11-19 DIAGNOSIS — I110 Hypertensive heart disease with heart failure: Secondary | ICD-10-CM | POA: Insufficient documentation

## 2023-11-19 DIAGNOSIS — R600 Localized edema: Secondary | ICD-10-CM | POA: Insufficient documentation

## 2023-11-19 DIAGNOSIS — I959 Hypotension, unspecified: Secondary | ICD-10-CM | POA: Insufficient documentation

## 2023-11-19 DIAGNOSIS — Z79899 Other long term (current) drug therapy: Secondary | ICD-10-CM | POA: Diagnosis not present

## 2023-11-19 DIAGNOSIS — I5032 Chronic diastolic (congestive) heart failure: Secondary | ICD-10-CM | POA: Diagnosis not present

## 2023-11-19 DIAGNOSIS — Z86711 Personal history of pulmonary embolism: Secondary | ICD-10-CM | POA: Insufficient documentation

## 2023-11-19 DIAGNOSIS — R7989 Other specified abnormal findings of blood chemistry: Secondary | ICD-10-CM | POA: Diagnosis not present

## 2023-11-19 DIAGNOSIS — Z5986 Financial insecurity: Secondary | ICD-10-CM | POA: Diagnosis not present

## 2023-11-19 DIAGNOSIS — J45909 Unspecified asthma, uncomplicated: Secondary | ICD-10-CM | POA: Insufficient documentation

## 2023-11-19 LAB — BASIC METABOLIC PANEL
Anion gap: 8 (ref 5–15)
BUN: 13 mg/dL (ref 8–23)
CO2: 27 mmol/L (ref 22–32)
Calcium: 9 mg/dL (ref 8.9–10.3)
Chloride: 104 mmol/L (ref 98–111)
Creatinine, Ser: 0.79 mg/dL (ref 0.44–1.00)
GFR, Estimated: >60 mL/min (ref >60)
Glucose, Bld: 70 mg/dL (ref 70–99)
Potassium: 3.7 mmol/L (ref 3.5–5.1)
Sodium: 139 mmol/L (ref 135–145)

## 2023-11-19 LAB — BRAIN NATRIURETIC PEPTIDE: B Natriuretic Peptide: 142.9 pg/mL — ABNORMAL HIGH (ref 0.0–100.0)

## 2023-11-19 MED ORDER — LOSARTAN POTASSIUM 25 MG PO TABS: 12.5000 mg | ORAL_TABLET | Freq: Every day | ORAL | 3 refills | Status: DC

## 2023-11-19 NOTE — Patient Instructions (Addendum)
Medication Changes:  DECREASE LOSARTAN 12.5MG  ONCE DAILY   Lab Work:  Go over to the MEDICAL MALL. Go pass the gift shop and have your blood work completed.  We will only call you if the results are abnormal or if the provider would like to make medication changes.  Follow-Up in: 4 MONTHS PLEASE CALL OUR OFFICE AROUND MARCH 2025 TO GET SCHEDULED FOR YOUR APPOINTMENT  If you have any questions or concerns before your next appointment please send [place] a message through [place] or call our office at [phone] Monday-Friday 8 am-5 pm.   If you have an urgent need after hours on the weekend please call your Primary Cardiologist or the Advanced [facility] Failure Clinic in [place] at [phone].   At the Advanced [facility] Failure Clinic, you and your health needs are our priority. We have a designated team specialized in the treatment of Heart Failure. This Care Team includes your primary Heart Failure Specialized Cardiologist (physician), Advanced Practice Providers (APPs- Physician Assistants and Nurse Practitioners), and Pharmacist who all work together to provide you with the care you need, when you need it.   You may see any of the following providers on your designated Care Team at your next follow up:  Dr. Arvilla Meres Dr. Marca Ancona Dr. Dorthula Nettles Dr. Theresia Bough Tonye Becket, NP Robbie Lis, [place] [address] [place], [place] Brynda Peon, NP [place] Lee, NP Clarisa Kindred, NP Enos Fling, PharmD

## 2023-11-19 NOTE — Progress Notes (Signed)
ADVANCED HEART FAILURE CLINIC NOTE  Referring Physician: Hillery Aldo, MD  Primary Care: Hillery Aldo, MD  HPI: Erica Powers is a 62 y.o. female is a 62 year old female with history of PE, DVT with IVC filter (now extracted), asthma that was seen in the [facility] emergency department on 10/22/2023 for worsening lower extremity edema and shortness of breath.  She presents today as a posthospital follow-up.  Her most recent echocardiogram done at [facility] on 04/18/2023 demonstrates normal left and right ventricular function with no mention of diastolic dysfunction. She has been followed by pulmonology there for shortness of breath secondary to asthma.   In the ER on 10/22/23, CTA-PE negative, lower extremity dopplers were negative for DVT and CT venogram for headaches was also unremarkable.   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 She remains fairly active through the day. According to her, over the past 1-2 weeks she has noticed worsening lower extremity edema, shortness of breath and anxiety at night. Due to severe anxiety associated with LE edema and some dyspnea she went to the ER where above work up was completed. She was seen by her PCP who started her on losartan for uncontrolled HTN & lasix 20mg  daily x 3 days. She reports continued improvement in symptoms since that time. She started taking lasix 20mg  daily yesterday with immediate improvement in LE edema and shortness of breath.   Interval hx:  - During her last appointment we started losartan 25mg  daily. Reports significant improvement in her LE edema since that time. She has started having frequent episodes of orthostatic hypotension associated with lightheadedness.   Activity level/exercise tolerance:  NYHA II Orthopnea:  Sleeps on 2 pillows Paroxysmal noctural dyspnea:  No, resolved Chest pain/pressure:  no Orthostatic lightheadedness:  Yes, frequent Palpitations:  no Lower extremity edema:  no Presyncope/syncope:   no Cough:  no  Past Medical History:  Diagnosis Date   Acute right-sided low back pain    Anemia    Arthritis    Asthma    Complication of anesthesia    Woke during IVC filter removal   Diabetes mellitus without complication (HCC)    Difficult intubation    pt denies   DVT, lower extremity (HCC) 01/15/2015   right leg that went to lung   GERD (gastroesophageal reflux disease)    History of hiatal hernia    Hypertension    Osteoarthritis of lumbar spine, unspecified spinal osteoarthritis complication status    Pulmonary embolism (HCC) 01/15/2015   Rheumatoid factor positive    Sleep apnea     Current Outpatient Medications  Medication Sig Dispense Refill   acetaminophen (TYLENOL) 500 MG tablet Take 500 mg by mouth every 6 (six) hours as needed.     albuterol (PROVENTIL HFA;VENTOLIN HFA) 108 (90 Base) MCG/ACT inhaler Inhale 1-2 puffs into the lungs every 4 (four) hours as needed for wheezing or shortness of breath.      azelastine (ASTELIN) 0.1 % nasal spray USE 1 TO 2 SPRAYS IN EACH NOSTRIL EVERY 12 HOURS AS NEEDED FOR ALLERGIES     baclofen (LIORESAL) 10 MG tablet Take 10 mg by mouth 3 (three) times daily.     budesonide-formoterol (SYMBICORT) 80-4.5 MCG/ACT inhaler Inhale 2 puffs into the lungs 2 (two) times daily as needed.     Calcium Carbonate-Vit D-Min (CALCIUM 600+D PLUS MINERALS) 600-400 MG-UNIT TABS Take by mouth. (Patient not taking: Reported on 07/29/2023)     celecoxib (CELEBREX) 200 MG capsule Take 1 capsule (  200 mg total) by mouth 2 (two) times daily. 45 capsule 1   cyanocobalamin (,VITAMIN B-12,) 1000 MCG/ML injection Inject 1ml once a week for 4 weeks then once a month (Patient not taking: Reported on 07/29/2023) 8 mL 0   cyclobenzaprine (FLEXERIL) 5 MG tablet Take 1 tablet (5 mg total) by mouth 3 (three) times daily as needed. 15 tablet 0   enoxaparin (LOVENOX) 40 MG/0.4ML injection Inject 0.4 mLs (40 mg total) into the skin daily for 14 days. 5.6 mL 0   ferrous  sulfate 325 (65 FE) MG EC tablet Take by mouth. (Patient not taking: Reported on 07/29/2023)     fluticasone (FLONASE) 50 MCG/ACT nasal spray Place 1 spray into both nostrils daily.     furosemide (LASIX) 20 MG tablet Take 1 tablet (20 mg total) by mouth as needed. 30 tablet 5   furosemide (LASIX) 20 MG tablet Take 1 tablet (20 mg total) by mouth as needed. For weight gain of 3lb in 24 hours or 5 lb in a week 30 tablet 2   losartan (COZAAR) 25 MG tablet Take 25 mg by mouth daily. (Patient not taking: Reported on 07/29/2023)     Menthol, Topical Analgesic, (BIOFREEZE) 4 % GEL Apply 1 Application topically as needed. Left knee     montelukast (SINGULAIR) 10 MG tablet Take 10 mg by mouth at bedtime.     Multiple Vitamin (MULTI-VITAMIN) tablet Take by mouth.     Olopatadine HCl (PATADAY) 0.7 % SOLN Apply 1 drop to eye as needed. Both eyes as needed     oxyCODONE (OXY IR/ROXICODONE) 5 MG immediate release tablet Take 1 tablet (5 mg total) by mouth every 4 (four) hours as needed for moderate pain (pain score 4-6). 30 tablet 0   potassium chloride SA (KLOR-CON M) 20 MEQ tablet Take 1 tablet (20 mEq total) by mouth as needed. Only take when you take your lasix 30 tablet 5   Semaglutide-Weight Management 2.4 MG/0.75ML SOAJ Inject 2.4 mg into the skin once a week.     traMADol (ULTRAM) 50 MG tablet Take 1-2 tablets (50-100 mg total) by mouth every 4 (four) hours as needed for moderate pain. 30 tablet 0   Vitamin D, Ergocalciferol, (DRISDOL) 1.25 MG (50000 UNIT) CAPS capsule TAKE 1 CAPSULE BY MOUTH EVERY 7 DAYS (Patient not taking: Reported on 05/29/2023) 12 capsule 0   No current facility-administered medications for this visit.    Allergies  Allergen Reactions   Codeine Itching   Hydrocodone Itching      Social History   Socioeconomic History   Marital status: Divorced    Spouse name: Not on file   Number of children: 2   Years of education: Not on file   Highest education level: Some college, no  degree  Occupational History   Not on file  Tobacco Use   Smoking status: Never   Smokeless tobacco: Never  Vaping Use   Vaping status: Never Used  Substance and Sexual Activity   Alcohol use: Not Currently   Drug use: No   Sexual activity: Not on file  Other Topics Concern   Not on file  Social History Narrative   Lives alone. Son, grand kids and sisters are support persons.   Social Drivers of Health   Financial Resource Strain: Medium Risk (11/16/2019)   Received from [facility]   Overall Financial Resource Strain (CARDIA)    Difficulty of Paying Living Expenses: Somewhat hard  Food Insecurity:  No Food Insecurity (05/15/2023)   Hunger Vital Sign    Worried About Running Out of Food in the Last Year: Never true    Ran Out of Food in the Last Year: Never true  Transportation Needs: No Transportation Needs (05/15/2023)   PRAPARE - Administrator, Civil Service (Medical): No    Lack of Transportation (Non-Medical): No  Physical Activity: Inactive (11/16/2019)   Received from [facility], [facility]   Exercise Vital Sign    Days of Exercise per Week: 0 days    Minutes of Exercise per Session: 0 min  Stress: No Stress Concern Present (11/16/2019)   Received from [facility], [facility] - Occupational Stress Questionnaire    Feeling of Stress : Not at all  Social Connections: Unknown (11/16/2019)   Received from [facility], [facility]   Social Connection and Isolation Panel [NHANES]    Frequency of Communication with Friends and Family: More than three times a week    Frequency of Social Gatherings with Friends and Family: More than three times a week    Attends Religious Services: Not on file    Active Member of Clubs or Organizations: Yes    Attends Banker Meetings: Not on file    Marital Status: Divorced  Intimate Partner Violence: Not At Risk (05/15/2023)    Humiliation, Afraid, Rape, and Kick questionnaire    Fear of Current or Ex-Partner: No    Emotionally Abused: No    Physically Abused: No    Sexually Abused: No      Family History  Problem Relation Age of Onset   Diabetes Mother    Hypertension Mother    Breast cancer Neg Hx     PHYSICAL EXAM: Vitals:   11/19/23 1453  BP: (!) 114/55  Pulse: 76  SpO2: 100%   GENERAL: Well nourished, well developed, and in no apparent distress at rest.  HEENT: Negative for arcus senilis or xanthelasma. There is no scleral icterus.  The mucous membranes are pink and moist.   NECK: Supple, No masses. Normal carotid upstrokes without bruits. No masses or thyromegaly.    CHEST: There are no chest wall deformities. There is no chest wall tenderness. Respirations are unlabored.  Lungs- CTA B/L CARDIAC:  JVP: 7 cm          Normal rate with regular rhythm. No murmurs, rubs or gallops.  Pulses are 2+ and symmetrical in upper and lower extremities. No edema.  ABDOMEN: Soft, non-tender, non-distended. There are no masses or hepatomegaly. There are normal bowel sounds.  EXTREMITIES: Warm and well perfused with no cyanosis, clubbing.  LYMPHATIC: No axillary or supraclavicular lymphadenopathy.  NEUROLOGIC: Patient is oriented x3 with no focal or lateralizing neurologic deficits.  PSYCH: Patients affect is appropriate, there is no evidence of anxiety or depression.  SKIN: Warm and dry; no lesions or wounds.    DATA REVIEW  ECG: 10/24/23: sinus bradycardia  As per my personal interpretation  ECHO: 04/18/23:  ([facility]) NORMAL LEFT VENTRICULAR SYSTOLIC FUNCTION  NORMAL RIGHT VENTRICULAR SYSTOLIC FUNCTION  MILD VALVULAR REGURGITATION (See above)  NO VALVULAR STENOSIS   02/05/22: ([facility]) Normal right and left ventricular function.   ASSESSMENT & PLAN:  Evaluation for HFpEF - She has clinical symptoms of HFpEF in addition to a mildly increased BNP from her recent ER visit.  - TTE still pending; called  scheduler to have it scheduled  ASAP.  - Likely secondary to uncontrolled hypertension  - She has frequent hypotension associated with lightheadedness; will reduce losartan to 12.5mg  daily - Plan to start SGLT2I vs spironolactone in the future.  - Repeat BMP/BNP today.  - Reviewed TTE & labs from [facility] (5/24) as noted above.   2. Hypertension  - decrease losartan to 12.5mg  daily - Repeat BMP/BNP - Reviewed labs from [facility] in care everywhere  Erica Powers Advanced Heart Failure Mechanical Circulatory Support

## 2023-11-28 ENCOUNTER — Telehealth: Payer: Self-pay

## 2023-11-28 NOTE — Progress Notes (Addendum)
Pt called stating she has gained 3 lb and thinks she has fluid. Pt states her losartan was decreased. Notified pt that losartan wouldn't affect her fluid gain and pt should be taking Lasix prn for wt gain/fluid. Pt stated she's never picked up the rx and the pharmacy doesn't have her rx. Called and spoke with pharmacy. Pharmacy states they could not find the rx but took a verbal for Lasix 20 mg prn for 30 tabs and 5 refills. Called and notified pt medication should be ready for pick up in 30 mins to 1 hr. Pt aware to take Potassium when she takes her lasix. Pt should call to update [place] on sx Monday after taking Lasix. Pt agreed to call Monday with an update and had good understanding and no further questions.

## 2023-12-18 ENCOUNTER — Ambulatory Visit
Admission: RE | Admit: 2023-12-18 | Discharge: 2023-12-18 | Disposition: A | Discharge status: Home / Self Care | Payer: BC Managed Care – PPO | Source: Ambulatory Visit | Attending: Cardiology | Admitting: Cardiology

## 2023-12-18 DIAGNOSIS — E119 Type 2 diabetes mellitus without complications: Secondary | ICD-10-CM | POA: Insufficient documentation

## 2023-12-18 DIAGNOSIS — I340 Nonrheumatic mitral (valve) insufficiency: Secondary | ICD-10-CM | POA: Insufficient documentation

## 2023-12-18 DIAGNOSIS — I110 Hypertensive heart disease with heart failure: Secondary | ICD-10-CM | POA: Insufficient documentation

## 2023-12-18 DIAGNOSIS — I5032 Chronic diastolic (congestive) heart failure: Secondary | ICD-10-CM | POA: Insufficient documentation

## 2023-12-18 LAB — ECHOCARDIOGRAM COMPLETE
AR max vel: 3.54 cm2
AV Area VTI: 4.23 cm2
AV Area mean vel: 3.59 cm2
AV Mean grad: 3 mm[Hg]
AV Peak grad: 4.8 mm[Hg]
Ao pk vel: 1.1 m/s
Area-P 1/2: 2.92 cm2
S' Lateral: 2.9 cm

## 2024-01-06 ENCOUNTER — Other Ambulatory Visit: Payer: Self-pay

## 2024-01-08 ENCOUNTER — Encounter: Payer: Self-pay | Admitting: Gastroenterology

## 2024-01-08 ENCOUNTER — Ambulatory Visit (INDEPENDENT_AMBULATORY_CARE_PROVIDER_SITE_OTHER): Payer: BC Managed Care – PPO | Admitting: Gastroenterology

## 2024-01-08 VITALS — BP 128/74 | HR 60 | Temp 98.0°F | Ht 65.0 in | Wt 172.1 lb

## 2024-01-08 DIAGNOSIS — D509 Iron deficiency anemia, unspecified: Secondary | ICD-10-CM | POA: Diagnosis not present

## 2024-01-08 DIAGNOSIS — Z9884 Bariatric surgery status: Secondary | ICD-10-CM

## 2024-01-08 DIAGNOSIS — E538 Deficiency of other specified B group vitamins: Secondary | ICD-10-CM

## 2024-01-08 NOTE — Patient Instructions (Signed)
 1, Start taking Bariatric Multivitamin with mineral daily.

## 2024-01-08 NOTE — Progress Notes (Signed)
 [name], MD [address]  [address]  [place], [place] [address]  Main: [phone]  Fax: [phone]    Gastroenterology Consultation  Referring Provider:     Tobie Domino, MD Primary Care Physician:  Tobie Domino, MD Primary Gastroenterologist:  Dr. [name] Reason for Consultation: Chronic recurrent iron deficiency anemia        HPI:   Erica Powers is a 63 y.o. female referred by Dr. Tobie Domino, MD  for consultation & management of iron deficiency anemia.  Patient has history of Roux-en-Y gastric bypass in 2016, s/p small bowel obstruction, lysis of adhesions, history of H. pylori infection is seen in consultation for new diagnosis of iron deficiency anemia.  Patient reports ongoing fatigue for several months.,  Found to have hemoglobin of 10, MCV 81, ferritin 5.4, vitamin B12 153 in 03/2020.Patient denies melena, rectal bleeding.  She does report soreness in her upper stomach and abdominal bloating.  She denies any NSAID use.  She is taking multivitamin with minerals daily.  She was recommended to take oral iron 3 pills a day, resulted in severe constipation, currently taking 1 pill a day.  She is not on B12 supplements.  She takes over-the-counter vitamin D  supplements.  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  She tries to exercise regularly She did have history of DVT, s/p IVC filter placement which has been removed  Follow-up visit 12/06/2020 Patient denies any GI symptoms today.  She continues to take iron supplements as well as B12 injections along with vitamin D .  She reports that her energy levels have significantly improved.  She underwent upper endoscopy as well as colonoscopy with no source of anemia identified except for Roux-en-Y gastric bypass.  Follow-up visit 01/09/2024 Erica Powers is here for recurrence of iron deficiency anemia.  She has not been taking any iron  replacement therapy, B12 injections or bariatric multivitamins with minerals.  She was found to have hemoglobin of 9 through her PCPs office.  She was started on oral iron replacement and has been taking once daily which has resulted in constipation.  She is also taking B12 injections.  She reports that overall she is feeling better.  Denies any GI symptoms.  NSAIDs: None  Antiplts/Anticoagulants/Anti thrombotics: None  GI Procedures:  EGD and colonoscopy 09/29/2020 - Roux-en-Y gastrojejunostomy with gastrojejunal anastomosis characterized by healthy appearing mucosa. - Normal cardia and gastric body. - Normal gastroesophageal junction and esophagus. - No specimens collected.  - The examined portion of the ileum was normal. - The entire examined colon is normal. - The distal rectum and anal verge are normal on retroflexion view. - No specimens collected.  EGD in 2016 by Dr. 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 - BENIGN COLONIC MUCOSA WITH LYMPHOID AGGREGATE (X1 FRAGMENT). - BENIGN COLONIC MUCOSA (X2 FRAGMENTS). - NO DYSPLASIA OR MALIGNANCY.   Past Medical History:  Diagnosis Date   Acute right-sided low back pain    Anemia    Arthritis    Asthma    Complication of anesthesia    Woke during IVC filter removal   Diabetes mellitus without complication (HCC)    Difficult intubation    pt denies   DVT, lower extremity (HCC) 01/15/2015   right leg that went to lung  GERD (gastroesophageal reflux disease)    History of hiatal hernia    Hypertension    Osteoarthritis of lumbar spine, unspecified spinal osteoarthritis complication status    Pulmonary embolism (HCC) 01/15/2015   Rheumatoid factor positive    Sleep apnea     Past Surgical History:  Procedure Laterality Date   BREAST BIOPSY Bilateral    bilat bx done neg at [facility]    BREAST  SURGERY  2000   Reduction   CESAREAN SECTION     CHOLECYSTECTOMY     COLONOSCOPY N/A 04/08/2015   Procedure: COLONOSCOPY;  Surgeon: Rogelia Copping, MD;  Location: [facility];  Service: Gastroenterology;  Laterality: N/A;   COLONOSCOPY WITH PROPOFOL  N/A 09/29/2020   Procedure: COLONOSCOPY WITH PROPOFOL ;  Surgeon: Unk Corinn Skiff, MD;  Location: [facility];  Service: Endoscopy;  Laterality: N/A;   ESOPHAGOGASTRODUODENOSCOPY (EGD) WITH PROPOFOL  N/A 07/07/2015   Procedure: ESOPHAGOGASTRODUODENOSCOPY (EGD) WITH PROPOFOL ;  Surgeon: Rogelia Copping, MD;  Location: [facility];  Service: Endoscopy;  Laterality: N/A;   ESOPHAGOGASTRODUODENOSCOPY (EGD) WITH PROPOFOL  N/A 09/29/2020   Procedure: ESOPHAGOGASTRODUODENOSCOPY (EGD) WITH PROPOFOL ;  Surgeon: Unk Corinn Skiff, MD;  Location: [facility];  Service: Endoscopy;  Laterality: N/A;   GASTRIC ROUX-EN-Y N/A 11/01/2015   Procedure: LAPAROSCOPIC ROUX-EN-Y GASTRIC, HIATAL HERNIA REPAIR, EXTENSIVE LYSIS OF ADHESIONS;  Surgeon: Ozell DELENA Mano, MD;  Location: [facility];  Service: General;  Laterality: N/A;   HEEL SPUR SURGERY Bilateral    IVC FILTER INSERTION N/A 01/19/2019   Procedure: IVC FILTER INSERTION;  Surgeon: Marea Selinda RAMAN, MD;  Location: [facility];  Service: Cardiovascular;  Laterality: N/A;   IVC FILTER INSERTION N/A 04/15/2023   Procedure: IVC FILTER INSERTION;  Surgeon: Marea Selinda RAMAN, MD;  Location: [facility];  Service: Cardiovascular;  Laterality: N/A;   IVC FILTER REMOVAL  04/16/2019   IVC FILTER REMOVAL N/A 04/16/2019   Procedure: IVC FILTER REMOVAL;  Surgeon: Marea Selinda RAMAN, MD;  Location: [facility];  Service: Cardiovascular;  Laterality: N/A;   IVC FILTER REMOVAL N/A 07/29/2023   Procedure: IVC FILTER REMOVAL;  Surgeon: Marea Selinda RAMAN, MD;  Location: [facility];  Service: Cardiovascular;  Laterality: N/A;   KNEE ARTHROPLASTY Left 05/15/2023   Procedure: COMPUTER ASSISTED  TOTAL KNEE ARTHROPLASTY;  Surgeon: Mardee Lynwood SQUIBB, MD;  Location: [facility];  Service: Orthopedics;  Laterality: Left;   REDUCTION MAMMAPLASTY Bilateral 2000   ROTATOR CUFF REPAIR Right 2009   TONSILLECTOMY     TOTAL KNEE ARTHROPLASTY Right 01/21/2019   Procedure: TOTAL KNEE ARTHROPLASTY with navigation;  Surgeon: Mardee Lynwood SQUIBB, MD;  Location: [facility];  Service: Orthopedics;  Laterality: Right;   TUBAL LIGATION      Current Outpatient Medications:    acetaminophen  (TYLENOL ) 500 MG tablet, Take 500 mg by mouth every 6 (six) hours as needed., Disp: , Rfl:    albuterol  (PROVENTIL  HFA;VENTOLIN  HFA) 108 (90 Base) MCG/ACT inhaler, Inhale 1-2 puffs into the lungs every 4 (four) hours as needed for wheezing or shortness of breath. , Disp: , Rfl:    azelastine  (ASTELIN ) 0.1 % nasal spray, USE 1 TO 2 SPRAYS IN EACH NOSTRIL EVERY 12 HOURS AS NEEDED FOR ALLERGIES, Disp: , Rfl:    budesonide-formoterol  (SYMBICORT) 80-4.5 MCG/ACT inhaler, Inhale 2 puffs into the lungs 2 (two) times daily as needed., Disp: , Rfl:    Calcium  Carbonate-Vit D-Min (CALCIUM  600+D PLUS MINERALS) 600-400 MG-UNIT TABS, Take by mouth., Disp: , Rfl:  ferrous sulfate  325 (65 FE) MG EC tablet, Take by mouth., Disp: , Rfl:    fluticasone  (FLONASE ) 50 MCG/ACT nasal spray, Place 1 spray into both nostrils daily., Disp: , Rfl:    furosemide  (LASIX ) 20 MG tablet, Take 1 tablet (20 mg total) by mouth as needed., Disp: 30 tablet, Rfl: 5   losartan  (COZAAR ) 25 MG tablet, Take 0.5 tablets (12.5 mg total) by mouth daily., Disp: 45 tablet, Rfl: 3   Menthol , Topical Analgesic, (BIOFREEZE) 4 % GEL, Apply 1 Application topically as needed. Left knee, Disp: , Rfl:    montelukast  (SINGULAIR ) 10 MG tablet, Take 10 mg by mouth at bedtime., Disp: , Rfl:    potassium chloride  SA (KLOR-CON  M) 20 MEQ tablet, Take 1 tablet (20 mEq total) by mouth as needed. Only take when you take your lasix , Disp: 30 tablet, Rfl: 5   Semaglutide-Weight Management 2.4  MG/0.75ML SOAJ, Inject 2.4 mg into the skin once a week., Disp: , Rfl:    celecoxib  (CELEBREX ) 200 MG capsule, Take 1 capsule (200 mg total) by mouth 2 (two) times daily., Disp: 45 capsule, Rfl: 1   cyanocobalamin  (,VITAMIN B-12,) 1000 MCG/ML injection, Inject 1ml once a week for 4 weeks then once a month, Disp: 8 mL, Rfl: 0   cyclobenzaprine  (FLEXERIL ) 5 MG tablet, Take 1 tablet (5 mg total) by mouth 3 (three) times daily as needed., Disp: 15 tablet, Rfl: 0   enoxaparin  (LOVENOX ) 40 MG/0.4ML injection, Inject 0.4 mLs (40 mg total) into the skin daily for 14 days., Disp: 5.6 mL, Rfl: 0   Family History  Problem Relation Age of Onset   Diabetes Mother    Hypertension Mother    Breast cancer Neg Hx      Social History   Tobacco Use   Smoking status: Never   Smokeless tobacco: Never  Vaping Use   Vaping status: Never Used  Substance Use Topics   Alcohol use: Not Currently   Drug use: No    Allergies as of 01/08/2024 - Review Complete 01/08/2024  Allergen Reaction Noted   Codeine Itching 06/24/2015   Hydrocodone  Itching 03/30/2015    Review of Systems:    All systems reviewed and negative except where noted in HPI.   Physical Exam:  BP 128/74 (BP Location: Left Arm, Patient Position: Sitting, Cuff Size: Normal)   Pulse 60   Temp 98 F (36.7 C) (Oral)   Ht 5' 5 (1.651 m)   Wt 172 lb 2 oz (78.1 kg)   BMI 28.64 kg/m  No LMP recorded. Patient is postmenopausal.  General:   Alert,  Well-developed, well-nourished, pleasant and cooperative in NAD Head:  Normocephalic and atraumatic. Eyes:  Sclera clear, no icterus.   Conjunctiva pink. Ears:  Normal auditory acuity. Nose:  No deformity, discharge, or lesions. Mouth:  No deformity or lesions,oropharynx pink & moist. Neck:  Supple; no masses or thyromegaly. Lungs:  Respirations even and unlabored.  Clear throughout to auscultation.   No wheezes, crackles, or rhonchi. No acute distress. Heart:  Regular rate and rhythm; no  murmurs, clicks, rubs, or gallops. Abdomen:  Normal bowel sounds. Soft, nontender and non-distended without masses, hepatosplenomegaly or hernias noted.  No guarding or rebound tenderness.   Rectal: Not performed Msk:  Symmetrical without gross deformities. Good, equal movement & strength bilaterally. Pulses:  Normal pulses noted. Extremities:  No clubbing or edema.  No cyanosis. Neurologic:  Alert and oriented x3;  grossly normal neurologically. Skin:  Intact without significant lesions or  rashes. No jaundice. Psych:  Alert and cooperative. Normal mood and affect.  Imaging Studies: Reviewed  Assessment and Plan:   Erica Powers is a 63 y.o. pleasant African-American female with morbid obesity s/p Roux-en-Y gastric bypass in 2016, history of H. pylori infection, history of DVT s/p IVC filter placement and removal is seen in consultation for iron deficiency anemia and chronic fatigue, secondary to Roux-en-Y gastric bypass. EGD and colonoscopy were unremarkable  Recurrent iron deficiency anemia with no evidence of active GI bleed EGD and colonoscopy are unremarkable Continue oral iron every other day Recheck CBC and iron studies today  B12 deficiency Continue B12 shots once a month Recheck B12 levels today  History of Roux-en-Y gastric bypass Recommend bariatric multivitamin with minerals   Follow up in every 6 months   [name], MD

## 2024-01-09 ENCOUNTER — Telehealth: Payer: Self-pay

## 2024-01-09 LAB — CBC WITH DIFFERENTIAL/PLATELET
Basophils Absolute: 0 10*3/uL (ref 0.0–0.2)
Basos: 0 %
EOS (ABSOLUTE): 0 10*3/uL (ref 0.0–0.4)
Eos: 0 %
Hematocrit: 32.8 % — ABNORMAL LOW (ref 34.0–46.6)
Hemoglobin: 10.7 g/dL — ABNORMAL LOW (ref 11.1–15.9)
Immature Grans (Abs): 0 10*3/uL (ref 0.0–0.1)
Immature Granulocytes: 0 %
Lymphocytes Absolute: 1.7 10*3/uL (ref 0.7–3.1)
Lymphs: 31 %
MCH: 27.9 pg (ref 26.6–33.0)
MCHC: 32.6 g/dL (ref 31.5–35.7)
MCV: 85 fL (ref 79–97)
Monocytes Absolute: 0.4 10*3/uL (ref 0.1–0.9)
Monocytes: 8 %
Neutrophils Absolute: 3.2 10*3/uL (ref 1.4–7.0)
Neutrophils: 61 %
Platelets: 242 10*3/uL (ref 150–450)
RBC: 3.84 x10E6/uL (ref 3.77–5.28)
RDW: 16.4 % — ABNORMAL HIGH (ref 11.7–15.4)
WBC: 5.3 10*3/uL (ref 3.4–10.8)

## 2024-01-09 LAB — B12 AND FOLATE PANEL
Folate: 7.9 ng/mL (ref >3.0)
Vitamin B-12: 378 pg/mL (ref 232–1245)

## 2024-01-09 LAB — IRON,TIBC AND FERRITIN PANEL
Ferritin: 19 ng/mL (ref 15–150)
Iron Saturation: 15 % (ref 15–55)
Iron: 70 ug/dL (ref 27–139)
Total Iron Binding Capacity: 466 ug/dL — ABNORMAL HIGH (ref 250–450)
UIBC: 396 ug/dL — ABNORMAL HIGH (ref 118–369)

## 2024-01-09 NOTE — Telephone Encounter (Signed)
-----   Message from [facility] sent at 01/09/2024  8:36 AM EST ----- Also, continue monthly B12 shots  Recheck labs CBC, serum ferritin and B12 levels in 3 months

## 2024-01-09 NOTE — Telephone Encounter (Signed)
-----   Message from [facility] sent at 01/09/2024  8:35 AM EST ----- Anemia is improving, iron levels are still low, recommend to continue taking oral iron every other day long-term  RV

## 2024-01-09 NOTE — Telephone Encounter (Signed)
 Called patient and patient verbalized understanding of results. Put a reminder for 3 months

## 2024-02-11 ENCOUNTER — Ambulatory Visit
Admission: RE | Admit: 2024-02-11 | Discharge: 2024-02-11 | Disposition: A | Discharge status: Home / Self Care | Source: Ambulatory Visit | Attending: Family Medicine | Admitting: Family Medicine

## 2024-02-11 DIAGNOSIS — Z1231 Encounter for screening mammogram for malignant neoplasm of breast: Secondary | ICD-10-CM | POA: Insufficient documentation

## 2024-02-24 ENCOUNTER — Telehealth: Payer: Self-pay | Admitting: Cardiology

## 2024-02-24 NOTE — Telephone Encounter (Signed)
 Called to sched recall

## 2024-03-12 ENCOUNTER — Telehealth: Payer: Self-pay | Admitting: Cardiology

## 2024-03-12 NOTE — Telephone Encounter (Signed)
 Called to sched recall

## 2024-04-07 ENCOUNTER — Telehealth: Payer: Self-pay

## 2024-04-07 DIAGNOSIS — D509 Iron deficiency anemia, unspecified: Secondary | ICD-10-CM

## 2024-04-07 DIAGNOSIS — E538 Deficiency of other specified B group vitamins: Secondary | ICD-10-CM

## 2024-04-07 NOTE — Telephone Encounter (Signed)
-----   Message from [facility] sent at 01/09/2024 10:33 AM EST ----- CBC, serum ferritin and B12 levels in 3 months

## 2024-04-07 NOTE — Telephone Encounter (Signed)
 Called patient left a detail message to inform patient that lab work was order and where to go. Order the lab work. Sent mychart message

## 2024-04-21 ENCOUNTER — Encounter (INDEPENDENT_AMBULATORY_CARE_PROVIDER_SITE_OTHER): Payer: Self-pay

## 2024-05-27 ENCOUNTER — Other Ambulatory Visit: Payer: Self-pay | Admitting: Cardiology

## 2024-06-08 ENCOUNTER — Telehealth: Payer: Self-pay | Admitting: Cardiology

## 2024-06-08 NOTE — Telephone Encounter (Signed)
 Called to sched recall

## 2024-08-20 ENCOUNTER — Telehealth: Payer: Self-pay | Admitting: Cardiology

## 2024-09-24 NOTE — Therapy (Unsigned)
 OUTPATIENT PHYSICAL THERAPY EVALUATION   Patient Name: Erica Powers MRN: [id number] DOB:[birth year], 63 y.o., female Today's Date: 10/06/2024  END OF SESSION:  PT End of Session - 10/06/24 1814     Visit Number 1    Number of Visits 17    Date for Recertification  12/29/24    Authorization Type [employer] reporting period from 10/06/2024    Authorization Time Period VL 60 PT/OT-43 remain    Authorization - Visit Number 1    Authorization - Number of Visits 43    Progress Note Due on Visit 10    PT Start Time 1655    PT Stop Time 1740    PT Time Calculation (min) 45 min    Activity Tolerance Patient tolerated treatment well    Behavior During Therapy WFL for tasks assessed/performed          Past Medical History:  Diagnosis Date   Acute right-sided low back pain    Anemia    Arthritis    Asthma    Complication of anesthesia    Woke during IVC filter removal   Diabetes mellitus without complication (HCC)    Difficult intubation    pt denies   DVT, lower extremity (HCC) 01/15/2015   right leg that went to lung   GERD (gastroesophageal reflux disease)    History of hiatal hernia    Hypertension    Osteoarthritis of lumbar spine, unspecified spinal osteoarthritis complication status    Pulmonary embolism (HCC) 01/15/2015   Rheumatoid factor positive    Sleep apnea    Past Surgical History:  Procedure Laterality Date   BREAST BIOPSY Bilateral    [facility] bx [facility] neg [facility]    BREAST SURGERY  2000   Reduction   CESAREAN SECTION     CHOLECYSTECTOMY     COLONOSCOPY N/A 04/08/2015   Procedure: COLONOSCOPY;  Surgeon: Rogelia Copping, MD;  Location: [facility];  Service: Gastroenterology;  Laterality: N/A;   COLONOSCOPY WITH PROPOFOL  N/A 09/29/2020   Procedure: COLONOSCOPY WITH PROPOFOL ;  Surgeon: Unk Corinn Skiff, MD;  Location: [facility];  Service: Endoscopy;  Laterality: N/A;   ESOPHAGOGASTRODUODENOSCOPY (EGD) WITH PROPOFOL  N/A  07/07/2015   Procedure: ESOPHAGOGASTRODUODENOSCOPY (EGD) WITH PROPOFOL ;  Surgeon: Rogelia Copping, MD;  Location: [facility];  Service: Endoscopy;  Laterality: N/A;   ESOPHAGOGASTRODUODENOSCOPY (EGD) WITH PROPOFOL  N/A 09/29/2020   Procedure: ESOPHAGOGASTRODUODENOSCOPY (EGD) WITH PROPOFOL ;  Surgeon: Unk Corinn Skiff, MD;  Location: [facility];  Service: Endoscopy;  Laterality: N/A;   GASTRIC ROUX-EN-Y N/A 11/01/2015   Procedure: LAPAROSCOPIC ROUX-EN-Y GASTRIC, HIATAL HERNIA REPAIR, EXTENSIVE LYSIS OF ADHESIONS;  Surgeon: Ozell DELENA Mano, MD;  Location: [facility] OR[facility];  Service: General;  Laterality: N/A;   HEEL SPUR SURGERY Bilateral    IVC FILTER INSERTION N/A 01/19/2019   Procedure: IVC FILTER INSERTION;  Surgeon: Marea Selinda RAMAN, MD;  Location: [facility] INV[facility] CV LAB;  Service: Cardiovascular;  Laterality: N/A;   IVC FILTER INSERTION N/A 04/15/2023   Procedure: IVC FILTER INSERTION;  Surgeon: Marea Selinda RAMAN, MD;  Location: [facility] CV [facility];  Service: Cardiovascular;  Laterality: N/A;   IVC FILTER REMOVAL  04/16/2019   IVC FILTER REMOVAL N/A 04/16/2019   Procedure: IVC FILTER REMOVAL;  Surgeon: Marea Selinda RAMAN, MD;  Location: [facility] CV [facility];  Service: Cardiovascular;  Laterality: N/A;   IVC FILTER REMOVAL N/A 07/29/2023   Procedure: IVC FILTER REMOVAL;  Surgeon: Marea Selinda RAMAN, MD;  Location: [facility]  CV LAB;  Service: Cardiovascular;  Laterality: N/A;   KNEE ARTHROPLASTY Left 05/15/2023   Procedure: COMPUTER ASSISTED TOTAL KNEE ARTHROPLASTY;  Surgeon: Mardee Lynwood SQUIBB, MD;  Location: [facility] OR[facility];  Service: Orthopedics;  Laterality: Left;   REDUCTION MAMMAPLASTY Bilateral 2000   ROTATOR CUFF REPAIR Right 2009   TONSILLECTOMY     TOTAL KNEE ARTHROPLASTY Right 01/21/2019   Procedure: TOTAL KNEE ARTHROPLASTY with navigation;  Surgeon: Mardee Lynwood SQUIBB, MD;  Location: [facility] OR[facility];  Service: Orthopedics;  Laterality: Right;   TUBAL LIGATION     Patient Active Problem List   Diagnosis Date  Noted   Esophageal dysphagia 01/04/2022   Iron deficiency 01/04/2022   Vomiting 01/04/2022   Constipation 03/24/2021   Bloating 03/22/2021   Nausea 03/22/2021   Iron deficiency anemia    BMI 33.0-33.9,adult 09/08/2020   Panniculitis 03/23/2020   Generalized abdominal pain 10/08/2019   History of Roux-en-Y gastric bypass 09/16/2019   Chronic shoulder bursitis, right 05/06/2019   Chronic right shoulder pain 04/20/2019   S/P IVC filter 03/03/2019   Status post total left knee replacement 01/21/2019   DDD (degenerative disc disease), lumbar 03/31/2018   Rheumatoid factor positive 03/31/2018   Swelling of limb 07/12/2017   Varicose veins of leg with pain, bilateral 07/12/2017   Pulmonary emboli (HCC) 11/15/2015   Positive Helicobacter pylori serology 11/03/2015   Bariatric surgery status 11/01/2015   Primary osteoarthritis of left knee 09/27/2015   Primary osteoarthritis of right knee 09/27/2015   Diabetes mellitus, type 2 (HCC) 07/13/2015   Arthritis, degenerative 07/13/2015   Hypertensive disorder 06/15/2015   Asthma 12/07/2013   Carpal tunnel syndrome 12/07/2013   Essential (primary) hypertension 09/14/2009    PCP: Tobie Domino, MD  REFERRING PROVIDER: Janith Drivers, DO  REFERRING DIAG: left lumbar radiculitis  Rationale for Evaluation and Treatment: Rehabilitation  THERAPY DIAG:  Other low back pain  Radiculopathy, lumbar region  Difficulty in walking, not elsewhere classified  ONSET DATE: following L TKA on 05/15/2023  SUBJECTIVE:                                                                                                                                                                                           SUBJECTIVE STATEMENT: Patient is here for a physical therapy evaluation of left lumbar radiculitis using Mechanical Diagnosis and Therapy (MDT/McKenzie) approach at the request of Dr.Kevin Carneiro, DO of the [facility]. See below for further  history.   PERTINENT HISTORY:  Patient is a 63 y.o. female who presents to outpatient physical therapy with a referral for medical diagnosis left lumbar radiculitis. This patient's chief complaints consist of  left low back pain radiating with pain and numbness into left lower leg and difficulty bending to the right leading to the following functional deficits: difficulty with usual activities including working, standing up from sitting, standing, prolonged sitting, hiking, biking, sleeping, lifting, self care, housework, cooking, leisure activities. Relevant past medical history and comorbidities include the following: she has Diabetes mellitus, type 2 (HCC); Arthritis, degenerative; Bariatric surgery status; Pulmonary emboli (HCC); Swelling of limb; Varicose veins of leg with pain, bilateral; Positive Helicobacter pylori serology; Asthma; Carpal tunnel syndrome; DDD (degenerative disc disease), lumbar; Essential (primary) hypertension; Hypertensive disorder; Primary osteoarthritis of left knee; Primary osteoarthritis of right knee; Rheumatoid factor positive; Status post total left knee replacement; S/P IVC filter; BMI 33.0-33.9,adult; Chronic right shoulder pain; Chronic shoulder bursitis, right; History of Roux-en-Y gastric bypass; Panniculitis; Iron deficiency anemia; Esophageal dysphagia; Generalized abdominal pain; Iron deficiency; Bloating; Constipation; Nausea; and Vomiting on their problem list. she  has a past medical history of Acute right-sided low back pain, Anemia, Arthritis, Asthma, Complication of anesthesia, Diabetes mellitus without complication (HCC), DVT, lower extremity (HCC) (01/15/2015), GERD (gastroesophageal reflux disease), History of hiatal hernia, Hypertension, Osteoarthritis of lumbar spine, unspecified spinal osteoarthritis complication status, Pulmonary embolism (HCC) (01/15/2015), Rheumatoid factor positive, and Sleep apnea. she  has a past surgical history that includes Heel spur  surgery (Bilateral); Breast surgery (2000); Rotator cuff repair (Right, 2009); Tonsillectomy; Tubal ligation; Gastric Roux-En-Y (N/A, 11/01/2015); Reduction mammaplasty (Bilateral, 2000); Cholecystectomy; IVC FILTER INSERTION (N/A, 01/19/2019); Total knee arthroplasty (Right, 01/21/2019); IVC FILTER REMOVAL (04/16/2019); IVC FILTER REMOVAL (N/A, 04/16/2019); Breast biopsy (Bilateral); IVC FILTER INSERTION (N/A, 04/15/2023); Cesarean section; Knee Arthroplasty (Left, 05/15/2023); and IVC FILTER REMOVAL (N/A, 07/29/2023). Patient denies hx of cancer, stroke, seizures, lung problems, heart problems, unexplained weight loss, unexplained changes in bowel or bladder problems, unexplained stumbling or dropping things, and spinal surgery  Exercise history: Biking 20 miles a week with a group prior to L TKA, hiking  PAIN: Are you having pain? Yes NPRS: Current: 5/10,  Best: 3/10, Worst: 8/10. Pain location: pain left lower back, left glute, down back of leg, intermittently to medial lower leg. Numbness in L lower leg (stops mid lower leg). Pain description: pinching in the left lumbar spine radiating to the L leg, Numbness in L lower leg (stops mid lower leg). Aggravating factors: see below Relieving factors: see below   PRECAUTIONS: None  WEIGHT BEARING RESTRICTIONS: No  FALLS:  Has patient fallen in last 6 months? Yes. Number of falls 1 when she had her son over and she wanted to clean under her matress. She tripped over the the bed frame when stepping in to vacuum.   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  Leisure Activities: Ride bike 20 miles per week (knee limiting),  hiking,  Functional Limitation for Present Episode: difficulty with usual activities including working, standing up from sitting, standing, prolonged sitting, hiking, biking, sleeping, lifting, self care, housework, cooking, leisure activities. Outcome / Screening Score:  MODIFIED OSWESTRY DISABILITY SCALE  Date: 10/06/2024 Score  Pain intensity 2 =  Pain medication provides me with complete relief from pain.  2. Personal care (washing, dressing, etc.) 0 =  I can take care of myself normally without causing increased pain.  3. Lifting 3 = Pain prevents me from lifting heavy weights, but I can manage light to medium weights if they are conveniently positioned  4. Walking 1 = Pain prevents me from walking more than 1 mile.  5. Sitting 2 =  Pain prevents me from sitting more than 1 hour.  6. Standing 2 =  Pain prevents me from standing more than 1 hour  7. Sleeping 2 =  Even when I take pain medication, I sleep less than 6 hours  8. Social Life 2 = Pain prevents me from participating in more energetic activities (eg. sports, dancing).  9. Traveling 2 =  My pain restricts my travel over 2 hours.  10. Employment/ Homemaking 1 = My normal homemaking/job activities increase my pain, but I can still perform all that is required of me  Total 17/50 (34%)   Minimally Clinically Important Difference (MCID) = 12.8%   NPRS (0-10): Current: 5/10,  Best: 3/10, Worst: 8/10. Present Symptoms: pain left lower back, left glute, down back of leg, intermittently to medial lower leg. Numbness in L lower leg (stops mid lower leg).  Present Since: 9-10 months ago, worsening Commenced As a Result of: last year pt had L TKA. After she started feeling better she noticed she had a bad limp. She was leaning to the left and has a catch in her left side and going down her left side. This has been intensifying and people notice she has a limp. It is painful. She still has to go so she cannot take pain meds and is taking a lot  of ibuprofen so she switched to [facility] powder, which helps her get through the day. If she is walking down the hallway at work is hard and she is catching herself holding onto the wall. She used to be a cyclist 20 miles a day before she had the knee replacement. She started having pain in the back of her left knee. Came on gradually.  Symptoms at Onset: back and thigh Constant Symptoms: back and thigh Intermittent Symptoms: leg  Worse - Bending: Sometimes - Sitting: Sometimes - Rising: Sometimes - Standing: Always - Walking: Always - Lying: Sometimes - AM: Sometimes - As the Day Progresses: Sometimes - PM: Sometimes - When Still: Sometimes - On the Move: Always   Better - Bending: Never - Sitting: Sometimes - Standing: Sometimes - Walking: Sometimes - Lying: Sometimes - AM: Sometimes - As the Day Progresses: Sometimes - PM: Sometimes - When Still: Sometimes - On the Move: Sometimes - Other: BC powder, ibuprofen, heating pad vera gel, biofreeze, knee brace  Sleep and Rest  Disturbed Sleep: Yes: wakes her up at 3 am, sometimes can change positions and go back to sleep Sleeping Postures: side L, but she cannot lately tossing and turning (wakes on the left or right side).  Surface: firm  Medical History  Previous Spinal History: has had this problem before but it usually goes away. Always on the left side. 3 times in the past. All for no apparent reason.  Previous Treatments:  left L5\S1 Transforaminal Epidural Steroid Injection under Fluoroscopy on 08/17/24 (helped for a few days).   SPECIFIC QUESTIONS - Cough: No, Sneeze: No, Strain: No - Bladder/Bowel: Normal - Gait: disrupted  Medications: see chart General Health/Comorbidities:  Diabetes mellitus, type 2 (HCC); Arthritis, degenerative; Bariatric surgery status; Pulmonary emboli (HCC); Swelling of limb; Varicose veins of leg with pain, bilateral; Positive  Helicobacter pylori serology; Asthma; Carpal tunnel syndrome; DDD  (degenerative disc disease), lumbar; Essential (primary) hypertension; Hypertensive disorder; Primary osteoarthritis of left knee; Primary osteoarthritis of right knee; Rheumatoid factor positive; Status post total left knee replacement; S/P IVC filter; BMI 33.0-33.9,adult; Chronic right shoulder pain; Chronic shoulder bursitis, right; History of Roux-en-Y gastric bypass; Panniculitis; Iron deficiency anemia; Esophageal dysphagia; Generalized abdominal pain; Iron deficiency; Bloating; Constipation; Nausea; and Vomiting on their problem list. she  has a past medical history of Acute right-sided low back pain, Anemia, Arthritis, Asthma, Complication of anesthesia, Diabetes mellitus without complication (HCC), DVT, lower extremity (HCC) (01/15/2015), GERD (gastroesophageal reflux disease), History of hiatal hernia, Hypertension, Osteoarthritis of lumbar spine, unspecified spinal osteoarthritis complication status, Pulmonary embolism (HCC) (01/15/2015), Rheumatoid factor positive, and Sleep apnea. she  has a past surgical history that includes Heel spur surgery (Bilateral); Breast surgery (2000); Rotator cuff repair (Right, 2009); Tonsillectomy; Tubal ligation; Gastric Roux-En-Y (N/A, 11/01/2015); Reduction mammaplasty (Bilateral, 2000); Cholecystectomy; IVC FILTER INSERTION (N/A, 01/19/2019); Total knee arthroplasty (Right, 01/21/2019); IVC FILTER REMOVAL (04/16/2019); IVC FILTER REMOVAL (N/A, 04/16/2019); Breast biopsy (Bilateral); IVC FILTER INSERTION (N/A, 04/15/2023); Cesarean section; Knee Arthroplasty (Left, 05/15/2023); and IVC FILTER REMOVAL (N/A, 07/29/2023). Patient denies hx of cancer, stroke, seizures, lung problems, heart problems, diabetes, unexplained weight loss, unexplained changes in bowel or bladder problems, unexplained stumbling or dropping things, and spinal surgery  Recent/Relevant Surgery: No History of Cancer: No Unexplained Weight Loss: No History of Trauma: No Imaging: Yes:   LE EMG/NCV  test report from 09/02/2024:  Conclusions: Abnormal study. Findings suggestive of chronic left L5 > S1 radiculopathy.   Lumbar MRI report from 07/21/2024:  EXAM: Magnetic resonance imaging, spinal canal and contents, lumbar, without contrast material.  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   FINDINGS:  Bone marrow signal intensity is normal. The visualized cord is unremarkable and the conus medullaris ends at a normal level.   The vertebral bodies are normally aligned. Multilevel mild disc desiccation, worst at L4-L5. Bulky bridging anterior osteophytes.   Multilevel severe facet and ligamentum flavum hypertrophy.   L1-L2: No herniation. No spinal canal narrowing. No neural foraminal narrowing.   L2-L3: No herniation. No spinal canal narrowing. No neural foraminal narrowing.   L3-L4: Circumferential disc bulge, severe bilateral facet arthropathy, and ligamentum flavum thickening with mild spinal canal stenosis. Mild left neural foraminal stenosis.   L4-L5: Circumferential disc bulge, severe bilateral facet arthropathy, and ligamentum flavum thickening with mild spinal canal stenosis. Also narrowing of the bilateral subarticular zones with abutment and possible impingement of the bilateral descending L5 nerve roots (6:87). Moderate right and mild left neural foraminal stenosis.   L5-S1: Circumferential disc bulge and facet arthropathy with narrowing of the subarticular zones with possible impingement of the descending S1 nerve roots, right greater than left (6:99-101). Severe left greater than right neural foraminal stenosis.   The  paraspinal tissues are within normal limits.   For the purposes of this dictation, the lowest well formed intervertebral disc space is assumed to be the L5-S1 level, and there are presumed to be five lumbar-type vertebral bodies.   IMPRESSION:   1. Severe degenerative changes of the lumbar spine with no greater than  mild central spinal canal stenosis from L3-L5. However, there is bilateral subarticular zone narrowing at L4-L5 and L5-S1 with possible impingement of the descending L5 nerve roots and S1 nerve roots, as detailed in findings.   2. Severe left greater than right L5-S1 neural foraminal stenosis. Additional multilevel stenosis described in the body of the report.   3. Diffuse idiopathic skeletal hyperostosis.   Patient Goals/Expectations: To really work on walking and get me back up, to loosen up my back and knee, so I can get back mobile and moving like I was.   OBJECTIVE  EXAMINATION  Sitting Posture: neutral, slight R shift Change of Posture Effect: better Standing Posture: neutral Lateral Shift: nil Shift Relevance: No  Other Observations/Functional Baselines: ambulates with antalgic gait favoring L  Neurological  Motor Deficit:  10x single leg heel raise with B UE handheld support L with increased pain in back of knee and much lower heel raise, more difficulty with balance, but able to perform.  Reflexes:  Achilles: B 0+ Quads: B 1+ (more consistent on left) Neurodynamic Tests:  Neural Tension Testing Slump Flexion based  R  = negative L  = positive, sensitive to foot position  Movement Loss Movement Loss Symptoms  Flexion nil Catch in L low back when returning, fingers to mid shins (normal for pt).   Extension maj Feels good but stretch in the left  Side Gliding R maj Catch in L low back  Side Gliding L mod Pain in R low back     Repeated Motions Testing 5-6/10 to left knee Test Movement Symptom During Symptom After Mechanical Response Key Functional  Test  REIS 1x10 peripheralizing no worse    REIL 1x10 (partial ROM) increases L glute each rep no worse    R side glide 1x10 peripheralizing worse  Walking worse  L side glide 1x10 Catch with return better than after R side glide  Walking slightly better, still limping  REIS with back against plinth 1x10 peripheralizing each rep better  Improved slightly                TREATMENT                                                                                                                           Therapeutic exercise: therapeutic exercises that incorporate ONE parameter at one or more areas of the body to centralize symptoms, develop strength and endurance, range of motion, and flexibility required for successful completion of functional activities. REIS 1x10 REIS with back against plinth 1x10 Education on diagnosis, prognosis, POC, anatomy and physiology of current condition. Education on peripheralization, centralization, POC, avoiding flexion, avoiding nerve tension positions. Education on HEP including handout   Trial of sitting with lumbar towel roll and education on how to perform this at home.   Pt required multimodal cuing for proper technique and to facilitate improved neuromuscular control, strength, range of motion, and functional ability resulting in improved performance and  form.  PATIENT EDUCATION:  Education details: educated on peripheralization, centralization, POC, avoiding flexion, avoiding nerve tension positions, HEP Person educated: Patient Education method: Programmer, Multimedia, Demonstration, and Handouts Education comprehension: verbalized understanding, returned demonstration, and needs further education  HOME EXERCISE PROGRAM: Access Code: [email or web address] URL: [email or web address] Date: 10/07/2024 Prepared by: Camie Cleverly  Exercises - Seated Correct Posture  - Standing Lumbar Extension with Counter  - 1 sets - 15 reps - 1 second hold - every 2 hours  frequency  ASSESSMENT:  CLINICAL IMPRESSION: Patient is a 63 y.o. female referred to outpatient physical therapy with a medical diagnosis of left lumbar radiculitis and request for PT evaluation using Mechanical Diagnosis and Therapy (MDT/McKenzie) who presents with signs and symptoms consistent with chronic left sided low back pain with L radiculopathy. MDT provisional classification of lumbar derangement syndrome with extension preference vs flexion preference vs OTHER. Patient presents with significant pain, ROM, joint stiffness, neural tension, paresthesia, muscle performance (strength/power/endurance), gait, posture, balance, and activity tolerance impairments that are limiting ability to complete usual activities including working, standing up from sitting, standing, prolonged sitting, hiking, biking, sleeping, lifting, self care, housework, cooking, leisure activities without difficulty. Patient will benefit from skilled physical therapy intervention to address current body structure impairments and activity limitations to improve function and work towards goals set in current POC in order to return to prior level of function or maximal functional improvement.   MDT Assessment Provisional Classification: Derangement (vs other) Directional Preference: Extension   OBJECTIVE IMPAIRMENTS: Abnormal gait, decreased activity tolerance, decreased balance, decreased endurance, decreased knowledge of condition, decreased mobility, difficulty walking, decreased ROM, decreased strength, hypomobility, increased muscle spasms, impaired flexibility, improper body mechanics, postural dysfunction, and pain.   ACTIVITY LIMITATIONS: carrying, lifting, bending, sitting, standing, squatting, sleeping, stairs, transfers, bed mobility, and locomotion level  PARTICIPATION LIMITATIONS: meal prep, cleaning, laundry, interpersonal relationship, driving, shopping, community activity, occupation, and cycling,  hiking  PERSONAL FACTORS: Age, Past/current experiences, Time since onset of injury/illness/exacerbation, and 3+ comorbidities:  Diabetes mellitus, type 2 (HCC); Arthritis, degenerative; Bariatric surgery status; Pulmonary emboli (HCC); Swelling of limb; Varicose veins of leg with pain, bilateral; Positive Helicobacter pylori serology; Asthma; Carpal tunnel syndrome; DDD (degenerative disc disease), lumbar; Essential (primary) hypertension; Hypertensive disorder; Primary osteoarthritis of left knee; Primary osteoarthritis of right knee; Rheumatoid factor positive; Status post total left knee replacement; S/P IVC filter; BMI 33.0-33.9,adult; Chronic right shoulder pain; Chronic shoulder bursitis, right; History of Roux-en-Y gastric bypass; Panniculitis; Iron deficiency anemia; Esophageal dysphagia; Generalized abdominal pain; Iron deficiency; Bloating; Constipation; Nausea; and Vomiting on their problem list. she  has a past medical history of Acute right-sided low back pain, Anemia, Arthritis, Asthma, Complication of anesthesia, Diabetes mellitus without complication (HCC), DVT, lower extremity (HCC) (01/15/2015), GERD (gastroesophageal reflux disease), History of hiatal hernia, Hypertension, Osteoarthritis of lumbar spine, unspecified spinal osteoarthritis complication status, Pulmonary embolism (HCC) (01/15/2015), Rheumatoid factor positive, and Sleep apnea. 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   REHAB POTENTIAL: Good  CLINICAL DECISION MAKING: Evolving/moderate complexity  EVALUATION COMPLEXITY: Moderate   GOALS: Goals reviewed with patient? No  SHORT TERM GOALS: Target date: 10/20/2024  Patient will be independent with initial home exercise program for self-management of symptoms. Baseline: Initial HEP provided at IE (10/06/24); Goal status: INITIAL  LONG TERM GOALS: Target date: 12/29/2024  Patient will be independent with a long-term home exercise program for self-management of symptoms.  Baseline: Initial HEP provided at IE (10/06/24); Goal status: INITIAL  2.  Patient will demonstrate improved Modified Oswestry Disability Index (mODI) to equal or less than 10% to demonstrate improvement in overall condition and self-reported functional ability.  Baseline: 34% (10/06/24); Goal status: INITIAL  3.  Patient will demonstrate the ability to ambulate 1500 feet without antalgic gait or increased pain during 6 Minute Walk Test to demonstrate improved community ambulation towards age matched norma of 1765 feet for healthy adult female and to help her perform work and leisure activities with less difficulty.   Baseline: currently antalgic gait walking short distances (< 100 feet) in clinic with complaint of pain and difficulty walking down the hallway during work activities (10/06/24); Goal status: INITIAL  4.  Patient will demonstrate full available lumbar AROM without increased symptoms except intermittent end range discomfort to improve her ability to complete ADLs, work, and leisure activities.  Baseline: painful and limited - see objective (10/06/24); Goal status: INITIAL  5.  Patient will report NPRS equal or less than 3/10 during functional activities during the last 2 weeks to improve their abilitly to complete community, work and/or recreational activities with less limitation. Baseline: 8/10  (10/06/24); Goal status: INITIAL   PLAN:  PT FREQUENCY: 2x/week  PT DURATION: 8-12 weeks  PLANNED INTERVENTIONS: 97164- PT Re-evaluation, 97750- Physical Performance Testing, 97110-Therapeutic exercises, 97530- Therapeutic activity, [id number]- Neuromuscular re-education, 97535- Self Care, 02859- Manual therapy, [phone]- Gait training, [phone]- Aquatic Therapy, [phone]- Electrical stimulation (unattended), [phone] (1-2 muscles), [id number] (3+ muscles)- Dry Needling, Patient/Family education, Balance training, Stair training, Cryotherapy, and Moist heat.  PLAN FOR NEXT SESSION:   MDT Plan  PRINCIPLES OF MANAGEMENT Education: educated on peripheralization, centralization, POC, avoiding flexion, avoiding nerve tension positions, HEP Exercise Type: repeated lumbar extension in standing with pelvis blocked on counter  Frequency: 1x15 reps or until improvement stops every 2 hours while awake  Other Exercises / Interventions: use lumbar roll at all times when sitting.  Management Goals: reduce derangement, maintain reduction, recover function, and learn prophylaxis.   Camie SAUNDERS. Juli, PT, DPT, Cert. MDT 10/06/24, 6:28 PM  [facility] [address] [place], [place] [address] P: [phone] I F: [phone]

## 2024-09-28 NOTE — Progress Notes (Unsigned)
 Cardiology Office Note  Date:  09/29/2024   ID:  Alexsa, Flaum 13-Mar-1961, MRN [id number]  PCP:  Tobie Domino, MD   Chief Complaint  Patient presents with   New Patient (Initial Visit)    Referred by Dr Tobie for Heart murmur/ Dyspnea on exertion/ bilateral legs edema.    HPI:  ARIADNE RISSMILLER is a 63 y.o. female with past medical history of: PE, DVT with IVC filter now extracted Asthma, followed by pulmonary Seen in the ER October 22, 2019 for worsening lower extreme edema and shortness of breath Who presents by referral from Dr. Domino Tobie for consultation of her heart murmur, shortness of breath on exertion, leg edema  Works as a therapist, music Active at baseline Nov 2024 developed worsening shortness of breath, leg swelling Given anxiety concerning leg swelling she went to the emergency room She was started on losartan  for blood pressure, Lasix  20 x 3 days She is continue Lasix  daily with immediate improvement in leg swelling and shortness of breath  Some episodes of orthostatic hypotension with lightheadedness  Lab work reviewed Hemoglobin 10.7  Echocardiogram January 2025 Ejection fraction 60%, normal RV size and function Normal right heart pressures No significant valvular heart disease  Recently reports she has been doing well  currently not taking lasix , no edema Takes potassium daily Lab work reviewed, potassium 4.0  EKG personally reviewed by myself on todays visit EKG Interpretation Date/Time:  Tuesday September 29 2024 15:57:50 EDT Ventricular Rate:  57 PR Interval:  186 QRS Duration:  68 QT Interval:  420 QTC Calculation: 408 R Axis:   24  Text Interpretation: Sinus bradycardia When compared with ECG of 22-Oct-2023 17:26, No significant change was found Confirmed by Perla Lye [phone]) on 09/29/2024 4:03:05 PM    PMH:   has a past medical history of Acute right-sided low back pain, Anemia, Arthritis, Asthma, Complication of anesthesia,  Diabetes mellitus without complication (HCC), Difficult intubation, DVT, lower extremity (HCC) (01/15/2015), GERD (gastroesophageal reflux disease), History of hiatal hernia, Hypertension, Osteoarthritis of lumbar spine, unspecified spinal osteoarthritis complication status, Pulmonary embolism (HCC) (01/15/2015), Rheumatoid factor positive, and Sleep apnea.  PSH:    Past Surgical History:  Procedure Laterality Date   BREAST BIOPSY Bilateral    [facility] neg [facility]    BREAST SURGERY  2000   Reduction   CESAREAN SECTION     CHOLECYSTECTOMY     COLONOSCOPY N/A 04/08/2015   Procedure: COLONOSCOPY;  Surgeon: Rogelia Copping, MD;  Location: [facility];  Service: Gastroenterology;  Laterality: N/A;   COLONOSCOPY WITH PROPOFOL  N/A 09/29/2020   Procedure: COLONOSCOPY WITH PROPOFOL ;  Surgeon: Unk Corinn Skiff, MD;  Location: [facility];  Service: Endoscopy;  Laterality: N/A;   ESOPHAGOGASTRODUODENOSCOPY (EGD) WITH PROPOFOL  N/A 07/07/2015   Procedure: ESOPHAGOGASTRODUODENOSCOPY (EGD) WITH PROPOFOL ;  Surgeon: Rogelia Copping, MD;  Location: [facility];  Service: Endoscopy;  Laterality: N/A;   ESOPHAGOGASTRODUODENOSCOPY (EGD) WITH PROPOFOL  N/A 09/29/2020   Procedure: ESOPHAGOGASTRODUODENOSCOPY (EGD) WITH PROPOFOL ;  Surgeon: Unk Corinn Skiff, MD;  Location: [facility];  Service: Endoscopy;  Laterality: N/A;   GASTRIC ROUX-EN-Y N/A 11/01/2015   Procedure: LAPAROSCOPIC ROUX-EN-Y GASTRIC, HIATAL HERNIA REPAIR, EXTENSIVE LYSIS OF ADHESIONS;  Surgeon: Ozell DELENA Mano, MD;  Location: [facility];  Service: General;  Laterality: N/A;   HEEL SPUR SURGERY Bilateral    IVC FILTER INSERTION N/A 01/19/2019   Procedure: IVC FILTER INSERTION;  Surgeon: Marea Selinda RAMAN, MD;  Location: [facility];  Service: Cardiovascular;  Laterality: N/A;   IVC FILTER INSERTION N/A 04/15/2023   Procedure: IVC FILTER INSERTION;  Surgeon: Marea Selinda RAMAN, MD;  Location: [facility];   Service: Cardiovascular;  Laterality: N/A;   IVC FILTER REMOVAL  04/16/2019   IVC FILTER REMOVAL N/A 04/16/2019   Procedure: IVC FILTER REMOVAL;  Surgeon: Marea Selinda RAMAN, MD;  Location: [facility];  Service: Cardiovascular;  Laterality: N/A;   IVC FILTER REMOVAL N/A 07/29/2023   Procedure: IVC FILTER REMOVAL;  Surgeon: Marea Selinda RAMAN, MD;  Location: [facility];  Service: Cardiovascular;  Laterality: N/A;   KNEE ARTHROPLASTY Left 05/15/2023   Procedure: COMPUTER ASSISTED TOTAL KNEE ARTHROPLASTY;  Surgeon: Mardee Lynwood SQUIBB, MD;  Location: [facility];  Service: Orthopedics;  Laterality: Left;   REDUCTION MAMMAPLASTY Bilateral 2000   ROTATOR CUFF REPAIR Right 2009   TONSILLECTOMY     TOTAL KNEE ARTHROPLASTY Right 01/21/2019   Procedure: TOTAL KNEE ARTHROPLASTY with navigation;  Surgeon: Mardee Lynwood SQUIBB, MD;  Location: [facility];  Service: Orthopedics;  Laterality: Right;   TUBAL LIGATION      Current Outpatient Medications  Medication Sig Dispense Refill   acetaminophen  (TYLENOL ) 500 MG tablet Take 500 mg by mouth every 6 (six) hours as needed.     albuterol  (PROVENTIL  HFA;VENTOLIN  HFA) 108 (90 Base) MCG/ACT inhaler Inhale 1-2 puffs into the lungs every 4 (four) hours as needed for wheezing or shortness of breath.      azelastine  (ASTELIN ) 0.1 % nasal spray USE 1 TO 2 SPRAYS IN EACH NOSTRIL EVERY 12 HOURS AS NEEDED FOR ALLERGIES     budesonide-formoterol  (SYMBICORT) 80-4.5 MCG/ACT inhaler Inhale 2 puffs into the lungs 2 (two) times daily as needed.     cyanocobalamin  (,VITAMIN B-12,) 1000 MCG/ML injection Inject 1ml once a week for 4 weeks then once a month 8 mL 0   cyclobenzaprine  (FLEXERIL ) 5 MG tablet Take 1 tablet (5 mg total) by mouth 3 (three) times daily as needed. 15 tablet 0   furosemide  (LASIX ) 20 MG tablet TAKE 1 TABLET BY MOUTH EVERY DAY AS NEEDED FOR WEIGHT GAIN OVER 3 POUNDS IN 24 HRS 30 tablet 3   gabapentin  (NEURONTIN ) 100 MG capsule Take 100 mg by mouth daily.      ibuprofen (ADVIL) 600 MG tablet Take 800 mg by mouth daily as needed.     losartan  (COZAAR ) 25 MG tablet Take 0.5 tablets (12.5 mg total) by mouth daily. 45 tablet 3   Menthol , Topical Analgesic, (BIOFREEZE) 4 % GEL Apply 1 Application topically as needed. Left knee     montelukast  (SINGULAIR ) 10 MG tablet Take 10 mg by mouth at bedtime.     potassium chloride  SA (KLOR-CON  M) 20 MEQ tablet TAKE 1 TABLET BY MOUTH AS NEEDED ONLY WHEN TAKING LASIX  30 tablet 3   Semaglutide-Weight Management 2.4 MG/0.75ML SOAJ Inject 2.4 mg into the skin once a week.     baclofen  (LIORESAL ) 10 MG tablet Take 10 mg by mouth every 8 (eight) hours as needed.     Calcium  Carbonate-Vit D-Min (CALCIUM  600+D PLUS MINERALS) 600-400 MG-UNIT TABS Take by mouth. (Patient not taking: Reported on 09/29/2024)     celecoxib  (CELEBREX ) 200 MG capsule Take 1 capsule (200 mg total) by mouth 2 (two) times daily. (Patient not taking: Reported on 09/29/2024) 45 capsule 1   enoxaparin  (LOVENOX ) 40 MG/0.4ML injection Inject 0.4 mLs (40 mg total) into the skin daily for 14 days. (Patient not taking: Reported on  09/29/2024) 5.6 mL 0   ferrous sulfate  325 (65 FE) MG EC tablet Take by mouth. (Patient not taking: Reported on 09/29/2024)     fluticasone  (FLONASE ) 50 MCG/ACT nasal spray Place 1 spray into both nostrils daily. (Patient not taking: Reported on 09/29/2024)     potassium chloride  (KLOR-CON ) 10 MEQ tablet Take 10 mEq by mouth daily.     No current facility-administered medications for this visit.     Allergies:   Codeine and Hydrocodone    Social History:  The patient  reports that she has never smoked. She has never used smokeless tobacco. She reports that she does not currently use alcohol. She reports that she does not use drugs.   Family History:   family history includes Diabetes in her mother; Heart failure in her mother; Hypertension in her mother.    Review of Systems: Review of Systems  Constitutional: Negative.    HENT: Negative.    Respiratory: Negative.    Cardiovascular: Negative.   Gastrointestinal: Negative.   Musculoskeletal: Negative.   Neurological: Negative.   Psychiatric/Behavioral: Negative.    All other systems reviewed and are negative.   PHYSICAL EXAM: VS:  BP 118/74 (BP Location: Left Arm, Patient Position: Sitting, Cuff Size: Normal)   Pulse (!) 57   Ht 5' 5 (1.651 m)   Wt 176 lb 3.2 oz (79.9 kg)   SpO2 97%   BMI 29.32 kg/m  , BMI Body mass index is 29.32 kg/m. GEN: Well nourished, well developed, in no acute distress HEENT: normal Neck: no JVD, carotid bruits, or masses Cardiac: RRR; no murmurs, rubs, or gallops,no edema  Respiratory:  clear to auscultation bilaterally, normal work of breathing GI: soft, nontender, nondistended, + BS MS: no deformity or atrophy Skin: warm and dry, no rash Neuro:  Strength and sensation are intact Psych: euthymic mood, full affect  Recent Labs: 10/22/2023: ALT 14 11/19/2023: B Natriuretic Peptide 142.9; BUN 13; Creatinine, Ser 0.79; Potassium 3.7; Sodium 139 01/08/2024: Hemoglobin 10.7; Platelets 242    Lipid Panel No results found for: CHOL, HDL, LDLCALC, TRIG    Wt Readings from Last 3 Encounters:  09/29/24 176 lb 3.2 oz (79.9 kg)  01/08/24 172 lb 2 oz (78.1 kg)  11/19/23 173 lb (78.5 kg)     ASSESSMENT AND PLAN:  Problem List Items Addressed This Visit       Cardiology Problems   Pulmonary emboli (HCC)   Relevant Orders   EKG 12-Lead (Completed)   Essential (primary) hypertension   Hypertensive disorder   Other Visit Diagnoses       Shortness of breath    -  Primary   Relevant Orders   EKG 12-Lead (Completed)     Deep vein thrombosis (DVT) of lower extremity, unspecified chronicity, unspecified laterality, unspecified vein (HCC)       Relevant Orders   EKG 12-Lead (Completed)     Leg edema          Diastolic CHF Appears euvolemic off Lasix , takes as needed, none recently Continues on potassium  daily Normal ejection fraction on echo December 18, 2023  History of DVT/PE Prior IVC filter, has since been removed  Heart murmur Minimal on exam, echo January 2025 with no significant structural heart disease  Essential hypertension Blood pressure is well controlled on today's visit. No changes made to the medications. Continue losartan  25 daily  Seen in consultation for Dr. Lauraine Blanch and will be referred back to her office for ongoing care of the issues  detailed above  Signed, Velinda Lunger, M.D., Ph.D. [facility] [place], [place] [phone]

## 2024-09-29 ENCOUNTER — Ambulatory Visit: Attending: Cardiovascular Disease | Admitting: Cardiovascular Disease

## 2024-09-29 ENCOUNTER — Encounter: Payer: Self-pay | Admitting: Cardiovascular Disease

## 2024-09-29 VITALS — BP 118/74 | HR 57 | Ht 65.0 in | Wt 176.2 lb

## 2024-09-29 DIAGNOSIS — I10 Essential (primary) hypertension: Secondary | ICD-10-CM | POA: Diagnosis not present

## 2024-09-29 DIAGNOSIS — I82409 Acute embolism and thrombosis of unspecified deep veins of unspecified lower extremity: Secondary | ICD-10-CM

## 2024-09-29 DIAGNOSIS — I2699 Other pulmonary embolism without acute cor pulmonale: Secondary | ICD-10-CM | POA: Diagnosis not present

## 2024-09-29 DIAGNOSIS — R0602 Shortness of breath: Secondary | ICD-10-CM

## 2024-09-29 DIAGNOSIS — R600 Localized edema: Secondary | ICD-10-CM

## 2024-09-29 MED ORDER — LOSARTAN POTASSIUM 25 MG PO TABS: 12.5000 mg | ORAL_TABLET | Freq: Every day | ORAL | 3 refills | Status: AC

## 2024-09-29 MED ORDER — POTASSIUM CHLORIDE CRYS ER 10 MEQ PO TBCR: 20.0000 meq | EXTENDED_RELEASE_TABLET | Freq: Every day | ORAL | 3 refills | Status: AC

## 2024-09-29 NOTE — Patient Instructions (Signed)

## 2024-10-06 ENCOUNTER — Ambulatory Visit: Attending: Physical Medicine & Rehabilitation | Admitting: Physical Therapy

## 2024-10-06 DIAGNOSIS — M5459 Other low back pain: Secondary | ICD-10-CM | POA: Diagnosis present

## 2024-10-06 DIAGNOSIS — R262 Difficulty in walking, not elsewhere classified: Secondary | ICD-10-CM | POA: Insufficient documentation

## 2024-10-06 DIAGNOSIS — M5416 Radiculopathy, lumbar region: Secondary | ICD-10-CM | POA: Diagnosis present

## 2024-10-12 ENCOUNTER — Ambulatory Visit: Admitting: Physical Therapy

## 2024-10-14 ENCOUNTER — Ambulatory Visit: Admitting: Physical Therapy

## 2024-10-19 NOTE — Therapy (Deleted)
 OUTPATIENT PHYSICAL THERAPY TREATMENT   Patient Name: Erica Powers MRN: [id number] DOB:1960/12/10, 63 y.o., female Today's Date: 10/19/2024  END OF SESSION:    Past Medical History:  Diagnosis Date   Acute right-sided low back pain    Anemia    Arthritis    Asthma    Complication of anesthesia    Woke during IVC filter removal   Diabetes mellitus without complication (HCC)    Difficult intubation    pt denies   DVT, lower extremity (HCC) 01/15/2015   right leg that went to lung   GERD (gastroesophageal reflux disease)    History of hiatal hernia    Hypertension    Osteoarthritis of lumbar spine, unspecified spinal osteoarthritis complication status    Pulmonary embolism (HCC) 01/15/2015   Rheumatoid factor positive    Sleep apnea    Past Surgical History:  Procedure Laterality Date   BREAST BIOPSY Bilateral    [facility] bx done neg [facility]    BREAST SURGERY  2000   Reduction   CESAREAN SECTION     CHOLECYSTECTOMY     COLONOSCOPY N/A 04/08/2015   Procedure: COLONOSCOPY;  Surgeon: Rogelia Copping, MD;  Location: [facility];  Service: Gastroenterology;  Laterality: N/A;   COLONOSCOPY WITH PROPOFOL  N/A 09/29/2020   Procedure: COLONOSCOPY WITH PROPOFOL ;  Surgeon: Unk Corinn Skiff, MD;  Location: [facility];  Service: Endoscopy;  Laterality: N/A;   ESOPHAGOGASTRODUODENOSCOPY (EGD) WITH PROPOFOL  N/A 07/07/2015   Procedure: ESOPHAGOGASTRODUODENOSCOPY (EGD) WITH PROPOFOL ;  Surgeon: Rogelia Copping, MD;  Location: [facility];  Service: Endoscopy;  Laterality: N/A;   ESOPHAGOGASTRODUODENOSCOPY (EGD) WITH PROPOFOL  N/A 09/29/2020   Procedure: ESOPHAGOGASTRODUODENOSCOPY (EGD) WITH PROPOFOL ;  Surgeon: Unk Corinn Skiff, MD;  Location: [facility];  Service: Endoscopy;  Laterality: N/A;   GASTRIC ROUX-EN-Y N/A 11/01/2015   Procedure: LAPAROSCOPIC ROUX-EN-Y GASTRIC, HIATAL HERNIA REPAIR, EXTENSIVE LYSIS OF ADHESIONS;  Surgeon: Ozell DELENA Mano, MD;   Location: [facility];  Service: General;  Laterality: N/A;   HEEL SPUR SURGERY Bilateral    IVC FILTER INSERTION N/A 01/19/2019   Procedure: IVC FILTER INSERTION;  Surgeon: Marea Selinda RAMAN, MD;  Location: [facility];  Service: Cardiovascular;  Laterality: N/A;   IVC FILTER INSERTION N/A 04/15/2023   Procedure: IVC FILTER INSERTION;  Surgeon: Marea Selinda RAMAN, MD;  Location: [facility];  Service: Cardiovascular;  Laterality: N/A;   IVC FILTER REMOVAL  04/16/2019   IVC FILTER REMOVAL N/A 04/16/2019   Procedure: IVC FILTER REMOVAL;  Surgeon: Marea Selinda RAMAN, MD;  Location: [facility];  Service: Cardiovascular;  Laterality: N/A;   IVC FILTER REMOVAL N/A 07/29/2023   Procedure: IVC FILTER REMOVAL;  Surgeon: Marea Selinda RAMAN, MD;  Location: [facility];  Service: Cardiovascular;  Laterality: N/A;   KNEE ARTHROPLASTY Left 05/15/2023   Procedure: COMPUTER ASSISTED TOTAL KNEE ARTHROPLASTY;  Surgeon: Mardee Lynwood SQUIBB, MD;  Location: [facility];  Service: Orthopedics;  Laterality: Left;   REDUCTION MAMMAPLASTY Bilateral 2000   ROTATOR CUFF REPAIR Right 2009   TONSILLECTOMY     TOTAL KNEE ARTHROPLASTY Right 01/21/2019   Procedure: TOTAL KNEE ARTHROPLASTY with navigation;  Surgeon: Mardee Lynwood SQUIBB, MD;  Location: [facility];  Service: Orthopedics;  Laterality: Right;   TUBAL LIGATION     Patient Active Problem List   Diagnosis Date Noted   Esophageal dysphagia 01/04/2022   Iron deficiency 01/04/2022   Vomiting 01/04/2022   Constipation 03/24/2021   Bloating 03/22/2021  Nausea 03/22/2021   Iron deficiency anemia    BMI 33.0-33.9,adult 09/08/2020   Panniculitis 03/23/2020   Generalized abdominal pain 10/08/2019   History of Roux-en-Y gastric bypass 09/16/2019   Chronic shoulder bursitis, right 05/06/2019   Chronic right shoulder pain 04/20/2019   S/P IVC filter 03/03/2019   Status post total left knee replacement 01/21/2019   DDD (degenerative disc disease), lumbar 03/31/2018    Rheumatoid factor positive 03/31/2018   Swelling of limb 07/12/2017   Varicose veins of leg with pain, bilateral 07/12/2017   Pulmonary emboli (HCC) 11/15/2015   Positive Helicobacter pylori serology 11/03/2015   Bariatric surgery status 11/01/2015   Primary osteoarthritis of left knee 09/27/2015   Primary osteoarthritis of right knee 09/27/2015   Diabetes mellitus, type 2 (HCC) 07/13/2015   Arthritis, degenerative 07/13/2015   Hypertensive disorder 06/15/2015   Asthma 12/07/2013   Carpal tunnel syndrome 12/07/2013   Essential (primary) hypertension 09/14/2009    PCP: Tobie Domino, MD  REFERRING PROVIDER: Janith Drivers, DO  REFERRING DIAG: left lumbar radiculitis  Rationale for Evaluation and Treatment: Rehabilitation  THERAPY DIAG:  No diagnosis found.  ONSET DATE: following L TKA on 05/15/2023  SUBJECTIVE:                                                                                                                                                                                           PERTINENT HISTORY:  Patient is a 63 y.o. female who presents to outpatient physical therapy with a referral for medical diagnosis left lumbar radiculitis. This patient's chief complaints consist of left low back pain radiating with pain and numbness into left lower leg and difficulty bending to the right leading to the following functional deficits: difficulty with usual activities including working, standing up from sitting, standing, prolonged sitting, hiking, biking, sleeping, lifting, self care, housework, cooking, leisure activities. Relevant past medical history and comorbidities include the following: she has Diabetes mellitus, type 2 (HCC); Arthritis, degenerative; Bariatric surgery status; Pulmonary emboli (HCC); Swelling of limb; Varicose veins of leg with pain, bilateral; Positive Helicobacter pylori serology; Asthma; Carpal tunnel syndrome; DDD (degenerative disc disease), lumbar;  Essential (primary) hypertension; Hypertensive disorder; Primary osteoarthritis of left knee; Primary osteoarthritis of right knee; Rheumatoid factor positive; Status post total left knee replacement; S/P IVC filter; BMI 33.0-33.9,adult; Chronic right shoulder pain; Chronic shoulder bursitis, right; History of Roux-en-Y gastric bypass; Panniculitis; Iron deficiency anemia; Esophageal dysphagia; Generalized abdominal pain; Iron deficiency; Bloating; Constipation; Nausea; and Vomiting on their problem list. she  has a past medical history of Acute right-sided low back pain, Anemia, Arthritis, Asthma, Complication of anesthesia, Diabetes mellitus without complication (  HCC), DVT, lower extremity (HCC) (01/15/2015), GERD (gastroesophageal reflux disease), History of hiatal hernia, Hypertension, Osteoarthritis of lumbar spine, unspecified spinal osteoarthritis complication status, Pulmonary embolism (HCC) (01/15/2015), Rheumatoid factor positive, and Sleep apnea. she  has a past surgical history that includes Heel spur surgery (Bilateral); Breast surgery (2000); Rotator cuff repair (Right, 2009); Tonsillectomy; Tubal ligation; Gastric Roux-En-Y (N/A, 11/01/2015); Reduction mammaplasty (Bilateral, 2000); Cholecystectomy; IVC FILTER INSERTION (N/A, 01/19/2019); Total knee arthroplasty (Right, 01/21/2019); IVC FILTER REMOVAL (04/16/2019); IVC FILTER REMOVAL (N/A, 04/16/2019); Breast biopsy (Bilateral); IVC FILTER INSERTION (N/A, 04/15/2023); Cesarean section; Knee Arthroplasty (Left, 05/15/2023); and IVC FILTER REMOVAL (N/A, 07/29/2023). Patient denies hx of cancer, stroke, seizures, lung problems, heart problems, unexplained weight loss, unexplained changes in bowel or bladder problems, unexplained stumbling or dropping things, and spinal surgery  Exercise history: Biking 20 miles a week with a group prior to L TKA, hiking  SUBJECTIVE STATEMENT: Patient is here for a physical therapy evaluation of left lumbar radiculitis  using Mechanical Diagnosis and Therapy (MDT/McKenzie) approach at the request of Dr.Kevin Janith, DO of the [facility] See below for further history.   PAIN: NPRS: ***  From initial PT evaluation on 10/06/2024:  NPRS: Current: 5/10,  Best: 3/10, Worst: 8/10. Pain location: pain left lower back, left glute, down back of leg, intermittently to medial lower leg. Numbness in L lower leg (stops mid lower leg). Pain description: pinching in the left lumbar spine radiating to the L leg, Numbness in L lower leg (stops mid lower leg). Aggravating factors: see below Relieving factors: see below   PRECAUTIONS: None  WEIGHT BEARING RESTRICTIONS: No  FALLS:  Has patient fallen in last 6 months? Yes. Number of falls 1 when she had her son over and she wanted to clean under her matress. She tripped over the the bed frame when stepping in to vacuum.   PATIENT GOALS: To really work on walking and get me back up, to loosen up my back and knee, so I can get back mobile and moving like I was  OBJECTIVE  Neurological  Motor Deficit:  10x single leg heel raise with B UE handheld support L with increased pain in back of knee and much lower heel raise, more difficulty with balance, but able to perform.  Reflexes:  Achilles: B 0+ Quads: B 1+ (more consistent on left) Neurodynamic Tests:  Neural Tension Testing Slump Flexion based  R  = negative L  = positive, sensitive to foot position  Movement Loss Movement Loss Symptoms  Flexion nil Catch in L low back when returning, fingers to mid shins (normal for pt).   Extension maj Feels good but stretch in the left  Side Gliding R maj Catch in L low back  Side Gliding L mod Pain in R low back     Repeated Motions Testing 5-6/10 to left knee Test Movement Symptom During Symptom After Mechanical Response Key Functional Test  REIS 1x10 peripheralizing no worse    REIL 1x10 (partial ROM) increases L glute each rep no worse    R side glide 1x10  peripheralizing worse  Walking worse  L side glide 1x10 Catch with return better than after R side glide  Walking slightly better, still limping  REIS with back against plinth 1x10 peripheralizing each rep better  Improved slightly                TREATMENT  Therapeutic exercise: therapeutic exercises that incorporate ONE parameter at one or more areas of the body to centralize symptoms, develop strength and endurance, range of motion, and flexibility required for successful completion of functional activities. REIS 1x10 REIS with back against plinth 1x10 Education on diagnosis, prognosis, POC, anatomy and physiology of current condition. Education on peripheralization, centralization, POC, avoiding flexion, avoiding nerve tension positions. Education on HEP including handout   Trial of sitting with lumbar towel roll and education on how to perform this at home.   Pt required multimodal cuing for proper technique and to facilitate improved neuromuscular control, strength, range of motion, and functional ability resulting in improved performance and form.  PATIENT EDUCATION:  Education details: educated on peripheralization, centralization, POC, avoiding flexion, avoiding nerve tension positions, HEP Person educated: Patient Education method: Programmer, Multimedia, Demonstration, and Handouts Education comprehension: verbalized understanding, returned demonstration, and needs further education  HOME EXERCISE PROGRAM: Access Code: [email or web address] URL: [email or web address] Date: 10/07/2024 Prepared by: Camie Cleverly  Exercises - Seated Correct Posture  - Standing Lumbar Extension with Counter  - 1 sets - 15 reps - 1 second hold - every 2 hours frequency  ASSESSMENT:  CLINICAL IMPRESSION: ***  MDT Assessment Provisional Classification: Derangement (vs  other) Directional Preference: Extension  From initial PT evaluation on 10/06/2024:  Patient is a 63 y.o. female referred to outpatient physical therapy with a medical diagnosis of left lumbar radiculitis and request for PT evaluation using Mechanical Diagnosis and Therapy (MDT/McKenzie) who presents with signs and symptoms consistent with chronic left sided low back pain with L radiculopathy. MDT provisional classification of lumbar derangement syndrome with extension preference vs flexion preference vs OTHER. Patient presents with significant pain, ROM, joint stiffness, neural tension, paresthesia, muscle performance (strength/power/endurance), gait, posture, balance, and activity tolerance impairments that are limiting ability to complete usual activities including working, standing up from sitting, standing, prolonged sitting, hiking, biking, sleeping, lifting, self care, housework, cooking, leisure activities without difficulty. Patient will benefit from skilled physical therapy intervention to address current body structure impairments and activity limitations to improve function and work towards goals set in current POC in order to return to prior level of function or maximal functional improvement.   OBJECTIVE IMPAIRMENTS: Abnormal gait, decreased activity tolerance, decreased balance, decreased endurance, decreased knowledge of condition, decreased mobility, difficulty walking, decreased ROM, decreased strength, hypomobility, increased muscle spasms, impaired flexibility, improper body mechanics, postural dysfunction, and pain.   ACTIVITY LIMITATIONS: carrying, lifting, bending, sitting, standing, squatting, sleeping, stairs, transfers, bed mobility, and locomotion level  PARTICIPATION LIMITATIONS: meal prep, cleaning, laundry, interpersonal relationship, driving, shopping, community activity, occupation, and cycling, hiking  PERSONAL FACTORS: Age, Past/current experiences, Time since onset of  injury/illness/exacerbation, and 3+ comorbidities:  Diabetes mellitus, type 2 (HCC); Arthritis, degenerative; Bariatric surgery status; Pulmonary emboli (HCC); Swelling of limb; Varicose veins of leg with pain, bilateral; Positive Helicobacter pylori serology; Asthma; Carpal tunnel syndrome; DDD (degenerative disc disease), lumbar; Essential (primary) hypertension; Hypertensive disorder; Primary osteoarthritis of left knee; Primary osteoarthritis of right knee; Rheumatoid factor positive; Status post total left knee replacement; S/P IVC filter; BMI 33.0-33.9,adult; Chronic right shoulder pain; Chronic shoulder bursitis, right; History of Roux-en-Y gastric bypass; Panniculitis; Iron deficiency anemia; Esophageal dysphagia; Generalized abdominal pain; Iron deficiency; Bloating; Constipation; Nausea; and Vomiting on their problem list. she  has a past medical history of Acute right-sided low back pain, Anemia, Arthritis, Asthma, Complication of anesthesia, Diabetes mellitus without complication (HCC), DVT, lower extremity (HCC) (01/15/2015), GERD (gastroesophageal reflux disease), History of hiatal hernia,  Hypertension, Osteoarthritis of lumbar spine, unspecified spinal osteoarthritis complication status, Pulmonary embolism (HCC) (01/15/2015), Rheumatoid factor positive, and Sleep apnea. she  has a past surgical history that includes Heel spur surgery (Bilateral); Breast surgery (2000); Rotator cuff repair (Right, 2009); Tonsillectomy; Tubal ligation; Gastric Roux-En-Y (N/A, 11/01/2015); Reduction mammaplasty (Bilateral, 2000); Cholecystectomy; IVC FILTER INSERTION (N/A, 01/19/2019); Total knee arthroplasty (Right, 01/21/2019); IVC FILTER REMOVAL (04/16/2019); IVC FILTER REMOVAL (N/A, 04/16/2019); Breast biopsy (Bilateral); IVC FILTER INSERTION (N/A, 04/15/2023); Cesarean section; Knee Arthroplasty (Left, 05/15/2023); and IVC FILTER REMOVAL (N/A, 07/29/2023) are also affecting patient's functional outcome.   REHAB  POTENTIAL: Good  CLINICAL DECISION MAKING: Evolving/moderate complexity  EVALUATION COMPLEXITY: Moderate   GOALS: Goals reviewed with patient? No  SHORT TERM GOALS: Target date: 10/20/2024  Patient will be independent with initial home exercise program for self-management of symptoms. Baseline: Initial HEP provided at IE (10/06/24); Goal status: In progress  LONG TERM GOALS: Target date: 12/29/2024  Patient will be independent with a long-term home exercise program for self-management of symptoms.  Baseline: Initial HEP provided at IE (10/06/24); Goal status: In progress  2.  Patient will demonstrate improved Modified Oswestry Disability Index (mODI) to equal or less than 10% to demonstrate improvement in overall condition and self-reported functional ability.  Baseline: 34% (10/06/24); Goal status: In progress  3.  Patient will demonstrate the ability to ambulate 1500 feet without antalgic gait or increased pain during 6 Minute Walk Test to demonstrate improved community ambulation towards age matched norma of 1765 feet for healthy adult female and to help her perform work and leisure activities with less difficulty.   Baseline: currently antalgic gait walking short distances (< 100 feet) in clinic with complaint of pain and difficulty walking down the hallway during work activities (10/06/24); Goal status: In progress  4.  Patient will demonstrate full available lumbar AROM without increased symptoms except intermittent end range discomfort to improve her ability to complete ADLs, work, and leisure activities.  Baseline: painful and limited - see objective (10/06/24); Goal status: In progress  5.  Patient will report NPRS equal or less than 3/10 during functional activities during the last 2 weeks to improve their abilitly to complete community, work and/or recreational activities with less limitation. Baseline: 8/10 (10/06/24); Goal status: In progress   PLAN:  PT  FREQUENCY: 2x/week  PT DURATION: 8-12 weeks  PLANNED INTERVENTIONS: 97164- PT Re-evaluation, 97750- Physical Performance Testing, 97110-Therapeutic exercises, 97530- Therapeutic activity, [id number]- Neuromuscular re-education, 97535- Self Care, [id number]- Manual therapy, [phone]- Gait training, [phone]- Aquatic Therapy, [phone]- Electrical stimulation (unattended), [phone] (1-2 muscles), [id number] (3+ muscles)- Dry Needling, Patient/Family education, Balance training, Stair training, Cryotherapy, and Moist heat.  PLAN FOR NEXT SESSION:   MDT Plan  PRINCIPLES OF MANAGEMENT Education: educated on peripheralization, centralization, POC, avoiding flexion, avoiding nerve tension positions, HEP Exercise Type: repeated lumbar extension in standing with pelvis blocked on counter  Frequency: 1x15 reps or until improvement stops every 2 hours while awake  Other Exercises / Interventions: use lumbar roll at all times when sitting.  Management Goals: reduce derangement, maintain reduction, recover function, and learn prophylaxis.   Camie SAUNDERS. Juli, PT, DPT, Cert. MDT 10/19/24, 3:00 PM  [facility] [address] [place], [place] [address] P: [phone] I F: [phone]

## 2024-10-20 ENCOUNTER — Ambulatory Visit: Admitting: Physical Therapy

## 2024-10-20 ENCOUNTER — Telehealth: Payer: Self-pay | Admitting: Physical Therapy

## 2024-10-20 NOTE — Telephone Encounter (Signed)
 Left VM notifying patient of missed PT visit scheduled at 4:45pm today. Patient arrived at clinic during the call but it was too late to see her today. She had trouble with traffic on the highway. Confirmed her appts are late enough for her and confirmed her next appt at 4:45p on Tuesday 10/27/2024.   Camie SAUNDERS. Juli, PT, DPT 10/20/24, 5:06 PM  [facility] [address] [place], [place] [address] P: [phone] I F: [phone]

## 2024-10-26 NOTE — Therapy (Addendum)
 OUTPATIENT PHYSICAL THERAPY TREATMENT   Patient Name: Erica Powers MRN: [id number] DOB:06-25-1961, 63 y.o., female Today's Date: 10/27/2024  END OF SESSION:  PT End of Session - 10/27/24 1736     Visit Number 2    Number of Visits 17    Date for Recertification  12/29/24    Authorization Type [employer]UE [employer] reporting period from 10/06/2024    Authorization Time Period VL 60 PT/OT-43 remain    Authorization - Visit Number 2    Authorization - Number of Visits 43    Progress Note Due on Visit 10    PT Start Time 1647    PT Stop Time 1730    PT Time Calculation (min) 43 min    Activity Tolerance Patient tolerated treatment well    Behavior During Therapy WFL for tasks assessed/performed           Past Medical History:  Diagnosis Date   Acute right-sided low back pain    Anemia    Arthritis    Asthma    Complication of anesthesia    Woke during IVC filter removal   Diabetes mellitus without complication (HCC)    Difficult intubation    pt denies   DVT, lower extremity (HCC) 01/15/2015   right leg that went to lung   GERD (gastroesophageal reflux disease)    History of hiatal hernia    Hypertension    Osteoarthritis of lumbar spine, unspecified spinal osteoarthritis complication status    Pulmonary embolism (HCC) 01/15/2015   Rheumatoid factor positive    Sleep apnea    Past Surgical History:  Procedure Laterality Date   BREAST BIOPSY Bilateral    [facility] bx done neg [facility]    BREAST SURGERY  2000   Reduction   CESAREAN SECTION     CHOLECYSTECTOMY     COLONOSCOPY N/A 04/08/2015   Procedure: COLONOSCOPY;  Surgeon: Rogelia Copping, MD;  Location: [facility];  Service: Gastroenterology;  Laterality: N/A;   COLONOSCOPY WITH PROPOFOL  N/A 09/29/2020   Procedure: COLONOSCOPY WITH PROPOFOL ;  Surgeon: Unk Corinn Skiff, MD;  Location: [facility];  Service: Endoscopy;  Laterality: N/A;   ESOPHAGOGASTRODUODENOSCOPY (EGD) WITH PROPOFOL  N/A  07/07/2015   Procedure: ESOPHAGOGASTRODUODENOSCOPY (EGD) WITH PROPOFOL ;  Surgeon: Rogelia Copping, MD;  Location: [facility];  Service: Endoscopy;  Laterality: N/A;   ESOPHAGOGASTRODUODENOSCOPY (EGD) WITH PROPOFOL  N/A 09/29/2020   Procedure: ESOPHAGOGASTRODUODENOSCOPY (EGD) WITH PROPOFOL ;  Surgeon: Unk Corinn Skiff, MD;  Location: [facility];  Service: Endoscopy;  Laterality: N/A;   GASTRIC ROUX-EN-Y N/A 11/01/2015   Procedure: LAPAROSCOPIC ROUX-EN-Y GASTRIC, HIATAL HERNIA REPAIR, EXTENSIVE LYSIS OF ADHESIONS;  Surgeon: Ozell DELENA Mano, MD;  Location: [facility] ORS;  Service: General;  Laterality: N/A;   HEEL SPUR SURGERY Bilateral    IVC FILTER INSERTION N/A 01/19/2019   Procedure: IVC FILTER INSERTION;  Surgeon: Marea Selinda RAMAN, MD;  Location: [facility] INVAS[facility] CV LAB;  Service: Cardiovascular;  Laterality: N/A;   IVC FILTER INSERTION N/A 04/15/2023   Procedure: IVC FILTER INSERTION;  Surgeon: Marea Selinda RAMAN, MD;  Location: [facility] IN[facility] CV [facility];  Service: Cardiovascular;  Laterality: N/A;   IVC FILTER REMOVAL  04/16/2019   IVC FILTER REMOVAL N/A 04/16/2019   Procedure: IVC FILTER REMOVAL;  Surgeon: Marea Selinda RAMAN, MD;  Location: [facility];  Service: Cardiovascular;  Laterality: N/A;   IVC FILTER REMOVAL N/A 07/29/2023   Procedure: IVC FILTER REMOVAL;  Surgeon: Marea Selinda RAMAN, MD;  Location: [facility]  INVASIVE CV LAB;  Service: Cardiovascular;  Laterality: N/A;   KNEE ARTHROPLASTY Left 05/15/2023   Procedure: COMPUTER ASSISTED TOTAL KNEE ARTHROPLASTY;  Surgeon: Mardee Lynwood SQUIBB, MD;  Location: [facility] ORS;  Service: Orthopedics;  Laterality: Left;   REDUCTION MAMMAPLASTY Bilateral 2000   ROTATOR CUFF REPAIR Right 2009   TONSILLECTOMY     TOTAL KNEE ARTHROPLASTY Right 01/21/2019   Procedure: TOTAL KNEE ARTHROPLASTY with navigation;  Surgeon: Mardee Lynwood SQUIBB, MD;  Location: [facility] OR[facility];  Service: Orthopedics;  Laterality: Right;   TUBAL LIGATION     Patient Active Problem List   Diagnosis Date  Noted   Esophageal dysphagia 01/04/2022   Iron deficiency 01/04/2022   Vomiting 01/04/2022   Constipation 03/24/2021   Bloating 03/22/2021   Nausea 03/22/2021   Iron deficiency anemia    BMI 33.0-33.9,adult 09/08/2020   Panniculitis 03/23/2020   Generalized abdominal pain 10/08/2019   History of Roux-en-Y gastric bypass 09/16/2019   Chronic shoulder bursitis, right 05/06/2019   Chronic right shoulder pain 04/20/2019   S/P IVC filter 03/03/2019   Status post total left knee replacement 01/21/2019   DDD (degenerative disc disease), lumbar 03/31/2018   Rheumatoid factor positive 03/31/2018   Swelling of limb 07/12/2017   Varicose veins of leg with pain, bilateral 07/12/2017   Pulmonary emboli (HCC) 11/15/2015   Positive Helicobacter pylori serology 11/03/2015   Bariatric surgery status 11/01/2015   Primary osteoarthritis of left knee 09/27/2015   Primary osteoarthritis of right knee 09/27/2015   Diabetes mellitus, type 2 (HCC) 07/13/2015   Arthritis, degenerative 07/13/2015   Hypertensive disorder 06/15/2015   Asthma 12/07/2013   Carpal tunnel syndrome 12/07/2013   Essential (primary) hypertension 09/14/2009    PCP: Tobie Domino, MD  REFERRING PROVIDER: Janith Drivers, DO  REFERRING DIAG: left lumbar radiculitis  Rationale for Evaluation and Treatment: Rehabilitation  THERAPY DIAG:  Other low back pain  Radiculopathy, lumbar region  Difficulty in walking, not elsewhere classified  ONSET DATE: following L TKA on 05/15/2023  SUBJECTIVE:                                                                                                                                                                                           PERTINENT HISTORY:  Patient is a 63 y.o. female who presents to outpatient physical therapy with a referral for medical diagnosis left lumbar radiculitis. This patient's chief complaints consist of left low back pain radiating with pain and numbness into  left lower leg and difficulty bending to the right leading to the following functional deficits: difficulty with usual activities including working, standing up from sitting, standing, prolonged sitting, hiking,  biking, sleeping, lifting, self care, housework, cooking, leisure activities. Relevant past medical history and comorbidities include the following: she has Diabetes mellitus, type 2 (HCC); Arthritis, degenerative; Bariatric surgery status; Pulmonary emboli (HCC); Swelling of limb; Varicose veins of leg with pain, bilateral; Positive Helicobacter pylori serology; Asthma; Carpal tunnel syndrome; DDD (degenerative disc disease), lumbar; Essential (primary) hypertension; Hypertensive disorder; Primary osteoarthritis of left knee; Primary osteoarthritis of right knee; Rheumatoid factor positive; Status post total left knee replacement; S/P IVC filter; BMI 33.0-33.9,adult; Chronic right shoulder pain; Chronic shoulder bursitis, right; History of Roux-en-Y gastric bypass; Panniculitis; Iron deficiency anemia; Esophageal dysphagia; Generalized abdominal pain; Iron deficiency; Bloating; Constipation; Nausea; and Vomiting on their problem list. she  has a past medical history of Acute right-sided low back pain, Anemia, Arthritis, Asthma, Complication of anesthesia, Diabetes mellitus without complication (HCC), DVT, lower extremity (HCC) (01/15/2015), GERD (gastroesophageal reflux disease), History of hiatal hernia, Hypertension, Osteoarthritis of lumbar spine, unspecified spinal osteoarthritis complication status, Pulmonary embolism (HCC) (01/15/2015), Rheumatoid factor positive, and Sleep apnea. she  has a past surgical history that includes Heel spur surgery (Bilateral); Breast surgery (2000); Rotator cuff repair (Right, 2009); Tonsillectomy; Tubal ligation; Gastric Roux-En-Y (N/A, 11/01/2015); Reduction mammaplasty (Bilateral, 2000); Cholecystectomy; IVC FILTER INSERTION (N/A, 01/19/2019); Total knee arthroplasty  (Right, 01/21/2019); IVC FILTER REMOVAL (04/16/2019); IVC FILTER REMOVAL (N/A, 04/16/2019); Breast biopsy (Bilateral); IVC FILTER INSERTION (N/A, 04/15/2023); Cesarean section; Knee Arthroplasty (Left, 05/15/2023); and IVC FILTER REMOVAL (N/A, 07/29/2023). Patient denies hx of cancer, stroke, seizures, lung problems, heart problems, unexplained weight loss, unexplained changes in bowel or bladder problems, unexplained stumbling or dropping things, and spinal surgery  Exercise history: Biking 20 miles a week with a group prior to L TKA, hiking  SUBJECTIVE STATEMENT: Patient states since last PT session she has just been dealing with it when referring to her low back pain and stiffness. She did her HEP. Pretty close to every 2 hours, but not always with back up against a cabinet. It helped some. She states it stretched her back. When she first started it made her really sore but she continued doing it and it started loosening up in her buttock. She is still doing it when ever she can. She states she is in the car a lot and when she gets out she stretches. She does 5-6 reps at a time. She states it is helping the pain in the back of her left thigh. She thinks the pain in the back of her leg has been gone for about 2 weeks. She still has it in the medial knee. She bought her something to put behind her back in the car. That has been helping her.   PAIN: NPRS: 5-6/10 left low back and superior glutes bilaterally.   From initial PT evaluation on 10/06/2024:  NPRS: Current: 5/10,  Best: 3/10, Worst: 8/10. Pain location: pain left lower back, left glute, down back of leg, intermittently to medial lower leg. Numbness in L lower leg (stops mid lower leg). Pain description: pinching in the left lumbar spine radiating to the L leg, Numbness in L lower leg (stops mid lower leg). Aggravating factors: see below Relieving factors: see below   PRECAUTIONS: None  WEIGHT BEARING RESTRICTIONS: No  FALLS:  Has  patient fallen in last 6 months? Yes. Number of falls 1 when she had her son over and she wanted to clean under her matress. She tripped over the the bed frame when stepping in to vacuum.   PATIENT GOALS: To really work on  walking and get me back up, to loosen up my back and knee, so I can get back mobile and moving like I was  OBJECTIVE  Vitals:   10/27/24 1654  BP: 125/67  Pulse: (!) 59  SpO2: 100%    Movement Loss Movement Loss Symptoms  Flexion nil Pull in left thigh. fingers to mid shins (normal for pt).   Extension mod Feels good but stretch in the left  Side Gliding R maj Pull in L low back  Side Gliding L maj Pinch in L low back     Repeated Motions Test Movement Symptom During Symptom After Mechanical Response Key Functional Test  REIS with back against plinth 1x15 increases each rep peripheralized to L posterior thigh Standing up straighter No change in gait  REIS with back against plinth 1x15 peripheralizing L thigh peripheralized to L posterior thigh Standing up straighter   REIL Peripheralizing L thigh No worse     ACCESSORY MOTION CPA to lumbar and lower thoracic spine hypomobile L5 most tender L3 reproduces paresthesia to L thigh  MUSCLE LENGTH Prone Iliopsoas Length Assessment (manual)  B: similarly restricted with guarded end feel  TREATMENT                                                                                                                           Therapeutic exercise: therapeutic exercises that incorporate ONE parameter at one or more areas of the body to centralize symptoms, develop strength and endurance, range of motion, and flexibility required for successful completion of functional activities.  Vitals check for review of systems (see above).   Baseline lumbar AROM (see above) and   Ambulation and lumbar AROM checks between other interventions to assess effect.   Repeated motions (see chart above) Improved upright standing  afterwards  Manual therapy: to reduce pain and tissue tension, improve range of motion, neuromodulation, in order to promote improved ability to complete functional activities. PRONE  CPA assessment (See above) Psoas length assessment (see above)  PRONE with folded pillow supporting lumbar spine and contralateral LE on floor with wedge under heel, manual pressure into PPT at ischial tuberosity with knee elevated with table inclination PROM psoas stretch Sets of 30-45 seconds with cumulative time of 2 min per side With contract-relax held 6 seconds at a time during contract  Feels great Improved right sidebending ROM with less pain following   Pt required multimodal cuing for proper technique and to facilitate improved neuromuscular control, strength, range of motion, and functional ability resulting in improved performance and form.  PATIENT EDUCATION:  Education details: educated on peripheralization, centralization, POC, avoiding flexion, avoiding nerve tension positions, HEP Person educated: Patient Education method: Programmer, Multimedia, Demonstration, and Handouts Education comprehension: verbalized understanding, returned demonstration, and needs further education  HOME EXERCISE PROGRAM: Access Code: [email or web address] URL: [email or web address] Date: 10/07/2024 Prepared by: Camie Cleverly  Exercises - Seated Correct Posture  - Standing Lumbar Extension with Counter  - 1 sets - 15  reps - 1 second hold - every 2 hours frequency  ASSESSMENT:  CLINICAL IMPRESSION: Pateint arrives with partial participation in HEP but enough to make central back sore and feel like it was helping her left posterior thigh pain. Attempted to confirm MDT classification and determined she does not have derangement syndrome and is more likely OTHER: lumbar stenosis or inflammatory arthropathy. Therefore proceeded to further examination of contributing factors such as psoas length assessment. Pt found to have  limited hip extension related to psoas tightness/guarding. After manual stretch of the psoas, she had significantly improved R sidebend with less pain. Continues to have antalgic gait favoring L LE. Patient had reproduction of L thigh paresthesia with CPA to approx L3 that lingered in prone following CPAs and was relieved with placement of a folded pillow under the lumbar spine in prone which limited lumbar extension in this position. Patient was able to stand more upright and reported feeling better by end of session. She would likely benefit from learning self hip flexor stretch and ability to stabilize lumbar spine away from irritating extension during functional activities, while improving motion in all directions with intermittent stretching. Patient would benefit from continued management of limiting condition by skilled physical therapist to address remaining impairments and functional limitations to work towards stated goals and return to PLOF or maximal functional independence.    MDT Assessment Provisional Classification: OTHER: spinal stenosis vs inflammatory arthropathy vs unclassified.   From initial PT evaluation on 10/06/2024:  Patient is a 64 y.o. female referred to outpatient physical therapy with a medical diagnosis of left lumbar radiculitis and request for PT evaluation using Mechanical Diagnosis and Therapy (MDT/McKenzie) who presents with signs and symptoms consistent with chronic left sided low back pain with L radiculopathy. MDT provisional classification of lumbar derangement syndrome with extension preference vs flexion preference vs OTHER. Patient presents with significant pain, ROM, joint stiffness, neural tension, paresthesia, muscle performance (strength/power/endurance), gait, posture, balance, and activity tolerance impairments that are limiting ability to complete usual activities including working, standing up from sitting, standing, prolonged sitting, hiking, biking,  sleeping, lifting, self care, housework, cooking, leisure activities without difficulty. Patient will benefit from skilled physical therapy intervention to address current body structure impairments and activity limitations to improve function and work towards goals set in current POC in order to return to prior level of function or maximal functional improvement.   OBJECTIVE IMPAIRMENTS: Abnormal gait, decreased activity tolerance, decreased balance, decreased endurance, decreased knowledge of condition, decreased mobility, difficulty walking, decreased ROM, decreased strength, hypomobility, increased muscle spasms, impaired flexibility, improper body mechanics, postural dysfunction, and pain.   ACTIVITY LIMITATIONS: carrying, lifting, bending, sitting, standing, squatting, sleeping, stairs, transfers, bed mobility, and locomotion level  PARTICIPATION LIMITATIONS: meal prep, cleaning, laundry, interpersonal relationship, driving, shopping, community activity, occupation, and cycling, hiking  PERSONAL FACTORS: Age, Past/current experiences, Time since onset of injury/illness/exacerbation, and 3+ comorbidities:  Diabetes mellitus, type 2 (HCC); Arthritis, degenerative; Bariatric surgery status; Pulmonary emboli (HCC); Swelling of limb; Varicose veins of leg with pain, bilateral; Positive Helicobacter pylori serology; Asthma; Carpal tunnel syndrome; DDD (degenerative disc disease), lumbar; Essential (primary) hypertension; Hypertensive disorder; Primary osteoarthritis of left knee; Primary osteoarthritis of right knee; Rheumatoid factor positive; Status post total left knee replacement; S/P IVC filter; BMI 33.0-33.9,adult; Chronic right shoulder pain; Chronic shoulder bursitis, right; History of Roux-en-Y gastric bypass; Panniculitis; Iron deficiency anemia; Esophageal dysphagia; Generalized abdominal pain; Iron deficiency; Bloating; Constipation; Nausea; and Vomiting on their problem list. she  has a past  medical history of Acute right-sided low back pain, Anemia, Arthritis, Asthma, Complication of anesthesia, Diabetes mellitus without complication (HCC), DVT, lower extremity (HCC) (01/15/2015), GERD (gastroesophageal reflux disease), History of hiatal hernia, Hypertension, Osteoarthritis of lumbar spine, unspecified spinal osteoarthritis complication status, Pulmonary embolism (HCC) (01/15/2015), Rheumatoid factor positive, and Sleep apnea. she  has a past surgical history that includes Heel spur surgery (Bilateral); Breast surgery (2000); Rotator cuff repair (Right, 2009); Tonsillectomy; Tubal ligation; Gastric Roux-En-Y (N/A, 11/01/2015); Reduction mammaplasty (Bilateral, 2000); Cholecystectomy; IVC FILTER INSERTION (N/A, 01/19/2019); Total knee arthroplasty (Right, 01/21/2019); IVC FILTER REMOVAL (04/16/2019); IVC FILTER REMOVAL (N/A, 04/16/2019); Breast biopsy (Bilateral); IVC FILTER INSERTION (N/A, 04/15/2023); Cesarean section; Knee Arthroplasty (Left, 05/15/2023); and IVC FILTER REMOVAL (N/A, 07/29/2023) are also affecting patient's functional outcome.   REHAB POTENTIAL: Good  CLINICAL DECISION MAKING: Evolving/moderate complexity  EVALUATION COMPLEXITY: Moderate   GOALS: Goals reviewed with patient? No  SHORT TERM GOALS: Target date: 10/20/2024  Patient will be independent with initial home exercise program for self-management of symptoms. Baseline: Initial HEP provided at IE (10/06/24); Goal status: In progress  LONG TERM GOALS: Target date: 12/29/2024  Patient will be independent with a long-term home exercise program for self-management of symptoms.  Baseline: Initial HEP provided at IE (10/06/24); Goal status: In progress  2.  Patient will demonstrate improved Modified Oswestry Disability Index (mODI) to equal or less than 10% to demonstrate improvement in overall condition and self-reported functional ability.  Baseline: 34% (10/06/24); Goal status: In progress  3.  Patient will  demonstrate the ability to ambulate 1500 feet without antalgic gait or increased pain during 6 Minute Walk Test to demonstrate improved community ambulation towards age matched norma of 1765 feet for healthy adult female and to help her perform work and leisure activities with less difficulty.   Baseline: currently antalgic gait walking short distances (< 100 feet) in clinic with complaint of pain and difficulty walking down the hallway during work activities (10/06/24); Goal status: In progress  4.  Patient will demonstrate full available lumbar AROM without increased symptoms except intermittent end range discomfort to improve her ability to complete ADLs, work, and leisure activities.  Baseline: painful and limited - see objective (10/06/24); Goal status: In progress  5.  Patient will report NPRS equal or less than 3/10 during functional activities during the last 2 weeks to improve their abilitly to complete community, work and/or recreational activities with less limitation. Baseline: 8/10 (10/06/24); Goal status: In progress   PLAN:  PT FREQUENCY: 2x/week  PT DURATION: 8-12 weeks  PLANNED INTERVENTIONS: 97164- PT Re-evaluation, 97750- Physical Performance Testing, 97110-Therapeutic exercises, 97530- Therapeutic activity, [id number]- Neuromuscular re-education, 97535- Self Care, [id number]- Manual therapy, [phone]- Gait training, [phone]- Aquatic Therapy, [phone]- Electrical stimulation (unattended), [phone] (1-2 muscles), [id number] (3+ muscles)- Dry Needling, Patient/Family education, Balance training, Stair training, Cryotherapy, and Moist heat.  PLAN FOR NEXT SESSION: She would likely benefit from learning self hip flexor stretch and ability to stabilize lumbar spine away from irritating extension during functional activities, while improving motion in all directions with intermittent stretching.  Camie SAUNDERS. Juli, PT, DPT, Cert. MDT 10/27/24, 6:08 PM  Addendum to correct error in clinical impression.   Camie SAUNDERS. Juli, PT, DPT, Cert. MDT 11/09/24, 2:37 PM  [facility] [address] [place], [place] [address] P: [phone] I F: [phone]

## 2024-10-27 ENCOUNTER — Encounter: Payer: Self-pay | Admitting: Physical Therapy

## 2024-10-27 ENCOUNTER — Ambulatory Visit: Admitting: Physical Therapy

## 2024-10-27 VITALS — BP 125/67 | HR 59

## 2024-10-27 DIAGNOSIS — M5416 Radiculopathy, lumbar region: Secondary | ICD-10-CM

## 2024-10-27 DIAGNOSIS — M5459 Other low back pain: Secondary | ICD-10-CM | POA: Diagnosis not present

## 2024-10-27 DIAGNOSIS — R262 Difficulty in walking, not elsewhere classified: Secondary | ICD-10-CM

## 2024-11-02 ENCOUNTER — Ambulatory Visit: Admitting: Physical Therapy

## 2024-11-03 ENCOUNTER — Encounter: Admitting: Physical Therapy

## 2024-11-05 ENCOUNTER — Ambulatory Visit: Admitting: Physical Therapy

## 2024-11-09 ENCOUNTER — Ambulatory Visit: Admitting: Physical Therapy

## 2024-11-09 ENCOUNTER — Telehealth: Payer: Self-pay | Admitting: Physical Therapy

## 2024-11-09 DIAGNOSIS — M5416 Radiculopathy, lumbar region: Secondary | ICD-10-CM | POA: Insufficient documentation

## 2024-11-09 DIAGNOSIS — R262 Difficulty in walking, not elsewhere classified: Secondary | ICD-10-CM | POA: Insufficient documentation

## 2024-11-09 DIAGNOSIS — M5459 Other low back pain: Secondary | ICD-10-CM | POA: Insufficient documentation

## 2024-11-09 NOTE — Telephone Encounter (Signed)
 Called patient to inform of missed appointment today at 5:30pm, went straight to voicemail and voicemail was full.  Vernell Rise, SPT 11/09/24  [facility] [address] [place], [place] [address] P: [phone] I F: [phone]

## 2024-11-09 NOTE — Therapy (Incomplete)
 OUTPATIENT PHYSICAL THERAPY TREATMENT   Patient Name: Erica Powers MRN: [id number] DOB:1961-10-01, 63 y.o., female Today's Date: 11/09/2024  END OF SESSION:     Past Medical History:  Diagnosis Date   Acute right-sided low back pain    Anemia    Arthritis    Asthma    Complication of anesthesia    Woke during IVC filter removal   Diabetes mellitus without complication (HCC)    Difficult intubation    pt denies   DVT, lower extremity (HCC) 01/15/2015   right leg that went to lung   GERD (gastroesophageal reflux disease)    History of hiatal hernia    Hypertension    Osteoarthritis of lumbar spine, unspecified spinal osteoarthritis complication status    Pulmonary embolism (HCC) 01/15/2015   Rheumatoid factor positive    Sleep apnea    Past Surgical History:  Procedure Laterality Date   BREAST BIOPSY Bilateral    [facility] bx done neg [facility]    BREAST SURGERY  2000   Reduction   CESAREAN SECTION     CHOLECYSTECTOMY     COLONOSCOPY N/A 04/08/2015   Procedure: COLONOSCOPY;  Surgeon: Rogelia Copping, MD;  Location: [facility];  Service: Gastroenterology;  Laterality: N/A;   COLONOSCOPY WITH PROPOFOL  N/A 09/29/2020   Procedure: COLONOSCOPY WITH PROPOFOL ;  Surgeon: Unk Corinn Skiff, MD;  Location: [facility];  Service: Endoscopy;  Laterality: N/A;   ESOPHAGOGASTRODUODENOSCOPY (EGD) WITH PROPOFOL  N/A 07/07/2015   Procedure: ESOPHAGOGASTRODUODENOSCOPY (EGD) WITH PROPOFOL ;  Surgeon: Rogelia Copping, MD;  Location: [facility];  Service: Endoscopy;  Laterality: N/A;   ESOPHAGOGASTRODUODENOSCOPY (EGD) WITH PROPOFOL  N/A 09/29/2020   Procedure: ESOPHAGOGASTRODUODENOSCOPY (EGD) WITH PROPOFOL ;  Surgeon: Unk Corinn Skiff, MD;  Location: [facility];  Service: Endoscopy;  Laterality: N/A;   GASTRIC ROUX-EN-Y N/A 11/01/2015   Procedure: LAPAROSCOPIC ROUX-EN-Y GASTRIC, HIATAL HERNIA REPAIR, EXTENSIVE LYSIS OF ADHESIONS;  Surgeon: Ozell DELENA Mano, MD;   Location: [facility];  Service: General;  Laterality: N/A;   HEEL SPUR SURGERY Bilateral    IVC FILTER INSERTION N/A 01/19/2019   Procedure: IVC FILTER INSERTION;  Surgeon: Marea Selinda RAMAN, MD;  Location: [facility];  Service: Cardiovascular;  Laterality: N/A;   IVC FILTER INSERTION N/A 04/15/2023   Procedure: IVC FILTER INSERTION;  Surgeon: Marea Selinda RAMAN, MD;  Location: [facility];  Service: Cardiovascular;  Laterality: N/A;   IVC FILTER REMOVAL  04/16/2019   IVC FILTER REMOVAL N/A 04/16/2019   Procedure: IVC FILTER REMOVAL;  Surgeon: Marea Selinda RAMAN, MD;  Location: [facility];  Service: Cardiovascular;  Laterality: N/A;   IVC FILTER REMOVAL N/A 07/29/2023   Procedure: IVC FILTER REMOVAL;  Surgeon: Marea Selinda RAMAN, MD;  Location: [facility];  Service: Cardiovascular;  Laterality: N/A;   KNEE ARTHROPLASTY Left 05/15/2023   Procedure: COMPUTER ASSISTED TOTAL KNEE ARTHROPLASTY;  Surgeon: Mardee Lynwood SQUIBB, MD;  Location: [facility];  Service: Orthopedics;  Laterality: Left;   REDUCTION MAMMAPLASTY Bilateral 2000   ROTATOR CUFF REPAIR Right 2009   TONSILLECTOMY     TOTAL KNEE ARTHROPLASTY Right 01/21/2019   Procedure: TOTAL KNEE ARTHROPLASTY with navigation;  Surgeon: Mardee Lynwood SQUIBB, MD;  Location: [facility];  Service: Orthopedics;  Laterality: Right;   TUBAL LIGATION     Patient Active Problem List   Diagnosis Date Noted   Esophageal dysphagia 01/04/2022   Iron deficiency 01/04/2022   Vomiting 01/04/2022   Constipation 03/24/2021   Bloating 03/22/2021  Nausea 03/22/2021   Iron deficiency anemia    BMI 33.0-33.9,adult 09/08/2020   Panniculitis 03/23/2020   Generalized abdominal pain 10/08/2019   History of Roux-en-Y gastric bypass 09/16/2019   Chronic shoulder bursitis, right 05/06/2019   Chronic right shoulder pain 04/20/2019   S/P IVC filter 03/03/2019   Status post total left knee replacement 01/21/2019   DDD (degenerative disc disease), lumbar 03/31/2018    Rheumatoid factor positive 03/31/2018   Swelling of limb 07/12/2017   Varicose veins of leg with pain, bilateral 07/12/2017   Pulmonary emboli (HCC) 11/15/2015   Positive Helicobacter pylori serology 11/03/2015   Bariatric surgery status 11/01/2015   Primary osteoarthritis of left knee 09/27/2015   Primary osteoarthritis of right knee 09/27/2015   Diabetes mellitus, type 2 (HCC) 07/13/2015   Arthritis, degenerative 07/13/2015   Hypertensive disorder 06/15/2015   Asthma 12/07/2013   Carpal tunnel syndrome 12/07/2013   Essential (primary) hypertension 09/14/2009   PCP: Tobie Domino, MD  REFERRING PROVIDER: Janith Drivers, DO  REFERRING DIAG: left lumbar radiculitis  Rationale for Evaluation and Treatment: Rehabilitation  THERAPY DIAG:  Other low back pain  Radiculopathy, lumbar region  Difficulty in walking, not elsewhere classified  ONSET DATE: following L TKA on 05/15/2023  SUBJECTIVE:                                                                                                                                                                                           PERTINENT HISTORY:  Patient is a 63 y.o. female who presents to outpatient physical therapy with a referral for medical diagnosis left lumbar radiculitis. This patient's chief complaints consist of left low back pain radiating with pain and numbness into left lower leg and difficulty bending to the right leading to the following functional deficits: difficulty with usual activities including working, standing up from sitting, standing, prolonged sitting, hiking, biking, sleeping, lifting, self care, housework, cooking, leisure activities. Relevant past medical history and comorbidities include the following: she has Diabetes mellitus, type 2 (HCC); Arthritis, degenerative; Bariatric surgery status; Pulmonary emboli (HCC); Swelling of limb; Varicose veins of leg with pain, bilateral; Positive Helicobacter pylori  serology; Asthma; Carpal tunnel syndrome; DDD (degenerative disc disease), lumbar; Essential (primary) hypertension; Hypertensive disorder; Primary osteoarthritis of left knee; Primary osteoarthritis of right knee; Rheumatoid factor positive; Status post total left knee replacement; S/P IVC filter; BMI 33.0-33.9,adult; Chronic right shoulder pain; Chronic shoulder bursitis, right; History of Roux-en-Y gastric bypass; Panniculitis; Iron deficiency anemia; Esophageal dysphagia; Generalized abdominal pain; Iron deficiency; Bloating; Constipation; Nausea; and Vomiting on their problem list. she  has a past medical history of Acute right-sided low back  pain, Anemia, Arthritis, Asthma, Complication of anesthesia, Diabetes mellitus without complication (HCC), DVT, lower extremity (HCC) (01/15/2015), GERD (gastroesophageal reflux disease), History of hiatal hernia, Hypertension, Osteoarthritis of lumbar spine, unspecified spinal osteoarthritis complication status, Pulmonary embolism (HCC) (01/15/2015), Rheumatoid factor positive, and Sleep apnea. she  has a past surgical history that includes Heel spur surgery (Bilateral); Breast surgery (2000); Rotator cuff repair (Right, 2009); Tonsillectomy; Tubal ligation; Gastric Roux-En-Y (N/A, 11/01/2015); Reduction mammaplasty (Bilateral, 2000); Cholecystectomy; IVC FILTER INSERTION (N/A, 01/19/2019); Total knee arthroplasty (Right, 01/21/2019); IVC FILTER REMOVAL (04/16/2019); IVC FILTER REMOVAL (N/A, 04/16/2019); Breast biopsy (Bilateral); IVC FILTER INSERTION (N/A, 04/15/2023); Cesarean section; Knee Arthroplasty (Left, 05/15/2023); and IVC FILTER REMOVAL (N/A, 07/29/2023). Patient denies hx of cancer, stroke, seizures, lung problems, heart problems, unexplained weight loss, unexplained changes in bowel or bladder problems, unexplained stumbling or dropping things, and spinal surgery  Exercise history: Biking 20 miles a week with a group prior to L TKA, hiking  SUBJECTIVE  STATEMENT: ***  PAIN: NPRS:***  From initial PT evaluation on 10/06/2024:  NPRS: Current: 5/10,  Best: 3/10, Worst: 8/10. Pain location: pain left lower back, left glute, down back of leg, intermittently to medial lower leg. Numbness in L lower leg (stops mid lower leg). Pain description: pinching in the left lumbar spine radiating to the L leg, Numbness in L lower leg (stops mid lower leg). Aggravating factors: see below Relieving factors: see below   PRECAUTIONS: None  WEIGHT BEARING RESTRICTIONS: No  FALLS:  Has patient fallen in last 6 months? Yes. Number of falls 1 when she had her son over and she wanted to clean under her matress. She tripped over the the bed frame when stepping in to vacuum.   PATIENT GOALS: To really work on walking and get me back up, to loosen up my back and knee, so I can get back mobile and moving like I was  OBJECTIVE  Movement Loss Movement Loss Symptoms  Flexion {MajModMinNil:32701} ***  Extension {MajModMinNil:32701} ***  Side Gliding R {MajModMinNil:32701} ***  Side Gliding L {MajModMinNil:32701} ***   Repeated Motions  Sx pre test: *** Test Movement Symptom During Symptom After Mechanical Response Key Functional Test  L side glide 1 x 10  {ProducesAbolishesIncreases:32702} {BetterWorseNobetterNoworse:32703} {IncreasedROMDecreasedROMNoEffect:32704} {BetterWorseNoEffect:32699}   TREATMENT                                                                                                                            Therapeutic exercise: therapeutic exercises that incorporate ONE parameter at one or more areas of the body to centralize symptoms, develop strength and endurance, range of motion, and flexibility required for successful completion of functional activities.  Standing hip flexor stretch  2 x 30 seconds each side  Supine B piriformis  2 x 30 seconds each side  Repeated motions (see above)  Neuromuscular Re-education: a technique or  exercise performed with the goal of improving the level of communication between the body and the brain, such as for  balance, motor control, muscle activation patterns, coordination, desensitization, quality of muscle contraction, proprioception, and/or kinesthetic sense needed for successful and safe completion of functional activities.   Hooklying PPT in a pain free range of motion for improved lumbo pelvic control in    2 x 10  Hooklying PPT with B arm raise for maintained motor control of pelvis with movement of UE   2 x 10  Standing resisted pallof press with RTB and with PPT to improve core strengthen while maintain lumbo pelvic control   2 x 10 each side  Pt required multimodal cuing for proper technique and to facilitate improved neuromuscular control, strength, range of motion, and functional ability resulting in improved performance and form.  PATIENT EDUCATION:  Education details: educated on peripheralization, centralization, POC, avoiding flexion, avoiding nerve tension positions, HEP Person educated: Patient Education method: Programmer, Multimedia, Demonstration, and Handouts Education comprehension: verbalized understanding, returned demonstration, and needs further education  HOME EXERCISE PROGRAM: Access Code: [email or web address] URL: [email or web address] Date: 10/07/2024 Prepared by: Camie Cleverly  Exercises - Seated Correct Posture  - Standing Lumbar Extension with Counter  - 1 sets - 15 reps - 1 second hold - every 2 hours frequency  ASSESSMENT:  CLINICAL IMPRESSION: ***  Patient would benefit from continued management of limiting condition by skilled physical therapist to address remaining impairments and functional limitations to work towards stated goals and return to PLOF or maximal functional independence.    MDT Assessment Provisional Classification: OTHER: spinal stenosis vs inflammatory arthropathy vs unclassified.   From initial PT evaluation on 10/06/2024:   Patient is a 63 y.o. female referred to outpatient physical therapy with a medical diagnosis of left lumbar radiculitis and request for PT evaluation using Mechanical Diagnosis and Therapy (MDT/McKenzie) who presents with signs and symptoms consistent with chronic left sided low back pain with L radiculopathy. MDT provisional classification of lumbar derangement syndrome with extension preference vs flexion preference vs OTHER. Patient presents with significant pain, ROM, joint stiffness, neural tension, paresthesia, muscle performance (strength/power/endurance), gait, posture, balance, and activity tolerance impairments that are limiting ability to complete usual activities including working, standing up from sitting, standing, prolonged sitting, hiking, biking, sleeping, lifting, self care, housework, cooking, leisure activities without difficulty. Patient will benefit from skilled physical therapy intervention to address current body structure impairments and activity limitations to improve function and work towards goals set in current POC in order to return to prior level of function or maximal functional improvement.   OBJECTIVE IMPAIRMENTS: Abnormal gait, decreased activity tolerance, decreased balance, decreased endurance, decreased knowledge of condition, decreased mobility, difficulty walking, decreased ROM, decreased strength, hypomobility, increased muscle spasms, impaired flexibility, improper body mechanics, postural dysfunction, and pain.   ACTIVITY LIMITATIONS: carrying, lifting, bending, sitting, standing, squatting, sleeping, stairs, transfers, bed mobility, and locomotion level  PARTICIPATION LIMITATIONS: meal prep, cleaning, laundry, interpersonal relationship, driving, shopping, community activity, occupation, and cycling, hiking  PERSONAL FACTORS: Age, Past/current experiences, Time since onset of injury/illness/exacerbation, and 3+ comorbidities:  Diabetes mellitus, type 2 (HCC);  Arthritis, degenerative; Bariatric surgery status; Pulmonary emboli (HCC); Swelling of limb; Varicose veins of leg with pain, bilateral; Positive Helicobacter pylori serology; Asthma; Carpal tunnel syndrome; DDD (degenerative disc disease), lumbar; Essential (primary) hypertension; Hypertensive disorder; Primary osteoarthritis of left knee; Primary osteoarthritis of right knee; Rheumatoid factor positive; Status post total left knee replacement; S/P IVC filter; BMI 33.0-33.9,adult; Chronic right shoulder pain; Chronic shoulder bursitis, right; History of Roux-en-Y gastric bypass; Panniculitis; Iron deficiency anemia; Esophageal dysphagia; Generalized abdominal pain; Iron deficiency;  Bloating; Constipation; Nausea; and Vomiting on their problem list. she  has a past medical history of Acute right-sided low back pain, Anemia, Arthritis, Asthma, Complication of anesthesia, Diabetes mellitus without complication (HCC), DVT, lower extremity (HCC) (01/15/2015), GERD (gastroesophageal reflux disease), History of hiatal hernia, Hypertension, Osteoarthritis of lumbar spine, unspecified spinal osteoarthritis complication status, Pulmonary embolism (HCC) (01/15/2015), Rheumatoid factor positive, and Sleep apnea. she  has a past surgical history that includes Heel spur surgery (Bilateral); Breast surgery (2000); Rotator cuff repair (Right, 2009); Tonsillectomy; Tubal ligation; Gastric Roux-En-Y (N/A, 11/01/2015); Reduction mammaplasty (Bilateral, 2000); Cholecystectomy; IVC FILTER INSERTION (N/A, 01/19/2019); Total knee arthroplasty (Right, 01/21/2019); IVC FILTER REMOVAL (04/16/2019); IVC FILTER REMOVAL (N/A, 04/16/2019); Breast biopsy (Bilateral); IVC FILTER INSERTION (N/A, 04/15/2023); Cesarean section; Knee Arthroplasty (Left, 05/15/2023); and IVC FILTER REMOVAL (N/A, 07/29/2023) are also affecting patient's functional outcome.   REHAB POTENTIAL: Good  CLINICAL DECISION MAKING: Evolving/moderate complexity  EVALUATION  COMPLEXITY: Moderate  GOALS: Goals reviewed with patient? No  SHORT TERM GOALS: Target date: 10/20/2024  Patient will be independent with initial home exercise program for self-management of symptoms. Baseline: Initial HEP provided at IE (10/06/24); Goal status: In progress  LONG TERM GOALS: Target date: 12/29/2024  Patient will be independent with a long-term home exercise program for self-management of symptoms.  Baseline: Initial HEP provided at IE (10/06/24); Goal status: In progress  2.  Patient will demonstrate improved Modified Oswestry Disability Index (mODI) to equal or less than 10% to demonstrate improvement in overall condition and self-reported functional ability.  Baseline: 34% (10/06/24); Goal status: In progress  3.  Patient will demonstrate the ability to ambulate 1500 feet without antalgic gait or increased pain during 6 Minute Walk Test to demonstrate improved community ambulation towards age matched norma of 1765 feet for healthy adult female and to help her perform work and leisure activities with less difficulty.   Baseline: currently antalgic gait walking short distances (< 100 feet) in clinic with complaint of pain and difficulty walking down the hallway during work activities (10/06/24); Goal status: In progress  4.  Patient will demonstrate full available lumbar AROM without increased symptoms except intermittent end range discomfort to improve her ability to complete ADLs, work, and leisure activities.  Baseline: painful and limited - see objective (10/06/24); Goal status: In progress  5.  Patient will report NPRS equal or less than 3/10 during functional activities during the last 2 weeks to improve their abilitly to complete community, work and/or recreational activities with less limitation. Baseline: 8/10 (10/06/24); Goal status: In progress  PLAN:  PT FREQUENCY: 2x/week  PT DURATION: 8-12 weeks  PLANNED INTERVENTIONS: 97164- PT Re-evaluation,  97750- Physical Performance Testing, 97110-Therapeutic exercises, 97530- Therapeutic activity, [id number]- Neuromuscular re-education, 97535- Self Care, 02859- Manual therapy, [phone]- Gait training, [id number]- Aquatic Therapy, [phone]- Electrical stimulation (unattended), [phone] (1-2 muscles), 20561 (3+ muscles)- Dry Needling, Patient/Family education, Balance training, Stair training, Cryotherapy, and Moist heat.  PLAN FOR NEXT SESSION: *** She would likely benefit from learning self hip flexor stretch and ability to stabilize lumbar spine away from irritating extension during functional activities, while improving motion in all directions with intermittent stretching.  Vernell Rise, SPT 11/09/24

## 2024-11-10 NOTE — Therapy (Deleted)
 OUTPATIENT PHYSICAL THERAPY TREATMENT   Patient Name: ELOINA ERGLE MRN: [id number] DOB:09-05-1961, 63 y.o., female Today's Date: 11/10/2024  END OF SESSION:     Past Medical History:  Diagnosis Date   Acute right-sided low back pain    Anemia    Arthritis    Asthma    Complication of anesthesia    Woke during IVC filter removal   Diabetes mellitus without complication (HCC)    Difficult intubation    pt denies   DVT, lower extremity (HCC) 01/15/2015   right leg that went to lung   GERD (gastroesophageal reflux disease)    History of hiatal hernia    Hypertension    Osteoarthritis of lumbar spine, unspecified spinal osteoarthritis complication status    Pulmonary embolism (HCC) 01/15/2015   Rheumatoid factor positive    Sleep apnea    Past Surgical History:  Procedure Laterality Date   BREAST BIOPSY Bilateral    [facility] neg [facility]    BREAST SURGERY  2000   Reduction   CESAREAN SECTION     CHOLECYSTECTOMY     COLONOSCOPY N/A 04/08/2015   Procedure: COLONOSCOPY;  Surgeon: Rogelia Copping, MD;  Location: [facility];  Service: Gastroenterology;  Laterality: N/A;   COLONOSCOPY WITH PROPOFOL  N/A 09/29/2020   Procedure: COLONOSCOPY WITH PROPOFOL ;  Surgeon: Unk Corinn Skiff, MD;  Location: [facility];  Service: Endoscopy;  Laterality: N/A;   ESOPHAGOGASTRODUODENOSCOPY (EGD) WITH PROPOFOL  N/A 07/07/2015   Procedure: ESOPHAGOGASTRODUODENOSCOPY (EGD) WITH PROPOFOL ;  Surgeon: Rogelia Copping, MD;  Location: [facility];  Service: Endoscopy;  Laterality: N/A;   ESOPHAGOGASTRODUODENOSCOPY (EGD) WITH PROPOFOL  N/A 09/29/2020   Procedure: ESOPHAGOGASTRODUODENOSCOPY (EGD) WITH PROPOFOL ;  Surgeon: Unk Corinn Skiff, MD;  Location: [facility];  Service: Endoscopy;  Laterality: N/A;   GASTRIC ROUX-EN-Y N/A 11/01/2015   Procedure: LAPAROSCOPIC ROUX-EN-Y GASTRIC, HIATAL HERNIA REPAIR, EXTENSIVE LYSIS OF ADHESIONS;  Surgeon: Ozell DELENA Mano, MD;   Location: [facility];  Service: General;  Laterality: N/A;   HEEL SPUR SURGERY Bilateral    IVC FILTER INSERTION N/A 01/19/2019   Procedure: IVC FILTER INSERTION;  Surgeon: Marea Selinda RAMAN, MD;  Location: [facility];  Service: Cardiovascular;  Laterality: N/A;   IVC FILTER INSERTION N/A 04/15/2023   Procedure: IVC FILTER INSERTION;  Surgeon: Marea Selinda RAMAN, MD;  Location: [facility];  Service: Cardiovascular;  Laterality: N/A;   IVC FILTER REMOVAL  04/16/2019   IVC FILTER REMOVAL N/A 04/16/2019   Procedure: IVC FILTER REMOVAL;  Surgeon: Marea Selinda RAMAN, MD;  Location: [facility];  Service: Cardiovascular;  Laterality: N/A;   IVC FILTER REMOVAL N/A 07/29/2023   Procedure: IVC FILTER REMOVAL;  Surgeon: Marea Selinda RAMAN, MD;  Location: [facility];  Service: Cardiovascular;  Laterality: N/A;   KNEE ARTHROPLASTY Left 05/15/2023   Procedure: COMPUTER ASSISTED TOTAL KNEE ARTHROPLASTY;  Surgeon: Mardee Lynwood SQUIBB, MD;  Location: [facility];  Service: Orthopedics;  Laterality: Left;   REDUCTION MAMMAPLASTY Bilateral 2000   ROTATOR CUFF REPAIR Right 2009   TONSILLECTOMY     TOTAL KNEE ARTHROPLASTY Right 01/21/2019   Procedure: TOTAL KNEE ARTHROPLASTY with navigation;  Surgeon: Mardee Lynwood SQUIBB, MD;  Location: [facility];  Service: Orthopedics;  Laterality: Right;   TUBAL LIGATION     Patient Active Problem List   Diagnosis Date Noted   Esophageal dysphagia 01/04/2022   Iron deficiency 01/04/2022   Vomiting 01/04/2022   Constipation 03/24/2021   Bloating 03/22/2021  Nausea 03/22/2021   Iron deficiency anemia    BMI 33.0-33.9,adult 09/08/2020   Panniculitis 03/23/2020   Generalized abdominal pain 10/08/2019   History of Roux-en-Y gastric bypass 09/16/2019   Chronic shoulder bursitis, right 05/06/2019   Chronic right shoulder pain 04/20/2019   S/P IVC filter 03/03/2019   Status post total left knee replacement 01/21/2019   DDD (degenerative disc disease), lumbar 03/31/2018    Rheumatoid factor positive 03/31/2018   Swelling of limb 07/12/2017   Varicose veins of leg with pain, bilateral 07/12/2017   Pulmonary emboli (HCC) 11/15/2015   Positive Helicobacter pylori serology 11/03/2015   Bariatric surgery status 11/01/2015   Primary osteoarthritis of left knee 09/27/2015   Primary osteoarthritis of right knee 09/27/2015   Diabetes mellitus, type 2 (HCC) 07/13/2015   Arthritis, degenerative 07/13/2015   Hypertensive disorder 06/15/2015   Asthma 12/07/2013   Carpal tunnel syndrome 12/07/2013   Essential (primary) hypertension 09/14/2009   PCP: Tobie Domino, MD  REFERRING PROVIDER: Janith Drivers, DO  REFERRING DIAG: left lumbar radiculitis  Rationale for Evaluation and Treatment: Rehabilitation  THERAPY DIAG:  No diagnosis found.  ONSET DATE: following L TKA on 05/15/2023  SUBJECTIVE:                                                                                                                                                                                           PERTINENT HISTORY:  Patient is a 63 y.o. female who presents to outpatient physical therapy with a referral for medical diagnosis left lumbar radiculitis. This patient's chief complaints consist of left low back pain radiating with pain and numbness into left lower leg and difficulty bending to the right leading to the following functional deficits: difficulty with usual activities including working, standing up from sitting, standing, prolonged sitting, hiking, biking, sleeping, lifting, self care, housework, cooking, leisure activities. Relevant past medical history and comorbidities include the following: she has Diabetes mellitus, type 2 (HCC); Arthritis, degenerative; Bariatric surgery status; Pulmonary emboli (HCC); Swelling of limb; Varicose veins of leg with pain, bilateral; Positive Helicobacter pylori serology; Asthma; Carpal tunnel syndrome; DDD (degenerative disc disease), lumbar;  Essential (primary) hypertension; Hypertensive disorder; Primary osteoarthritis of left knee; Primary osteoarthritis of right knee; Rheumatoid factor positive; Status post total left knee replacement; S/P IVC filter; BMI 33.0-33.9,adult; Chronic right shoulder pain; Chronic shoulder bursitis, right; History of Roux-en-Y gastric bypass; Panniculitis; Iron deficiency anemia; Esophageal dysphagia; Generalized abdominal pain; Iron deficiency; Bloating; Constipation; Nausea; and Vomiting on their problem list. she  has a past medical history of Acute right-sided low back pain, Anemia, Arthritis, Asthma, Complication of anesthesia, Diabetes mellitus without complication (HCC),  DVT, lower extremity (HCC) (01/15/2015), GERD (gastroesophageal reflux disease), History of hiatal hernia, Hypertension, Osteoarthritis of lumbar spine, unspecified spinal osteoarthritis complication status, Pulmonary embolism (HCC) (01/15/2015), Rheumatoid factor positive, and Sleep apnea. she  has a past surgical history that includes Heel spur surgery (Bilateral); Breast surgery (2000); Rotator cuff repair (Right, 2009); Tonsillectomy; Tubal ligation; Gastric Roux-En-Y (N/A, 11/01/2015); Reduction mammaplasty (Bilateral, 2000); Cholecystectomy; IVC FILTER INSERTION (N/A, 01/19/2019); Total knee arthroplasty (Right, 01/21/2019); IVC FILTER REMOVAL (04/16/2019); IVC FILTER REMOVAL (N/A, 04/16/2019); Breast biopsy (Bilateral); IVC FILTER INSERTION (N/A, 04/15/2023); Cesarean section; Knee Arthroplasty (Left, 05/15/2023); and IVC FILTER REMOVAL (N/A, 07/29/2023). Patient denies hx of cancer, stroke, seizures, lung problems, heart problems, unexplained weight loss, unexplained changes in bowel or bladder problems, unexplained stumbling or dropping things, and spinal surgery  Exercise history: Biking 20 miles a week with a group prior to L TKA, hiking  SUBJECTIVE STATEMENT: ***  PAIN: NPRS:***  From initial PT evaluation on 10/06/2024:  NPRS:  Current: 5/10,  Best: 3/10, Worst: 8/10. Pain location: pain left lower back, left glute, down back of leg, intermittently to medial lower leg. Numbness in L lower leg (stops mid lower leg). Pain description: pinching in the left lumbar spine radiating to the L leg, Numbness in L lower leg (stops mid lower leg). Aggravating factors: see below Relieving factors: see below   PRECAUTIONS: None  WEIGHT BEARING RESTRICTIONS: No  FALLS:  Has patient fallen in last 6 months? Yes. Number of falls 1 when she had her son over and she wanted to clean under her matress. She tripped over the the bed frame when stepping in to vacuum.   PATIENT GOALS: To really work on walking and get me back up, to loosen up my back and knee, so I can get back mobile and moving like I was  OBJECTIVE  Movement Loss Movement Loss Symptoms  Flexion {MajModMinNil:32701} ***  Extension {MajModMinNil:32701} ***  Side Gliding R {MajModMinNil:32701} ***  Side Gliding L {MajModMinNil:32701} ***   Repeated Motions  Sx pre test: *** Test Movement Symptom During Symptom After Mechanical Response Key Functional Test  L side glide 1 x 10  {ProducesAbolishesIncreases:32702} {BetterWorseNobetterNoworse:32703} {IncreasedROMDecreasedROMNoEffect:32704} {BetterWorseNoEffect:32699}   TREATMENT                                                                                                                            Therapeutic exercise: therapeutic exercises that incorporate ONE parameter at one or more areas of the body to centralize symptoms, develop strength and endurance, range of motion, and flexibility required for successful completion of functional activities.  Standing hip flexor stretch  2 x 30 seconds each side  Supine B piriformis  2 x 30 seconds each side  Repeated motions (see above)  Neuromuscular Re-education: a technique or exercise performed with the goal of improving the level of communication between the  body and the brain, such as for balance, motor control, muscle activation patterns, coordination, desensitization, quality of muscle contraction,  proprioception, and/or kinesthetic sense needed for successful and safe completion of functional activities.   Hooklying PPT in a pain free range of motion for improved lumbo pelvic control in    2 x 10  Hooklying PPT with B arm raise for maintained motor control of pelvis with movement of UE   2 x 10  Standing resisted pallof press with RTB and with PPT to improve core strengthen while maintain lumbo pelvic control   2 x 10 each side  Pt required multimodal cuing for proper technique and to facilitate improved neuromuscular control, strength, range of motion, and functional ability resulting in improved performance and form.  PATIENT EDUCATION:  Education details: educated on peripheralization, centralization, POC, avoiding flexion, avoiding nerve tension positions, HEP Person educated: Patient Education method: Programmer, Multimedia, Demonstration, and Handouts Education comprehension: verbalized understanding, returned demonstration, and needs further education  HOME EXERCISE PROGRAM: Access Code: [email or web address] URL: [email or web address] Date: 10/07/2024 Prepared by: Camie Cleverly  Exercises - Seated Correct Posture  - Standing Lumbar Extension with Counter  - 1 sets - 15 reps - 1 second hold - every 2 hours frequency  ASSESSMENT:  CLINICAL IMPRESSION: ***  Patient would benefit from continued management of limiting condition by skilled physical therapist to address remaining impairments and functional limitations to work towards stated goals and return to PLOF or maximal functional independence.    MDT Assessment Provisional Classification: OTHER: spinal stenosis vs inflammatory arthropathy vs unclassified.   From initial PT evaluation on 10/06/2024:  Patient is a 63 y.o. female referred to outpatient physical therapy with a medical  diagnosis of left lumbar radiculitis and request for PT evaluation using Mechanical Diagnosis and Therapy (MDT/McKenzie) who presents with signs and symptoms consistent with chronic left sided low back pain with L radiculopathy. MDT provisional classification of lumbar derangement syndrome with extension preference vs flexion preference vs OTHER. Patient presents with significant pain, ROM, joint stiffness, neural tension, paresthesia, muscle performance (strength/power/endurance), gait, posture, balance, and activity tolerance impairments that are limiting ability to complete usual activities including working, standing up from sitting, standing, prolonged sitting, hiking, biking, sleeping, lifting, self care, housework, cooking, leisure activities without difficulty. Patient will benefit from skilled physical therapy intervention to address current body structure impairments and activity limitations to improve function and work towards goals set in current POC in order to return to prior level of function or maximal functional improvement.   OBJECTIVE IMPAIRMENTS: Abnormal gait, decreased activity tolerance, decreased balance, decreased endurance, decreased knowledge of condition, decreased mobility, difficulty walking, decreased ROM, decreased strength, hypomobility, increased muscle spasms, impaired flexibility, improper body mechanics, postural dysfunction, and pain.   ACTIVITY LIMITATIONS: carrying, lifting, bending, sitting, standing, squatting, sleeping, stairs, transfers, bed mobility, and locomotion level  PARTICIPATION LIMITATIONS: meal prep, cleaning, laundry, interpersonal relationship, driving, shopping, community activity, occupation, and cycling, hiking  PERSONAL FACTORS: Age, Past/current experiences, Time since onset of injury/illness/exacerbation, and 3+ comorbidities:  Diabetes mellitus, type 2 (HCC); Arthritis, degenerative; Bariatric surgery status; Pulmonary emboli (HCC); Swelling of  limb; Varicose veins of leg with pain, bilateral; Positive Helicobacter pylori serology; Asthma; Carpal tunnel syndrome; DDD (degenerative disc disease), lumbar; Essential (primary) hypertension; Hypertensive disorder; Primary osteoarthritis of left knee; Primary osteoarthritis of right knee; Rheumatoid factor positive; Status post total left knee replacement; S/P IVC filter; BMI 33.0-33.9,adult; Chronic right shoulder pain; Chronic shoulder bursitis, right; History of Roux-en-Y gastric bypass; Panniculitis; Iron deficiency anemia; Esophageal dysphagia; Generalized abdominal pain; Iron deficiency; Bloating; Constipation; Nausea; and Vomiting on their problem list. she  has  a past medical history of Acute right-sided low back pain, Anemia, Arthritis, Asthma, Complication of anesthesia, Diabetes mellitus without complication (HCC), DVT, lower extremity (HCC) (01/15/2015), GERD (gastroesophageal reflux disease), History of hiatal hernia, Hypertension, Osteoarthritis of lumbar spine, unspecified spinal osteoarthritis complication status, Pulmonary embolism (HCC) (01/15/2015), Rheumatoid factor positive, and Sleep apnea. she  has a past surgical history that includes Heel spur surgery (Bilateral); Breast surgery (2000); Rotator cuff repair (Right, 2009); Tonsillectomy; Tubal ligation; Gastric Roux-En-Y (N/A, 11/01/2015); Reduction mammaplasty (Bilateral, 2000); Cholecystectomy; IVC FILTER INSERTION (N/A, 01/19/2019); Total knee arthroplasty (Right, 01/21/2019); IVC FILTER REMOVAL (04/16/2019); IVC FILTER REMOVAL (N/A, 04/16/2019); Breast biopsy (Bilateral); IVC FILTER INSERTION (N/A, 04/15/2023); Cesarean section; Knee Arthroplasty (Left, 05/15/2023); and IVC FILTER REMOVAL (N/A, 07/29/2023) are also affecting patient's functional outcome.   REHAB POTENTIAL: Good  CLINICAL DECISION MAKING: Evolving/moderate complexity  EVALUATION COMPLEXITY: Moderate  GOALS: Goals reviewed with patient? No  SHORT TERM GOALS:  Target date: 10/20/2024  Patient will be independent with initial home exercise program for self-management of symptoms. Baseline: Initial HEP provided at IE (10/06/24); Goal status: In progress  LONG TERM GOALS: Target date: 12/29/2024  Patient will be independent with a long-term home exercise program for self-management of symptoms.  Baseline: Initial HEP provided at IE (10/06/24); Goal status: In progress  2.  Patient will demonstrate improved Modified Oswestry Disability Index (mODI) to equal or less than 10% to demonstrate improvement in overall condition and self-reported functional ability.  Baseline: 34% (10/06/24); Goal status: In progress  3.  Patient will demonstrate the ability to ambulate 1500 feet without antalgic gait or increased pain during 6 Minute Walk Test to demonstrate improved community ambulation towards age matched norma of 1765 feet for healthy adult female and to help her perform work and leisure activities with less difficulty.   Baseline: currently antalgic gait walking short distances (< 100 feet) in clinic with complaint of pain and difficulty walking down the hallway during work activities (10/06/24); Goal status: In progress  4.  Patient will demonstrate full available lumbar AROM without increased symptoms except intermittent end range discomfort to improve her ability to complete ADLs, work, and leisure activities.  Baseline: painful and limited - see objective (10/06/24); Goal status: In progress  5.  Patient will report NPRS equal or less than 3/10 during functional activities during the last 2 weeks to improve their abilitly to complete community, work and/or recreational activities with less limitation. Baseline: 8/10 (10/06/24); Goal status: In progress  PLAN:  PT FREQUENCY: 2x/week  PT DURATION: 8-12 weeks  PLANNED INTERVENTIONS: 97164- PT Re-evaluation, 97750- Physical Performance Testing, 97110-Therapeutic exercises, 97530- Therapeutic  activity, [id number]- Neuromuscular re-education, 97535- Self Care, [id number]- Manual therapy, [phone]- Gait training, [phone]- Aquatic Therapy, [phone]- Electrical stimulation (unattended), [phone] (1-2 muscles), 20561 (3+ muscles)- Dry Needling, Patient/Family education, Balance training, Stair training, Cryotherapy, and Moist heat.  PLAN FOR NEXT SESSION: *** She would likely benefit from learning self hip flexor stretch and ability to stabilize lumbar spine away from irritating extension during functional activities, while improving motion in all directions with intermittent stretching.  Vernell Rise, SPT 11/10/24

## 2024-11-11 ENCOUNTER — Telehealth: Payer: Self-pay | Admitting: Physical Therapy

## 2024-11-11 ENCOUNTER — Ambulatory Visit: Admitting: Physical Therapy

## 2024-11-11 NOTE — Telephone Encounter (Signed)
 Called to notify patient of missed PT visit scheduled at 5:30pm today. Requested they call back to reschedule, confirm next appointment, or let us  know of any changes in PT plans. Let patient know that with any no-show I am required to review our cancellation policy that after 2 no-shows we remove future visits from the schedule, they are responsible to calling in to reschedule, and they will only be able to schedule one appointment at a time and/or we may remove a patient from the schedule.   Vernell Rise, SPT 11/11/24  [facility] [address] [place], [place] [address] P: [phone] I F: [phone]

## 2024-11-12 NOTE — Therapy (Unsigned)
 OUTPATIENT PHYSICAL THERAPY TREATMENT   Patient Name: Erica Powers MRN: [id number] DOB:09-13-1961, 63 y.o., female Today's Date: 11/16/2024  END OF SESSION:  PT End of Session - 11/16/24 1730     Visit Number 3    Number of Visits 17    Date for Recertification  12/29/24    Authorization Type [employer] reporting period from 10/06/2024    Authorization Time Period VL 60 PT/OT-43 remain    Authorization - Number of Visits 43    Progress Note Due on Visit 10    PT Start Time 1730    PT Stop Time 1812    PT Time Calculation (min) 42 min    Activity Tolerance Patient tolerated treatment well    Behavior During Therapy WFL for tasks assessed/performed         Past Medical History:  Diagnosis Date   Acute right-sided low back pain    Anemia    Arthritis    Asthma    Complication of anesthesia    Woke during IVC filter removal   Diabetes mellitus without complication (HCC)    Difficult intubation    pt denies   DVT, lower extremity (HCC) 01/15/2015   right leg that went to lung   GERD (gastroesophageal reflux disease)    History of hiatal hernia    Hypertension    Osteoarthritis of lumbar spine, unspecified spinal osteoarthritis complication status    Pulmonary embolism (HCC) 01/15/2015   Rheumatoid factor positive    Sleep apnea    Past Surgical History:  Procedure Laterality Date   BREAST BIOPSY Bilateral    bilat bx done neg at [facility]    BREAST SURGERY  2000   Reduction   CESAREAN SECTION     CHOLECYSTECTOMY     COLONOSCOPY N/A 04/08/2015   Procedure: COLONOSCOPY;  Surgeon: Rogelia Copping, MD;  Location: [facility];  Service: Gastroenterology;  Laterality: N/A;   COLONOSCOPY WITH PROPOFOL  N/A 09/29/2020   Procedure: COLONOSCOPY WITH PROPOFOL ;  Surgeon: Unk Corinn Skiff, MD;  Location: [facility];  Service: Endoscopy;  Laterality: N/A;   ESOPHAGOGASTRODUODENOSCOPY (EGD) WITH PROPOFOL  N/A 07/07/2015   Procedure:  ESOPHAGOGASTRODUODENOSCOPY (EGD) WITH PROPOFOL ;  Surgeon: Rogelia Copping, MD;  Location: [facility];  Service: Endoscopy;  Laterality: N/A;   ESOPHAGOGASTRODUODENOSCOPY (EGD) WITH PROPOFOL  N/A 09/29/2020   Procedure: ESOPHAGOGASTRODUODENOSCOPY (EGD) WITH PROPOFOL ;  Surgeon: Unk Corinn Skiff, MD;  Location: [facility];  Service: Endoscopy;  Laterality: N/A;   GASTRIC ROUX-EN-Y N/A 11/01/2015   Procedure: LAPAROSCOPIC ROUX-EN-Y GASTRIC, HIATAL HERNIA REPAIR, EXTENSIVE LYSIS OF ADHESIONS;  Surgeon: Ozell DELENA Mano, MD;  Location: [facility];  Service: General;  Laterality: N/A;   HEEL SPUR SURGERY Bilateral    IVC FILTER INSERTION N/A 01/19/2019   Procedure: IVC FILTER INSERTION;  Surgeon: Marea Selinda RAMAN, MD;  Location: [facility];  Service: Cardiovascular;  Laterality: N/A;   IVC FILTER INSERTION N/A 04/15/2023   Procedure: IVC FILTER INSERTION;  Surgeon: Marea Selinda RAMAN, MD;  Location: [facility];  Service: Cardiovascular;  Laterality: N/A;   IVC FILTER REMOVAL  04/16/2019   IVC FILTER REMOVAL N/A 04/16/2019   Procedure: IVC FILTER REMOVAL;  Surgeon: Marea Selinda RAMAN, MD;  Location: [facility];  Service: Cardiovascular;  Laterality: N/A;   IVC FILTER REMOVAL N/A 07/29/2023   Procedure: IVC FILTER REMOVAL;  Surgeon: Marea Selinda RAMAN, MD;  Location: [facility];  Service: Cardiovascular;  Laterality: N/A;  KNEE ARTHROPLASTY Left 05/15/2023   Procedure: COMPUTER ASSISTED TOTAL KNEE ARTHROPLASTY;  Surgeon: Mardee Lynwood SQUIBB, MD;  Location: [facility];  Service: Orthopedics;  Laterality: Left;   REDUCTION MAMMAPLASTY Bilateral 2000   ROTATOR CUFF REPAIR Right 2009   TONSILLECTOMY     TOTAL KNEE ARTHROPLASTY Right 01/21/2019   Procedure: TOTAL KNEE ARTHROPLASTY with navigation;  Surgeon: Mardee Lynwood SQUIBB, MD;  Location: [facility];  Service: Orthopedics;  Laterality: Right;   TUBAL LIGATION     Patient Active Problem List   Diagnosis Date Noted   Esophageal  dysphagia 01/04/2022   Iron deficiency 01/04/2022   Vomiting 01/04/2022   Constipation 03/24/2021   Bloating 03/22/2021   Nausea 03/22/2021   Iron deficiency anemia    BMI 33.0-33.9,adult 09/08/2020   Panniculitis 03/23/2020   Generalized abdominal pain 10/08/2019   History of Roux-en-Y gastric bypass 09/16/2019   Chronic shoulder bursitis, right 05/06/2019   Chronic right shoulder pain 04/20/2019   S/P IVC filter 03/03/2019   Status post total left knee replacement 01/21/2019   DDD (degenerative disc disease), lumbar 03/31/2018   Rheumatoid factor positive 03/31/2018   Swelling of limb 07/12/2017   Varicose veins of leg with pain, bilateral 07/12/2017   Pulmonary emboli (HCC) 11/15/2015   Positive Helicobacter pylori serology 11/03/2015   Bariatric surgery status 11/01/2015   Primary osteoarthritis of left knee 09/27/2015   Primary osteoarthritis of right knee 09/27/2015   Diabetes mellitus, type 2 (HCC) 07/13/2015   Arthritis, degenerative 07/13/2015   Hypertensive disorder 06/15/2015   Asthma 12/07/2013   Carpal tunnel syndrome 12/07/2013   Essential (primary) hypertension 09/14/2009   PCP: Tobie Domino, MD  REFERRING PROVIDER: Janith Drivers, DO  REFERRING DIAG: left lumbar radiculitis  Rationale for Evaluation and Treatment: Rehabilitation  THERAPY DIAG:  Other low back pain  Radiculopathy, lumbar region  Difficulty in walking, not elsewhere classified  ONSET DATE: following L TKA on 05/15/2023  SUBJECTIVE:                                                                                                                                                                                           PERTINENT HISTORY:  Patient is a 63 y.o. female who presents to outpatient physical therapy with a referral for medical diagnosis left lumbar radiculitis. This patient's chief complaints consist of left low back pain radiating with pain and numbness into left lower leg and  difficulty bending to the right leading to the following functional deficits: difficulty with usual activities including working, standing up from sitting, standing, prolonged sitting, hiking, biking, sleeping, lifting, self care, housework, cooking, leisure activities. Relevant past medical  history and comorbidities include the following: she has Diabetes mellitus, type 2 (HCC); Arthritis, degenerative; Bariatric surgery status; Pulmonary emboli (HCC); Swelling of limb; Varicose veins of leg with pain, bilateral; Positive Helicobacter pylori serology; Asthma; Carpal tunnel syndrome; DDD (degenerative disc disease), lumbar; Essential (primary) hypertension; Hypertensive disorder; Primary osteoarthritis of left knee; Primary osteoarthritis of right knee; Rheumatoid factor positive; Status post total left knee replacement; S/P IVC filter; BMI 33.0-33.9,adult; Chronic right shoulder pain; Chronic shoulder bursitis, right; History of Roux-en-Y gastric bypass; Panniculitis; Iron deficiency anemia; Esophageal dysphagia; Generalized abdominal pain; Iron deficiency; Bloating; Constipation; Nausea; and Vomiting on their problem list. she  has a past medical history of Acute right-sided low back pain, Anemia, Arthritis, Asthma, Complication of anesthesia, Diabetes mellitus without complication (HCC), DVT, lower extremity (HCC) (01/15/2015), GERD (gastroesophageal reflux disease), History of hiatal hernia, Hypertension, Osteoarthritis of lumbar spine, unspecified spinal osteoarthritis complication status, Pulmonary embolism (HCC) (01/15/2015), Rheumatoid factor positive, and Sleep apnea. she  has a past surgical history that includes Heel spur surgery (Bilateral); Breast surgery (2000); Rotator cuff repair (Right, 2009); Tonsillectomy; Tubal ligation; Gastric Roux-En-Y (N/A, 11/01/2015); Reduction mammaplasty (Bilateral, 2000); Cholecystectomy; IVC FILTER INSERTION (N/A, 01/19/2019); Total knee arthroplasty (Right,  01/21/2019); IVC FILTER REMOVAL (04/16/2019); IVC FILTER REMOVAL (N/A, 04/16/2019); Breast biopsy (Bilateral); IVC FILTER INSERTION (N/A, 04/15/2023); Cesarean section; Knee Arthroplasty (Left, 05/15/2023); and IVC FILTER REMOVAL (N/A, 07/29/2023). Patient denies hx of cancer, stroke, seizures, lung problems, heart problems, unexplained weight loss, unexplained changes in bowel or bladder problems, unexplained stumbling or dropping things, and spinal surgery  Exercise history: Biking 20 miles a week with a group prior to L TKA, hiking  SUBJECTIVE STATEMENT: Patient states she has been feeling good, stretching still feels stiff and feels like she is walking better. Occasional pain down the L side hamstring to behind knee.  PAIN: NPRS:L sided low back stiffness, no pain  From initial PT evaluation on 10/06/2024:  NPRS: Current: 5/10,  Best: 3/10, Worst: 8/10. Pain location: pain left lower back, left glute, down back of leg, intermittently to medial lower leg. Numbness in L lower leg (stops mid lower leg). Pain description: pinching in the left lumbar spine radiating to the L leg, Numbness in L lower leg (stops mid lower leg). Aggravating factors: see below Relieving factors: see below   PRECAUTIONS: None  WEIGHT BEARING RESTRICTIONS: No  FALLS:  Has patient fallen in last 6 months? Yes. Number of falls 1 when she had her son over and she wanted to clean under her matress. She tripped over the the bed frame when stepping in to vacuum.   PATIENT GOALS: To really work on walking and get me back up, to loosen up my back and knee, so I can get back mobile and moving like I was  OBJECTIVE  Movement Loss Movement Loss Symptoms  Flexion min Pain upon returning to neutral  Extension mod Pain in L glute  Side Gliding R mod Pain in the L sided low back  Side Gliding L maj Pain in the L side low back   Repeated Motions Testing Pre-test Symptoms Test Movement Symptom During Symptom After Mechanical  Response Key Functional Test  L low back feels a little tender      L Side Glide  2 x 10 1) Feels better when she pushes toward the wall and feels it when she come back to neutral  2) feels like a stretch when pushing toward the wall, feels a catch in L low back upon returning  1) no worse 2) tingling low back 1) no effect 2) no effect    TREATMENT                                                                                                                            Therapeutic exercise: therapeutic exercises that incorporate ONE parameter at one or more areas of the body to centralize symptoms, develop strength and endurance, range of motion, and flexibility required for successful completion of functional activities.  Standing hip flexor stretch  2 x 15 seconds each side  Supine B piriformis  2 x 30 seconds each side  Standing lumbar extension with back against mat table  2 x 10  Repeated motions testing (see above)  Neuromuscular Re-education: a technique or exercise performed with the goal of improving the level of communication between the body and the brain, such as for balance, motor control, muscle activation patterns, coordination, desensitization, quality of muscle contraction, proprioception, and/or kinesthetic sense needed for successful and safe completion of functional activities.   Hooklying PPT in a pain free range of motion for improved lumbo pelvic control in    2 x 10   Added to HEP  Hooklying PPT with B arm raise for maintained motor control of pelvis with movement of UE   2 x 10  Standing resisted pallof press with GTB and with PPT to improve core strengthen while maintain lumbo pelvic control   1 x 10 R with RTB and 3 sec hold   1 x 10 R with GTB and 3 second hold   2 x 10 L with GTB and 3 seconds hold    Pt required multimodal cuing for proper technique and to facilitate improved neuromuscular control, strength, range of motion, and functional  ability resulting in improved performance and form.  42 min total: 23 min TE, 19 min NR  PATIENT EDUCATION:  Education details: educated on peripheralization, centralization, POC, avoiding flexion, avoiding nerve tension positions, HEP Person educated: Patient Education method: Programmer, Multimedia, Demonstration, and Handouts Education comprehension: verbalized understanding, returned demonstration, and needs further education  HOME EXERCISE PROGRAM: Access Code: [email or web address] URL: [email or web address] Date: 11/16/2024 Prepared by: Vernell Rise  Exercises - Seated Correct Posture  - Standing Lumbar Extension with Counter  - 1 sets - 15 reps - 1 second hold - every 2 hours frequency - Supine Posterior Pelvic Tilt  - 1 x daily - 2 sets - 10 reps - Standing Hip Flexor Stretch  - 1 x daily - 2 sets - 15 second hold - Supine Piriformis Stretch  - 1 x daily - 2 sets - 30 seconds hold  ASSESSMENT:  CLINICAL IMPRESSION: Patient noted no change in symptoms after repeated L slide glide the first time and tingling the second when pushing harder. Patient noted with the supine piriformis stretch on the L was stretching the spot that was bothering her. When completing standing lumbar extensions patient noted no catch upon returning to neutral  and it just felt like a good stretch. Progressed standing pallof press to GTB for increased resistance, patient noted more fatigue with GTB but was still able to complete all sets. Completed standing hip flexor stretch with R UE support on counter. Patient required cueing to stand up tall and bend front knee. Patient reported tenderness in L sided low back is a bit more aggravated than when she came in but still denies pain and distal symptoms. Patient would benefit from continued management of limiting condition by skilled physical therapist to address remaining impairments and functional limitations to work towards stated goals and return to PLOF or maximal  functional independence.    MDT Assessment Provisional Classification: OTHER: spinal stenosis vs inflammatory arthropathy vs unclassified.   From initial PT evaluation on 10/06/2024:  Patient is a 63 y.o. female referred to outpatient physical therapy with a medical diagnosis of left lumbar radiculitis and request for PT evaluation using Mechanical Diagnosis and Therapy (MDT/McKenzie) who presents with signs and symptoms consistent with chronic left sided low back pain with L radiculopathy. MDT provisional classification of lumbar derangement syndrome with extension preference vs flexion preference vs OTHER. Patient presents with significant pain, ROM, joint stiffness, neural tension, paresthesia, muscle performance (strength/power/endurance), gait, posture, balance, and activity tolerance impairments that are limiting ability to complete usual activities including working, standing up from sitting, standing, prolonged sitting, hiking, biking, sleeping, lifting, self care, housework, cooking, leisure activities without difficulty. Patient will benefit from skilled physical therapy intervention to address current body structure impairments and activity limitations to improve function and work towards goals set in current POC in order to return to prior level of function or maximal functional improvement.   OBJECTIVE IMPAIRMENTS: Abnormal gait, decreased activity tolerance, decreased balance, decreased endurance, decreased knowledge of condition, decreased mobility, difficulty walking, decreased ROM, decreased strength, hypomobility, increased muscle spasms, impaired flexibility, improper body mechanics, postural dysfunction, and pain.   ACTIVITY LIMITATIONS: carrying, lifting, bending, sitting, standing, squatting, sleeping, stairs, transfers, bed mobility, and locomotion level  PARTICIPATION LIMITATIONS: meal prep, cleaning, laundry, interpersonal relationship, driving, shopping, community activity,  occupation, and cycling, hiking  PERSONAL FACTORS: Age, Past/current experiences, Time since onset of injury/illness/exacerbation, and 3+ comorbidities:  Diabetes mellitus, type 2 (HCC); Arthritis, degenerative; Bariatric surgery status; Pulmonary emboli (HCC); Swelling of limb; Varicose veins of leg with pain, bilateral; Positive Helicobacter pylori serology; Asthma; Carpal tunnel syndrome; DDD (degenerative disc disease), lumbar; Essential (primary) hypertension; Hypertensive disorder; Primary osteoarthritis of left knee; Primary osteoarthritis of right knee; Rheumatoid factor positive; Status post total left knee replacement; S/P IVC filter; BMI 33.0-33.9,adult; Chronic right shoulder pain; Chronic shoulder bursitis, right; History of Roux-en-Y gastric bypass; Panniculitis; Iron deficiency anemia; Esophageal dysphagia; Generalized abdominal pain; Iron deficiency; Bloating; Constipation; Nausea; and Vomiting on their problem list. she  has a past medical history of Acute right-sided low back pain, Anemia, Arthritis, Asthma, Complication of anesthesia, Diabetes mellitus without complication (HCC), DVT, lower extremity (HCC) (01/15/2015), GERD (gastroesophageal reflux disease), History of hiatal hernia, Hypertension, Osteoarthritis of lumbar spine, unspecified spinal osteoarthritis complication status, Pulmonary embolism (HCC) (01/15/2015), Rheumatoid factor positive, and Sleep apnea. she  has a past surgical history that includes Heel spur surgery (Bilateral); Breast surgery (2000); Rotator cuff repair (Right, 2009); Tonsillectomy; Tubal ligation; Gastric Roux-En-Y (N/A, 11/01/2015); Reduction mammaplasty (Bilateral, 2000); Cholecystectomy; IVC FILTER INSERTION (N/A, 01/19/2019); Total knee arthroplasty (Right, 01/21/2019); IVC FILTER REMOVAL (04/16/2019); IVC FILTER REMOVAL (N/A, 04/16/2019); Breast biopsy (Bilateral); IVC FILTER INSERTION (N/A, 04/15/2023); Cesarean section; Knee Arthroplasty (Left, 05/15/2023);  and IVC FILTER REMOVAL (N/A, 07/29/2023) are also affecting patient's functional outcome.   REHAB POTENTIAL: Good  CLINICAL DECISION MAKING: Evolving/moderate complexity  EVALUATION COMPLEXITY: Moderate  GOALS: Goals reviewed with patient? No  SHORT TERM GOALS: Target date: 10/20/2024  Patient will be independent with initial home exercise program for self-management of symptoms. Baseline: Initial HEP provided at IE (10/06/24); Goal status: In progress  LONG TERM GOALS: Target date: 12/29/2024  Patient will be independent with a long-term home exercise program for self-management of symptoms.  Baseline: Initial HEP provided at IE (10/06/24); Goal status: In progress  2.  Patient will demonstrate improved Modified Oswestry Disability Index (mODI) to equal or less than 10% to demonstrate improvement in overall condition and self-reported functional ability.  Baseline: 34% (10/06/24); Goal status: In progress  3.  Patient will demonstrate the ability to ambulate 1500 feet without antalgic gait or increased pain during 6 Minute Walk Test to demonstrate improved community ambulation towards age matched norma of 1765 feet for healthy adult female and to help her perform work and leisure activities with less difficulty.   Baseline: currently antalgic gait walking short distances (< 100 feet) in clinic with complaint of pain and difficulty walking down the hallway during work activities (10/06/24); Goal status: In progress  4.  Patient will demonstrate full available lumbar AROM without increased symptoms except intermittent end range discomfort to improve her ability to complete ADLs, work, and leisure activities.  Baseline: painful and limited - see objective (10/06/24); Goal status: In progress  5.  Patient will report NPRS equal or less than 3/10 during functional activities during the last 2 weeks to improve their abilitly to complete community, work and/or recreational activities with  less limitation. Baseline: 8/10 (10/06/24); Goal status: In progress  PLAN:  PT FREQUENCY: 2x/week  PT DURATION: 8-12 weeks  PLANNED INTERVENTIONS: [id number]- PT Re-evaluation, 97750- Physical Performance Testing, 97110-Therapeutic exercises, 97530- Therapeutic activity, [id number]- Neuromuscular re-education, 97535- Self Care, [id number]- Manual therapy, [phone]- Gait training, [phone]- Aquatic Therapy, [phone]- Electrical stimulation (unattended), [phone] (1-2 muscles), [phone] (3+ muscles)- Dry Needling, Patient/Family education, Balance training, Stair training, Cryotherapy, and Moist heat.  PLAN FOR NEXT SESSION: *** She would likely benefit from learning self hip flexor stretch and ability to stabilize lumbar spine away from irritating extension during functional activities, while improving motion in all directions with intermittent stretching.  Vernell Rise, SPT 11/16/2024

## 2024-11-16 ENCOUNTER — Ambulatory Visit: Admitting: Physical Therapy

## 2024-11-16 DIAGNOSIS — M5459 Other low back pain: Secondary | ICD-10-CM | POA: Diagnosis present

## 2024-11-16 DIAGNOSIS — M5416 Radiculopathy, lumbar region: Secondary | ICD-10-CM

## 2024-11-16 DIAGNOSIS — R262 Difficulty in walking, not elsewhere classified: Secondary | ICD-10-CM | POA: Diagnosis present

## 2024-11-17 NOTE — Therapy (Unsigned)
 OUTPATIENT PHYSICAL THERAPY TREATMENT   Patient Name: Erica Powers MRN: [id number] DOB:07/31/1961, 63 y.o., female Today's Date: 11/18/2024  END OF SESSION:  PT End of Session - 11/18/24 1721     Visit Number 4    Number of Visits 17    Date for Recertification  12/29/24    Authorization Type [employer] reporting period from 10/06/2024    Authorization Time Period VL 60 PT/OT-43 remain    Authorization - Number of Visits 43    Progress Note Due on Visit 10    PT Start Time 1733    PT Stop Time 1813    PT Time Calculation (min) 40 min    Activity Tolerance Patient tolerated treatment well    Behavior During Therapy WFL for tasks assessed/performed          Past Medical History:  Diagnosis Date   Acute right-sided low back pain    Anemia    Arthritis    Asthma    Complication of anesthesia    Woke during IVC filter removal   Diabetes mellitus without complication (HCC)    Difficult intubation    pt denies   DVT, lower extremity (HCC) 01/15/2015   right leg that went to lung   GERD (gastroesophageal reflux disease)    History of hiatal hernia    Hypertension    Osteoarthritis of lumbar spine, unspecified spinal osteoarthritis complication status    Pulmonary embolism (HCC) 01/15/2015   Rheumatoid factor positive    Sleep apnea    Past Surgical History:  Procedure Laterality Date   BREAST BIOPSY Bilateral    bilat bx done neg at [facility]    BREAST SURGERY  2000   Reduction   CESAREAN SECTION     CHOLECYSTECTOMY     COLONOSCOPY N/A 04/08/2015   Procedure: COLONOSCOPY;  Surgeon: Rogelia Copping, MD;  Location: [facility];  Service: Gastroenterology;  Laterality: N/A;   COLONOSCOPY WITH PROPOFOL  N/A 09/29/2020   Procedure: COLONOSCOPY WITH PROPOFOL ;  Surgeon: Unk Corinn Skiff, MD;  Location: [facility];  Service: Endoscopy;  Laterality: N/A;   ESOPHAGOGASTRODUODENOSCOPY (EGD) WITH PROPOFOL  N/A 07/07/2015   Procedure:  ESOPHAGOGASTRODUODENOSCOPY (EGD) WITH PROPOFOL ;  Surgeon: Rogelia Copping, MD;  Location: [facility];  Service: Endoscopy;  Laterality: N/A;   ESOPHAGOGASTRODUODENOSCOPY (EGD) WITH PROPOFOL  N/A 09/29/2020   Procedure: ESOPHAGOGASTRODUODENOSCOPY (EGD) WITH PROPOFOL ;  Surgeon: Unk Corinn Skiff, MD;  Location: [facility];  Service: Endoscopy;  Laterality: N/A;   GASTRIC ROUX-EN-Y N/A 11/01/2015   Procedure: LAPAROSCOPIC ROUX-EN-Y GASTRIC, HIATAL HERNIA REPAIR, EXTENSIVE LYSIS OF ADHESIONS;  Surgeon: Ozell DELENA Mano, MD;  Location: [facility];  Service: General;  Laterality: N/A;   HEEL SPUR SURGERY Bilateral    IVC FILTER INSERTION N/A 01/19/2019   Procedure: IVC FILTER INSERTION;  Surgeon: Marea Selinda RAMAN, MD;  Location: [facility];  Service: Cardiovascular;  Laterality: N/A;   IVC FILTER INSERTION N/A 04/15/2023   Procedure: IVC FILTER INSERTION;  Surgeon: Marea Selinda RAMAN, MD;  Location: [facility];  Service: Cardiovascular;  Laterality: N/A;   IVC FILTER REMOVAL  04/16/2019   IVC FILTER REMOVAL N/A 04/16/2019   Procedure: IVC FILTER REMOVAL;  Surgeon: Marea Selinda RAMAN, MD;  Location: [facility];  Service: Cardiovascular;  Laterality: N/A;   IVC FILTER REMOVAL N/A 07/29/2023   Procedure: IVC FILTER REMOVAL;  Surgeon: Marea Selinda RAMAN, MD;  Location: [facility];  Service: Cardiovascular;  Laterality: N/A;  KNEE ARTHROPLASTY Left 05/15/2023   Procedure: COMPUTER ASSISTED TOTAL KNEE ARTHROPLASTY;  Surgeon: Mardee Lynwood SQUIBB, MD;  Location: [facility];  Service: Orthopedics;  Laterality: Left;   REDUCTION MAMMAPLASTY Bilateral 2000   ROTATOR CUFF REPAIR Right 2009   TONSILLECTOMY     TOTAL KNEE ARTHROPLASTY Right 01/21/2019   Procedure: TOTAL KNEE ARTHROPLASTY with navigation;  Surgeon: Mardee Lynwood SQUIBB, MD;  Location: [facility];  Service: Orthopedics;  Laterality: Right;   TUBAL LIGATION     Patient Active Problem List   Diagnosis Date Noted   Esophageal  dysphagia 01/04/2022   Iron deficiency 01/04/2022   Vomiting 01/04/2022   Constipation 03/24/2021   Bloating 03/22/2021   Nausea 03/22/2021   Iron deficiency anemia    BMI 33.0-33.9,adult 09/08/2020   Panniculitis 03/23/2020   Generalized abdominal pain 10/08/2019   History of Roux-en-Y gastric bypass 09/16/2019   Chronic shoulder bursitis, right 05/06/2019   Chronic right shoulder pain 04/20/2019   S/P IVC filter 03/03/2019   Status post total left knee replacement 01/21/2019   DDD (degenerative disc disease), lumbar 03/31/2018   Rheumatoid factor positive 03/31/2018   Swelling of limb 07/12/2017   Varicose veins of leg with pain, bilateral 07/12/2017   Pulmonary emboli (HCC) 11/15/2015   Positive Helicobacter pylori serology 11/03/2015   Bariatric surgery status 11/01/2015   Primary osteoarthritis of left knee 09/27/2015   Primary osteoarthritis of right knee 09/27/2015   Diabetes mellitus, type 2 (HCC) 07/13/2015   Arthritis, degenerative 07/13/2015   Hypertensive disorder 06/15/2015   Asthma 12/07/2013   Carpal tunnel syndrome 12/07/2013   Essential (primary) hypertension 09/14/2009   PCP: Tobie Domino, MD  REFERRING PROVIDER: Janith Drivers, DO  REFERRING DIAG: left lumbar radiculitis  Rationale for Evaluation and Treatment: Rehabilitation  THERAPY DIAG:  Other low back pain  Radiculopathy, lumbar region  Difficulty in walking, not elsewhere classified  ONSET DATE: following L TKA on 05/15/2023  SUBJECTIVE:                                                                                                                                                                                           PERTINENT HISTORY:  Patient is a 63 y.o. female who presents to outpatient physical therapy with a referral for medical diagnosis left lumbar radiculitis. This patient's chief complaints consist of left low back pain radiating with pain and numbness into left lower leg and  difficulty bending to the right leading to the following functional deficits: difficulty with usual activities including working, standing up from sitting, standing, prolonged sitting, hiking, biking, sleeping, lifting, self care, housework, cooking, leisure activities. Relevant past medical  history and comorbidities include the following: she has Diabetes mellitus, type 2 (HCC); Arthritis, degenerative; Bariatric surgery status; Pulmonary emboli (HCC); Swelling of limb; Varicose veins of leg with pain, bilateral; Positive Helicobacter pylori serology; Asthma; Carpal tunnel syndrome; DDD (degenerative disc disease), lumbar; Essential (primary) hypertension; Hypertensive disorder; Primary osteoarthritis of left knee; Primary osteoarthritis of right knee; Rheumatoid factor positive; Status post total left knee replacement; S/P IVC filter; BMI 33.0-33.9,adult; Chronic right shoulder pain; Chronic shoulder bursitis, right; History of Roux-en-Y gastric bypass; Panniculitis; Iron deficiency anemia; Esophageal dysphagia; Generalized abdominal pain; Iron deficiency; Bloating; Constipation; Nausea; and Vomiting on their problem list. she  has a past medical history of Acute right-sided low back pain, Anemia, Arthritis, Asthma, Complication of anesthesia, Diabetes mellitus without complication (HCC), DVT, lower extremity (HCC) (01/15/2015), GERD (gastroesophageal reflux disease), History of hiatal hernia, Hypertension, Osteoarthritis of lumbar spine, unspecified spinal osteoarthritis complication status, Pulmonary embolism (HCC) (01/15/2015), Rheumatoid factor positive, and Sleep apnea. she  has a past surgical history that includes Heel spur surgery (Bilateral); Breast surgery (2000); Rotator cuff repair (Right, 2009); Tonsillectomy; Tubal ligation; Gastric Roux-En-Y (N/A, 11/01/2015); Reduction mammaplasty (Bilateral, 2000); Cholecystectomy; IVC FILTER INSERTION (N/A, 01/19/2019); Total knee arthroplasty (Right,  01/21/2019); IVC FILTER REMOVAL (04/16/2019); IVC FILTER REMOVAL (N/A, 04/16/2019); Breast biopsy (Bilateral); IVC FILTER INSERTION (N/A, 04/15/2023); Cesarean section; Knee Arthroplasty (Left, 05/15/2023); and IVC FILTER REMOVAL (N/A, 07/29/2023). Patient denies hx of cancer, stroke, seizures, lung problems, heart problems, unexplained weight loss, unexplained changes in bowel or bladder problems, unexplained stumbling or dropping things, and spinal surgery  Exercise history: Biking 20 miles a week with a group prior to L TKA, hiking  SUBJECTIVE STATEMENT: Patient stated she has been feeling stiff after our last session. Was doing her HEP and noticed a catch still when going to the L side.  PAIN: NPRS: pt denies pain just feels stiff in her back  From initial PT evaluation on 10/06/2024:  NPRS: Current: 5/10,  Best: 3/10, Worst: 8/10. Pain location: pain left lower back, left glute, down back of leg, intermittently to medial lower leg. Numbness in L lower leg (stops mid lower leg). Pain description: pinching in the left lumbar spine radiating to the L leg, Numbness in L lower leg (stops mid lower leg). Aggravating factors: see below Relieving factors: see below   PRECAUTIONS: None  WEIGHT BEARING RESTRICTIONS: No  FALLS:  Has patient fallen in last 6 months? Yes. Number of falls 1 when she had her son over and she wanted to clean under her matress. She tripped over the the bed frame when stepping in to vacuum.   PATIENT GOALS: To really work on walking and get me back up, to loosen up my back and knee, so I can get back mobile and moving like I was  OBJECTIVE  TREATMENT   Manual therapy: to reduce pain and tissue tension, improve range of motion, neuromodulation, in order to promote improved ability to complete functional activities.   STM to posterior musculature of erector spinae around L4-L5   Noted tightness on the L side compared to the R  Therapeutic exercise: therapeutic exercises that incorporate ONE parameter at one or more areas of the body to centralize symptoms, develop strength and endurance, range of motion, and flexibility required for successful completion of functional activities.  Standing B hip flexor stretch  1 x 30 seconds each side  Supine B piriformis  1 x 30 seconds each side  Standing hip Abductions to strengthening LE    2 x 10 each side   Cued to stand up tall and not shift her weight   Hooklying LTR to reduce tension in low back   1 x 10 each side   Seated lumbar rollouts 3 ways to help reduce tension in low back   1 x 2 min in flexion   1 x 2 min R   1 x 2 min L  Neuromuscular Re-education: a technique or exercise performed with the goal of improving the level of communication between the body and the brain, such as for balance, motor control, muscle activation patterns, coordination, desensitization, quality of muscle contraction, proprioception, and/or kinesthetic sense needed for successful and safe completion of functional activities.   Hooklying bridge with PPT in a pain free range of motion for improved lumbo pelvic control in    2 x 10  Hooklying PPT with B arm raise for maintained motor control of pelvis with movement of UE   2 x 10   Added to HEP  Standing resisted pallof press with GTB and with PPT to improve core strengthen while maintain lumbo pelvic control   2 x 10 each side with GTB and 3 seconds hold    Added to HEP  Standing hip ext leaning on mat table to improve LE strength while maintaining lumbopelvic control   2 x 10    Added to HEP    Pt required multimodal cuing for proper technique and to facilitate improved neuromuscular control, strength, range of motion, and functional ability resulting in improved performance and form.  40 min total: 5 min MT, 16 min TE, 19 min NR   PATIENT  EDUCATION:  Education details: educated on peripheralization, centralization, POC, avoiding flexion, avoiding nerve tension positions, HEP Person educated: Patient Education method: Programmer, Multimedia, Demonstration, and Handouts Education comprehension: verbalized understanding, returned demonstration, and needs further education  HOME EXERCISE PROGRAM: Access Code: [email or web address] URL: [email or web address] Date: 11/18/2024 Prepared by: Vernell Rise  Exercises - Seated Correct Posture  - Supine Posterior Pelvic Tilt with Shoulder Flexion  - 1 x daily - 2 sets - 10 reps - Supine Posterior Pelvic Tilt  - 1 x daily - 2 sets - 10 reps - Standing Hip Flexor Stretch  - 1 x daily - 2 sets - 15 second hold - Supine Piriformis Stretch  - 1 x daily - 2 sets - 30 seconds hold - Standing Anti-Rotation Press with Anchored Resistance  - 1 x daily - 2 sets - 10 reps - Standing Lateral Toe Touches  - 1 x daily - 2 sets - 10 reps  ASSESSMENT:  CLINICAL IMPRESSION: Patient was cued with hooklying PPT with arm raises to keep back flat on the mat table to avoid going into extension. Was able to maintain proper posturing without the cue for the second set. Patient noted increased tightness in R piriformis compared to the L. Added hooklying LTR to help reduce the tension in the low back and patient stated she felt a good stretch with this exercise. Added standing hip abductions to help strengthen her lateral hips. Patient was cued to  stand up tall and not shift her weight, found it easier to completed sided hip abductions with her L side against the mat table. Patient noted it was harder to lift her R leg compared to the L. Added standing hip extensions leaning on mat table to help strengthening her glutes. Patient noted she could feel the muscle fatiguing as she went on. STM to posterior musculature of the spine revealed muscle guarding of erector spinae on the L side around L4-L5. Completed lumbar 3 way  roll outs to help reduce tension of spinal muscles, patient reported a good stretch and occasional catch when returning from flexion. Patient was cued to return to neutral slowly and the catch no longer happened. Patient reported no pain upon completion of session.Patient would benefit from continued management of limiting condition by skilled physical therapist to address remaining impairments and functional limitations to work towards stated goals and return to PLOF or maximal functional independence.    MDT Assessment Provisional Classification: OTHER: spinal stenosis vs inflammatory arthropathy vs unclassified.   From initial PT evaluation on 10/06/2024:  Patient is a 63 y.o. female referred to outpatient physical therapy with a medical diagnosis of left lumbar radiculitis and request for PT evaluation using Mechanical Diagnosis and Therapy (MDT/McKenzie) who presents with signs and symptoms consistent with chronic left sided low back pain with L radiculopathy. MDT provisional classification of lumbar derangement syndrome with extension preference vs flexion preference vs OTHER. Patient presents with significant pain, ROM, joint stiffness, neural tension, paresthesia, muscle performance (strength/power/endurance), gait, posture, balance, and activity tolerance impairments that are limiting ability to complete usual activities including working, standing up from sitting, standing, prolonged sitting, hiking, biking, sleeping, lifting, self care, housework, cooking, leisure activities without difficulty. Patient will benefit from skilled physical therapy intervention to address current body structure impairments and activity limitations to improve function and work towards goals set in current POC in order to return to prior level of function or maximal functional improvement.   OBJECTIVE IMPAIRMENTS: Abnormal gait, decreased activity tolerance, decreased balance, decreased endurance, decreased knowledge of  condition, decreased mobility, difficulty walking, decreased ROM, decreased strength, hypomobility, increased muscle spasms, impaired flexibility, improper body mechanics, postural dysfunction, and pain.   ACTIVITY LIMITATIONS: carrying, lifting, bending, sitting, standing, squatting, sleeping, stairs, transfers, bed mobility, and locomotion level  PARTICIPATION LIMITATIONS: meal prep, cleaning, laundry, interpersonal relationship, driving, shopping, community activity, occupation, and cycling, hiking  PERSONAL FACTORS: Age, Past/current experiences, Time since onset of injury/illness/exacerbation, and 3+ comorbidities:  Diabetes mellitus, type 2 (HCC); Arthritis, degenerative; Bariatric surgery status; Pulmonary emboli (HCC); Swelling of limb; Varicose veins of leg with pain, bilateral; Positive Helicobacter pylori serology; Asthma; Carpal tunnel syndrome; DDD (degenerative disc disease), lumbar; Essential (primary) hypertension; Hypertensive disorder; Primary osteoarthritis of left knee; Primary osteoarthritis of right knee; Rheumatoid factor positive; Status post total left knee replacement; S/P IVC filter; BMI 33.0-33.9,adult; Chronic right shoulder pain; Chronic shoulder bursitis, right; History of Roux-en-Y gastric bypass; Panniculitis; Iron deficiency anemia; Esophageal dysphagia; Generalized abdominal pain; Iron deficiency; Bloating; Constipation; Nausea; and Vomiting on their problem list. she  has a past medical history of Acute right-sided low back pain, Anemia, Arthritis, Asthma, Complication of anesthesia, Diabetes mellitus without complication (HCC), DVT, lower extremity (HCC) (01/15/2015), GERD (gastroesophageal reflux disease), History of hiatal hernia, Hypertension, Osteoarthritis of lumbar spine, unspecified spinal osteoarthritis complication status, Pulmonary embolism (HCC) (01/15/2015), Rheumatoid factor positive, and Sleep apnea. she  has a past surgical history that includes Heel spur  surgery (Bilateral); Breast surgery (2000);  Rotator cuff repair (Right, 2009); Tonsillectomy; Tubal ligation; Gastric Roux-En-Y (N/A, 11/01/2015); Reduction mammaplasty (Bilateral, 2000); Cholecystectomy; IVC FILTER INSERTION (N/A, 01/19/2019); Total knee arthroplasty (Right, 01/21/2019); IVC FILTER REMOVAL (04/16/2019); IVC FILTER REMOVAL (N/A, 04/16/2019); Breast biopsy (Bilateral); IVC FILTER INSERTION (N/A, 04/15/2023); Cesarean section; Knee Arthroplasty (Left, 05/15/2023); and IVC FILTER REMOVAL (N/A, 07/29/2023) are also affecting patient's functional outcome.   REHAB POTENTIAL: Good  CLINICAL DECISION MAKING: Evolving/moderate complexity  EVALUATION COMPLEXITY: Moderate  GOALS: Goals reviewed with patient? No  SHORT TERM GOALS: Target date: 10/20/2024  Patient will be independent with initial home exercise program for self-management of symptoms. Baseline: Initial HEP provided at IE (10/06/24); Goal status: In progress  LONG TERM GOALS: Target date: 12/29/2024  Patient will be independent with a long-term home exercise program for self-management of symptoms.  Baseline: Initial HEP provided at IE (10/06/24); Goal status: In progress  2.  Patient will demonstrate improved Modified Oswestry Disability Index (mODI) to equal or less than 10% to demonstrate improvement in overall condition and self-reported functional ability.  Baseline: 34% (10/06/24); Goal status: In progress  3.  Patient will demonstrate the ability to ambulate 1500 feet without antalgic gait or increased pain during 6 Minute Walk Test to demonstrate improved community ambulation towards age matched norma of 1765 feet for healthy adult female and to help her perform work and leisure activities with less difficulty.   Baseline: currently antalgic gait walking short distances (< 100 feet) in clinic with complaint of pain and difficulty walking down the hallway during work activities (10/06/24); Goal status: In  progress  4.  Patient will demonstrate full available lumbar AROM without increased symptoms except intermittent end range discomfort to improve her ability to complete ADLs, work, and leisure activities.  Baseline: painful and limited - see objective (10/06/24); Goal status: In progress  5.  Patient will report NPRS equal or less than 3/10 during functional activities during the last 2 weeks to improve their abilitly to complete community, work and/or recreational activities with less limitation. Baseline: 8/10 (10/06/24); Goal status: In progress  PLAN:  PT FREQUENCY: 2x/week  PT DURATION: 8-12 weeks  PLANNED INTERVENTIONS: 97164- PT Re-evaluation, 97750- Physical Performance Testing, 97110-Therapeutic exercises, 97530- Therapeutic activity, [id number]- Neuromuscular re-education, 97535- Self Care, [id number]- Manual therapy, [phone]- Gait training, [phone]- Aquatic Therapy, [phone]- Electrical stimulation (unattended), [phone] (1-2 muscles), [phone] (3+ muscles)- Dry Needling, Patient/Family education, Balance training, Stair training, Cryotherapy, and Moist heat.  PLAN FOR NEXT SESSION:  Consider adding more LE strengthening and quadruped hip hikes if tolerated. continue to stabilize lumbar spine away from irritating extension during functional activities, while improving motion in all directions with intermittent stretching. Update HEP as appropriate. Manual therapy as needed.   Vernell Rise, SPT 11/18/2024

## 2024-11-18 ENCOUNTER — Ambulatory Visit: Admitting: Physical Therapy

## 2024-11-18 DIAGNOSIS — M5416 Radiculopathy, lumbar region: Secondary | ICD-10-CM

## 2024-11-18 DIAGNOSIS — M5459 Other low back pain: Secondary | ICD-10-CM

## 2024-11-18 DIAGNOSIS — R262 Difficulty in walking, not elsewhere classified: Secondary | ICD-10-CM

## 2024-11-19 ENCOUNTER — Encounter: Payer: Self-pay | Admitting: Physical Therapy

## 2024-11-23 ENCOUNTER — Ambulatory Visit: Admitting: Physical Therapy

## 2024-11-30 ENCOUNTER — Ambulatory Visit: Admitting: Physical Therapy

## 2024-12-02 ENCOUNTER — Ambulatory Visit: Admitting: Physical Therapy

## 2024-12-07 ENCOUNTER — Encounter: Admitting: Physical Therapy

## 2024-12-09 ENCOUNTER — Encounter: Admitting: Physical Therapy

## 2024-12-14 ENCOUNTER — Ambulatory Visit: Admitting: Physical Therapy

## 2024-12-16 ENCOUNTER — Ambulatory Visit: Admitting: Physical Therapy

## 2024-12-23 ENCOUNTER — Ambulatory Visit: Admitting: Physical Therapy
